# Patient Record
Sex: Female | Born: 1980 | Race: White | Hispanic: Yes | Marital: Married | Smoking: Former smoker
Health system: Southern US, Community
[De-identification: ages and names within clinical notes are randomized; demographics above are authoritative.]

## PROBLEM LIST (undated history)

## (undated) DIAGNOSIS — K219 Gastro-esophageal reflux disease without esophagitis: Secondary | ICD-10-CM

## (undated) HISTORY — PX: ABDOMINAL HYSTERECTOMY: SHX81

## (undated) HISTORY — PX: OTHER SURGICAL HISTORY: SHX169

## (undated) HISTORY — PX: APPENDECTOMY: SHX54

## (undated) HISTORY — PX: TONSILLECTOMY: SUR1361

---

## 2003-01-28 ENCOUNTER — Emergency Department (HOSPITAL_COMMUNITY): Admission: EM | Admit: 2003-01-28 | Discharge: 2003-01-28 | Payer: Self-pay | Admitting: Internal Medicine

## 2004-09-16 ENCOUNTER — Emergency Department (HOSPITAL_COMMUNITY): Admission: EM | Admit: 2004-09-16 | Discharge: 2004-09-17 | Payer: Self-pay | Admitting: Emergency Medicine

## 2004-10-20 ENCOUNTER — Emergency Department (HOSPITAL_COMMUNITY): Admission: EM | Admit: 2004-10-20 | Discharge: 2004-10-20 | Payer: Self-pay | Admitting: Emergency Medicine

## 2005-03-26 ENCOUNTER — Emergency Department (HOSPITAL_COMMUNITY): Admission: EM | Admit: 2005-03-26 | Discharge: 2005-03-27 | Payer: Self-pay | Admitting: Emergency Medicine

## 2006-07-19 ENCOUNTER — Emergency Department (HOSPITAL_COMMUNITY): Admission: EM | Admit: 2006-07-19 | Discharge: 2006-07-19 | Payer: Self-pay | Admitting: Emergency Medicine

## 2006-07-26 ENCOUNTER — Emergency Department (HOSPITAL_COMMUNITY): Admission: EM | Admit: 2006-07-26 | Discharge: 2006-07-27 | Payer: Self-pay | Admitting: Emergency Medicine

## 2006-08-10 ENCOUNTER — Emergency Department (HOSPITAL_COMMUNITY): Admission: EM | Admit: 2006-08-10 | Discharge: 2006-08-11 | Payer: Self-pay | Admitting: Emergency Medicine

## 2006-08-11 ENCOUNTER — Emergency Department (HOSPITAL_COMMUNITY): Admission: EM | Admit: 2006-08-11 | Discharge: 2006-08-11 | Payer: Self-pay | Admitting: Emergency Medicine

## 2007-04-19 ENCOUNTER — Ambulatory Visit: Payer: Self-pay | Admitting: Internal Medicine

## 2007-04-19 ENCOUNTER — Ambulatory Visit: Payer: Self-pay | Admitting: *Deleted

## 2007-04-20 ENCOUNTER — Encounter (INDEPENDENT_AMBULATORY_CARE_PROVIDER_SITE_OTHER): Payer: Self-pay | Admitting: Internal Medicine

## 2007-09-20 ENCOUNTER — Emergency Department (HOSPITAL_COMMUNITY): Admission: EM | Admit: 2007-09-20 | Discharge: 2007-09-21 | Payer: Self-pay | Admitting: Emergency Medicine

## 2007-11-28 ENCOUNTER — Emergency Department (HOSPITAL_COMMUNITY): Admission: EM | Admit: 2007-11-28 | Discharge: 2007-11-29 | Payer: Self-pay | Admitting: Emergency Medicine

## 2007-12-22 ENCOUNTER — Emergency Department (HOSPITAL_COMMUNITY): Admission: EM | Admit: 2007-12-22 | Discharge: 2007-12-22 | Payer: Self-pay | Admitting: Emergency Medicine

## 2008-02-21 ENCOUNTER — Emergency Department (HOSPITAL_COMMUNITY): Admission: EM | Admit: 2008-02-21 | Discharge: 2008-02-22 | Payer: Self-pay | Admitting: Emergency Medicine

## 2008-07-14 ENCOUNTER — Emergency Department (HOSPITAL_COMMUNITY): Admission: EM | Admit: 2008-07-14 | Discharge: 2008-07-14 | Payer: Self-pay | Admitting: Emergency Medicine

## 2008-08-21 ENCOUNTER — Emergency Department (HOSPITAL_COMMUNITY): Admission: EM | Admit: 2008-08-21 | Discharge: 2008-08-21 | Payer: Self-pay | Admitting: Emergency Medicine

## 2009-03-26 ENCOUNTER — Emergency Department (HOSPITAL_COMMUNITY): Admission: EM | Admit: 2009-03-26 | Discharge: 2009-03-26 | Payer: Self-pay | Admitting: Family Medicine

## 2009-05-17 ENCOUNTER — Inpatient Hospital Stay (HOSPITAL_COMMUNITY): Admission: EM | Admit: 2009-05-17 | Discharge: 2009-05-21 | Payer: Self-pay | Admitting: Emergency Medicine

## 2009-05-17 ENCOUNTER — Ambulatory Visit: Payer: Self-pay | Admitting: Internal Medicine

## 2009-05-19 ENCOUNTER — Encounter: Payer: Self-pay | Admitting: Internal Medicine

## 2009-05-20 ENCOUNTER — Encounter (INDEPENDENT_AMBULATORY_CARE_PROVIDER_SITE_OTHER): Payer: Self-pay | Admitting: Internal Medicine

## 2009-05-31 ENCOUNTER — Encounter (INDEPENDENT_AMBULATORY_CARE_PROVIDER_SITE_OTHER): Payer: Self-pay | Admitting: Adult Health

## 2009-05-31 ENCOUNTER — Ambulatory Visit: Payer: Self-pay | Admitting: Family Medicine

## 2009-05-31 LAB — CONVERTED CEMR LAB
ALT: <8 units/L (ref 0–35)
AST: 12 units/L (ref 0–37)
BUN: 11 mg/dL (ref 6–23)
Basophils Relative: 1 % (ref 0–1)
CO2: 25 meq/L (ref 19–32)
Calcium: 9.3 mg/dL (ref 8.4–10.5)
Chloride: 102 meq/L (ref 96–112)
Creatinine, Ser: 1.03 mg/dL (ref 0.40–1.20)
Eosinophils Absolute: 0.1 10*3/uL (ref 0.0–0.7)
Eosinophils Relative: 1 % (ref 0–5)
HCT: 37.1 % (ref 36.0–46.0)
Lymphs Abs: 2.4 10*3/uL (ref 0.7–4.0)
MCHC: 31.8 g/dL (ref 30.0–36.0)
MCV: 84.7 fL (ref 78.0–100.0)
Neutrophils Relative %: 63 % (ref 43–77)
Platelets: 380 10*3/uL (ref 150–400)
Total Bilirubin: 0.3 mg/dL (ref 0.3–1.2)

## 2009-06-01 ENCOUNTER — Encounter (INDEPENDENT_AMBULATORY_CARE_PROVIDER_SITE_OTHER): Payer: Self-pay | Admitting: Adult Health

## 2009-08-18 ENCOUNTER — Emergency Department (HOSPITAL_COMMUNITY): Admission: EM | Admit: 2009-08-18 | Discharge: 2009-08-18 | Payer: Self-pay | Admitting: Emergency Medicine

## 2010-09-07 ENCOUNTER — Emergency Department (HOSPITAL_COMMUNITY)
Admission: EM | Admit: 2010-09-07 | Discharge: 2010-09-07 | Payer: Self-pay | Source: Home / Self Care | Admitting: Emergency Medicine

## 2010-09-08 ENCOUNTER — Emergency Department (HOSPITAL_COMMUNITY)
Admission: EM | Admit: 2010-09-08 | Discharge: 2010-09-08 | Payer: Self-pay | Source: Home / Self Care | Admitting: Emergency Medicine

## 2010-10-11 ENCOUNTER — Encounter (INDEPENDENT_AMBULATORY_CARE_PROVIDER_SITE_OTHER): Payer: Self-pay | Admitting: *Deleted

## 2010-12-04 ENCOUNTER — Emergency Department (HOSPITAL_COMMUNITY)
Admission: EM | Admit: 2010-12-04 | Discharge: 2010-12-04 | Disposition: A | Discharge status: Home / Self Care | Payer: Self-pay | Attending: Emergency Medicine | Admitting: Emergency Medicine

## 2010-12-04 DIAGNOSIS — M545 Low back pain, unspecified: Secondary | ICD-10-CM | POA: Insufficient documentation

## 2010-12-04 DIAGNOSIS — R350 Frequency of micturition: Secondary | ICD-10-CM | POA: Insufficient documentation

## 2010-12-04 DIAGNOSIS — M549 Dorsalgia, unspecified: Secondary | ICD-10-CM | POA: Insufficient documentation

## 2010-12-04 LAB — URINALYSIS, ROUTINE W REFLEX MICROSCOPIC
Nitrite: NEGATIVE
Specific Gravity, Urine: 1.02 (ref 1.005–1.030)
Urobilinogen, UA: 0.2 mg/dL (ref 0.0–1.0)
pH: 5.5 (ref 5.0–8.0)

## 2010-12-04 LAB — POCT I-STAT, CHEM 8
Calcium, Ion: 1.11 mmol/L — ABNORMAL LOW (ref 1.12–1.32)
Creatinine, Ser: 0.9 mg/dL (ref 0.4–1.2)
Glucose, Bld: 95 mg/dL (ref 70–99)
HCT: 41 % (ref 36.0–46.0)
Hemoglobin: 13.9 g/dL (ref 12.0–15.0)
TCO2: 24 mmol/L (ref 0–100)

## 2010-12-04 LAB — CBC
HCT: 41.3 % (ref 36.0–46.0)
MCHC: 32.9 g/dL (ref 30.0–36.0)
Platelets: 228 10*3/uL (ref 150–400)
RDW: 12.8 % (ref 11.5–15.5)
WBC: 6 10*3/uL (ref 4.0–10.5)

## 2010-12-04 LAB — DIFFERENTIAL
Basophils Absolute: 0 10*3/uL (ref 0.0–0.1)
Basophils Relative: 0 % (ref 0–1)
Eosinophils Absolute: 0.1 10*3/uL (ref 0.0–0.7)
Eosinophils Relative: 2 % (ref 0–5)
Monocytes Absolute: 0.5 10*3/uL (ref 0.1–1.0)

## 2010-12-04 LAB — URINE MICROSCOPIC-ADD ON

## 2010-12-04 LAB — WET PREP, GENITAL
Trich, Wet Prep: NONE SEEN
Yeast Wet Prep HPF POC: NONE SEEN

## 2010-12-04 LAB — POCT PREGNANCY, URINE: Preg Test, Ur: NEGATIVE

## 2010-12-05 LAB — GC/CHLAMYDIA PROBE AMP, GENITAL: Chlamydia, DNA Probe: NEGATIVE

## 2010-12-10 LAB — URINALYSIS, ROUTINE W REFLEX MICROSCOPIC
Bilirubin Urine: NEGATIVE
Glucose, UA: NEGATIVE mg/dL
Nitrite: NEGATIVE
Specific Gravity, Urine: 1.024 (ref 1.005–1.030)
pH: 6.5 (ref 5.0–8.0)

## 2010-12-10 LAB — URINE MICROSCOPIC-ADD ON

## 2010-12-10 LAB — WET PREP, GENITAL

## 2010-12-10 LAB — POCT PREGNANCY, URINE: Preg Test, Ur: NEGATIVE

## 2010-12-13 LAB — BASIC METABOLIC PANEL
BUN: 14 mg/dL (ref 6–23)
BUN: 2 mg/dL — ABNORMAL LOW (ref 6–23)
BUN: 4 mg/dL — ABNORMAL LOW (ref 6–23)
CO2: 25 mEq/L (ref 19–32)
CO2: 26 mEq/L (ref 19–32)
CO2: 26 mEq/L (ref 19–32)
Calcium: 7.5 mg/dL — ABNORMAL LOW (ref 8.4–10.5)
Calcium: 7.6 mg/dL — ABNORMAL LOW (ref 8.4–10.5)
Calcium: 7.8 mg/dL — ABNORMAL LOW (ref 8.4–10.5)
Calcium: 7.9 mg/dL — ABNORMAL LOW (ref 8.4–10.5)
Chloride: 105 mEq/L (ref 96–112)
Chloride: 107 mEq/L (ref 96–112)
Chloride: 109 mEq/L (ref 96–112)
Chloride: 110 mEq/L (ref 96–112)
Creatinine, Ser: 0.7 mg/dL (ref 0.4–1.2)
Creatinine, Ser: 0.71 mg/dL (ref 0.4–1.2)
Creatinine, Ser: 1.08 mg/dL (ref 0.4–1.2)
Creatinine, Ser: 1.21 mg/dL — ABNORMAL HIGH (ref 0.4–1.2)
GFR calc Af Amer: >60 mL/min (ref >60)
GFR calc Af Amer: >60 mL/min (ref >60)
GFR calc non Af Amer: 60 mL/min — ABNORMAL LOW (ref >60)
GFR calc non Af Amer: >60 mL/min (ref >60)
GFR calc non Af Amer: >60 mL/min (ref >60)
Glucose, Bld: 103 mg/dL — ABNORMAL HIGH (ref 70–99)
Glucose, Bld: 103 mg/dL — ABNORMAL HIGH (ref 70–99)
Glucose, Bld: 107 mg/dL — ABNORMAL HIGH (ref 70–99)
Glucose, Bld: 123 mg/dL — ABNORMAL HIGH (ref 70–99)
Glucose, Bld: 138 mg/dL — ABNORMAL HIGH (ref 70–99)
Potassium: 2.8 mEq/L — ABNORMAL LOW (ref 3.5–5.1)
Potassium: 3.4 mEq/L — ABNORMAL LOW (ref 3.5–5.1)
Sodium: 136 mEq/L (ref 135–145)
Sodium: 137 mEq/L (ref 135–145)
Sodium: 140 mEq/L (ref 135–145)
Sodium: 142 mEq/L (ref 135–145)

## 2010-12-13 LAB — RETICULOCYTES
RBC.: 4.29 MIL/uL (ref 3.87–5.11)
Retic Count, Absolute: 17.2 10*3/uL — ABNORMAL LOW (ref 19.0–186.0)

## 2010-12-13 LAB — BLOOD GAS, ARTERIAL
Acid-Base Excess: 0.3 mmol/L (ref 0.0–2.0)
Bicarbonate: 23.8 mEq/L (ref 20.0–24.0)
FIO2: 0.21 %
O2 Saturation: 96.4 %
Patient temperature: 98.6
TCO2: 24.9 mmol/L (ref 0–100)
pCO2 arterial: 34.6 mmHg — ABNORMAL LOW (ref 35.0–45.0)
pH, Arterial: 7.451 — ABNORMAL HIGH (ref 7.350–7.400)
pO2, Arterial: 80.2 mmHg (ref 80.0–100.0)

## 2010-12-13 LAB — COMPREHENSIVE METABOLIC PANEL
ALT: <8 U/L (ref 0–35)
Albumin: 2.2 g/dL — ABNORMAL LOW (ref 3.5–5.2)
Alkaline Phosphatase: 55 U/L (ref 39–117)
BUN: 3 mg/dL — ABNORMAL LOW (ref 6–23)
Calcium: 8.5 mg/dL (ref 8.4–10.5)
Glucose, Bld: 89 mg/dL (ref 70–99)
Potassium: 3.3 mEq/L — ABNORMAL LOW (ref 3.5–5.1)
Sodium: 139 mEq/L (ref 135–145)
Total Protein: 6.4 g/dL (ref 6.0–8.3)

## 2010-12-13 LAB — CARDIAC PANEL(CRET KIN+CKTOT+MB+TROPI)
CK, MB: 0.4 ng/mL (ref 0.3–4.0)
CK, MB: 0.4 ng/mL (ref 0.3–4.0)
CK, MB: 0.5 ng/mL (ref 0.3–4.0)
CK, MB: 0.5 ng/mL (ref 0.3–4.0)
Relative Index: INVALID (ref 0.0–2.5)
Total CK: 27 U/L (ref 7–177)
Total CK: 32 U/L (ref 7–177)
Total CK: 33 U/L (ref 7–177)
Total CK: 36 U/L (ref 7–177)
Troponin I: 0.02 ng/mL (ref 0.00–0.06)
Troponin I: 0.03 ng/mL (ref 0.00–0.06)

## 2010-12-13 LAB — APTT: aPTT: 37 seconds (ref 24–37)

## 2010-12-13 LAB — CULTURE, BLOOD (ROUTINE X 2)
Culture: NO GROWTH
Culture: NO GROWTH

## 2010-12-13 LAB — IRON AND TIBC: Iron: <10 ug/dL — ABNORMAL LOW (ref 42–135)

## 2010-12-13 LAB — CBC
HCT: 26.7 % — ABNORMAL LOW (ref 36.0–46.0)
HCT: 33.3 % — ABNORMAL LOW (ref 36.0–46.0)
Hemoglobin: 10.6 g/dL — ABNORMAL LOW (ref 12.0–15.0)
Hemoglobin: 11.4 g/dL — ABNORMAL LOW (ref 12.0–15.0)
Hemoglobin: 9 g/dL — ABNORMAL LOW (ref 12.0–15.0)
MCHC: 33.6 g/dL (ref 30.0–36.0)
MCHC: 33.9 g/dL (ref 30.0–36.0)
MCHC: 34 g/dL (ref 30.0–36.0)
MCHC: 34.3 g/dL (ref 30.0–36.0)
MCV: 82.2 fL (ref 78.0–100.0)
MCV: 82.7 fL (ref 78.0–100.0)
MCV: 83.1 fL (ref 78.0–100.0)
Platelets: 163 10*3/uL (ref 150–400)
Platelets: 216 10*3/uL (ref 150–400)
Platelets: 367 10*3/uL (ref 150–400)
RBC: 3.22 MIL/uL — ABNORMAL LOW (ref 3.87–5.11)
RBC: 4.02 MIL/uL (ref 3.87–5.11)
RDW: 13.1 % (ref 11.5–15.5)
RDW: 13.3 % (ref 11.5–15.5)
RDW: 13.3 % (ref 11.5–15.5)
RDW: 13.4 % (ref 11.5–15.5)
RDW: 13.7 % (ref 11.5–15.5)
WBC: 6.8 10*3/uL (ref 4.0–10.5)
WBC: 6.9 10*3/uL (ref 4.0–10.5)

## 2010-12-13 LAB — ANTIPHOSPHOLIPID SYNDROME EVAL, BLD
Anticardiolipin IgG: <10 GPL U/mL — ABNORMAL LOW (ref <10)
DRVVT: 59.5 secs — ABNORMAL HIGH (ref 34.7–40.5)
Drvvt confirmation: 1.46 Ratio — ABNORMAL HIGH (ref <1.18)
Lupus Anticoagulant: DETECTED — AB
PTTLA 4:1 Mix: 48.3 secs (ref 36.3–48.8)
PTTLA Confirmation: 14.2 secs — ABNORMAL HIGH (ref <8.0)
dRVVT Incubated 1:1 Mix: 42.2 secs (ref 36.1–47.0)

## 2010-12-13 LAB — URINE CULTURE

## 2010-12-13 LAB — DIFFERENTIAL
Basophils Absolute: 0 10*3/uL (ref 0.0–0.1)
Basophils Relative: 0 % (ref 0–1)
Lymphocytes Relative: 12 % (ref 12–46)
Monocytes Absolute: 0.3 10*3/uL (ref 0.1–1.0)
Neutro Abs: 5.3 10*3/uL (ref 1.7–7.7)
Neutrophils Relative %: 83 % — ABNORMAL HIGH (ref 43–77)

## 2010-12-13 LAB — HEPATIC FUNCTION PANEL
Bilirubin, Direct: 0.1 mg/dL (ref 0.0–0.3)
Indirect Bilirubin: 0.1 mg/dL — ABNORMAL LOW (ref 0.3–0.9)
Total Protein: 5.2 g/dL — ABNORMAL LOW (ref 6.0–8.3)

## 2010-12-13 LAB — TSH: TSH: 1.542 u[IU]/mL (ref 0.350–4.500)

## 2010-12-13 LAB — HEMOGLOBIN A1C: Mean Plasma Glucose: 108 mg/dL

## 2010-12-13 LAB — RAPID URINE DRUG SCREEN, HOSP PERFORMED
Amphetamines: NOT DETECTED
Barbiturates: NOT DETECTED
Benzodiazepines: NOT DETECTED
Opiates: NOT DETECTED

## 2010-12-13 LAB — URINALYSIS, ROUTINE W REFLEX MICROSCOPIC
Bilirubin Urine: NEGATIVE
Ketones, ur: NEGATIVE mg/dL
Nitrite: POSITIVE — AB
Urobilinogen, UA: 1 mg/dL (ref 0.0–1.0)

## 2010-12-13 LAB — PROTIME-INR
INR: 1.1 (ref 0.00–1.49)
Prothrombin Time: 13.7 seconds (ref 11.6–15.2)

## 2010-12-13 LAB — D-DIMER, QUANTITATIVE: D-Dimer, Quant: 3.95 ug/mL-FEU — ABNORMAL HIGH (ref 0.00–0.48)

## 2010-12-13 LAB — PROTEIN C, TOTAL: Protein C, Total: 78 % (ref 70–140)

## 2010-12-13 LAB — FERRITIN: Ferritin: 363 ng/mL — ABNORMAL HIGH (ref 10–291)

## 2010-12-13 LAB — PROTEIN S, TOTAL: Protein S Ag, Total: 100 % (ref 70–140)

## 2010-12-13 LAB — ANTITHROMBIN III: AntiThromb III Func: 119 % (ref 76–126)

## 2010-12-13 LAB — URINE MICROSCOPIC-ADD ON

## 2010-12-13 LAB — HOMOCYSTEINE: Homocysteine: 11 umol/L (ref 4.0–15.4)

## 2011-01-07 ENCOUNTER — Inpatient Hospital Stay (HOSPITAL_COMMUNITY)
Admission: EM | Admit: 2011-01-07 | Discharge: 2011-01-08 | DRG: 343 | Disposition: A | Discharge status: Home / Self Care | Payer: Self-pay | Attending: Surgery | Admitting: Surgery

## 2011-01-07 ENCOUNTER — Other Ambulatory Visit: Payer: Self-pay | Admitting: Surgery

## 2011-01-07 ENCOUNTER — Emergency Department (HOSPITAL_COMMUNITY): Payer: Self-pay

## 2011-01-07 DIAGNOSIS — K3580 Unspecified acute appendicitis: Principal | ICD-10-CM | POA: Diagnosis present

## 2011-01-07 DIAGNOSIS — F17200 Nicotine dependence, unspecified, uncomplicated: Secondary | ICD-10-CM | POA: Diagnosis present

## 2011-01-07 LAB — URINALYSIS, ROUTINE W REFLEX MICROSCOPIC
Ketones, ur: NEGATIVE mg/dL
Nitrite: NEGATIVE
Protein, ur: NEGATIVE mg/dL

## 2011-01-07 LAB — WET PREP, GENITAL
Clue Cells Wet Prep HPF POC: NONE SEEN
Trich, Wet Prep: NONE SEEN
Yeast Wet Prep HPF POC: NONE SEEN

## 2011-01-07 LAB — CBC
MCV: 83.6 fL (ref 78.0–100.0)
Platelets: 242 10*3/uL (ref 150–400)
RDW: 12.9 % (ref 11.5–15.5)
WBC: 15.8 10*3/uL — ABNORMAL HIGH (ref 4.0–10.5)

## 2011-01-07 LAB — DIFFERENTIAL
Basophils Absolute: 0 10*3/uL (ref 0.0–0.1)
Basophils Relative: 0 % (ref 0–1)
Eosinophils Absolute: 0.1 10*3/uL (ref 0.0–0.7)
Eosinophils Relative: 0 % (ref 0–5)
Lymphs Abs: 1.3 10*3/uL (ref 0.7–4.0)

## 2011-01-07 LAB — GC/CHLAMYDIA PROBE AMP, GENITAL
Chlamydia, DNA Probe: NEGATIVE
GC Probe Amp, Genital: NEGATIVE

## 2011-01-07 LAB — COMPREHENSIVE METABOLIC PANEL
Albumin: 3.5 g/dL (ref 3.5–5.2)
Alkaline Phosphatase: 71 U/L (ref 39–117)
BUN: 19 mg/dL (ref 6–23)
Potassium: 4 mEq/L (ref 3.5–5.1)
Total Protein: 6.3 g/dL (ref 6.0–8.3)

## 2011-01-07 LAB — POCT PREGNANCY, URINE: Preg Test, Ur: NEGATIVE

## 2011-01-07 MED ORDER — IOHEXOL 300 MG/ML  SOLN
100.0000 mL | Freq: Once | INTRAMUSCULAR | Status: AC | PRN
  Administered 2011-01-07: 100 mL via INTRAVENOUS

## 2011-01-08 NOTE — Op Note (Signed)
Jordan Ibarra, Jordan Ibarra              ACCOUNT NO.:  [id number]  MEDICAL RECORD NO.:  [id number]           PATIENT TYPE:  I  LOCATION:  5122                         FACILITY:  [facility]  PHYSICIAN:  Wilmon Arms. Corliss Skains, M.D. DATE OF BIRTH:  08/11/1981  DATE OF PROCEDURE:  01/07/2011 DATE OF DISCHARGE:                              OPERATIVE REPORT   PREOPERATIVE DIAGNOSIS:  Early acute appendicitis.  POSTOPERATIVE DIAGNOSIS:  Early acute appendicitis.  PROCEDURE:  Laparoscopic appendectomy.  SURGEON:  Wilmon Arms. Pao Haffey, MD  ANESTHESIA:  General.  INDICATIONS:  This is a 30 year old female in good health who presents with several hours of sudden right lower quadrant abdominal pain.  She denies any nausea or vomiting.  She has some localized peritonitis in her right lower quadrant.  CT scan showed some mild periappendiceal stranding, but otherwise normal-appearing appendix.  No free fluid or abscess was noted.  Due to her physical examination, we recommended laparoscopic appendectomy for early acute appendicitis.  OPERATIVE FINDINGS:  In the midportion of the appendix, there is a focal area of inflammation with slight swelling and erythema.  The colon appears normal.  The right ovary has a small cyst.  DESCRIPTION OF PROCEDURE:  The patient was brought to the operating room and placed in the supine position on the operating table.  She voided prior to coming to the operating room.  After an adequate level of general anesthesia was obtained, her abdomen was prepped with ChloraPrep and draped in a sterile fashion.  A time-out was taken to assure proper patient and proper procedure.  We infiltrated the area above her umbilicus with 0.25% Marcaine with epinephrine.  A transverse incision was made.  Dissection was carried down to the fascia.  The fascia was incised vertically.  We entered the peritoneal cavity bluntly.  A stay suture of Vicryl was placed around the fascial opening.  The  Hasson cannula was inserted and secured to stay suture.  Pneumoperitoneum was obtained by insufflating CO2 maintaining maximal pressure of 15 mmHg. The laparoscope was inserted and the patient was positioned in Trendelenburg tilted to the left.  A 5-mm port was placed in the right upper quadrant and another 5-mm port was placed in her left lower quadrant.  We removed the scope through the right upper quadrant port site.  The cecum was mobilized medially and the appendix was identified. When we elevated the appendix, there was some focal area of inflammation in the midportion of the appendix.  There was no purulence or sign of perforation.  There was a little bit of localized erythema on the peritoneum medially adjacent to this area.  We mobilized the mesoappendix and divided it with the harmonic scalpel.  Once we cleared the appendix all the way down its base at the cecum, we divided this with Endo-GIA blue load stapler.  The appendix was placed in an Endocatch sac.  There was a small bleeding vessel which was cauterized with the harmonic scalpel.  The appendix was then removed.  The right lower quadrant was irrigated.  Hemostasis was good.  There was a band of adhesions down to the right fallopian  tube where she had a previous tubal ligation.  We lysed this band of adhesions.  The right ovary had a very small cyst on it.  There was no other inflammation in the right lower quadrant.  We removed the trocars and pneumoperitoneum was released.  The purse-string sutures were used to close umbilical fascia.  4-0 Monocryl was used to close the skin incisions.  Steri-Strips and clean dressings were applied.  The patient was then extubated and brought to the recovery room in stable condition. All sponge, instrument, and needle counts were correct.     Wilmon Arms. Corliss Skains, M.D.     MKT/MEDQ  D:  01/07/2011  T:  01/07/2011  Job:  [id number]  Electronically Signed by Manus Rudd M.D. on  01/08/2011 02:23:11 PM

## 2011-01-08 NOTE — H&P (Signed)
Jordan Ibarra, Jordan Ibarra              ACCOUNT NO.:  [id number]  MEDICAL RECORD NO.:  [id number]           PATIENT TYPE:  E  LOCATION:  [facility]                         FACILITY:  [facility]  PHYSICIAN:  Abigail Miyamoto, M.D. DATE OF BIRTH:  [birth year]  DATE OF ADMISSION:  01/07/2011 DATE OF DISCHARGE:                             HISTORY & PHYSICAL   CHIEF COMPLAINT:  Right lower quadrant abdominal pain.  HISTORY:  This is a 30 year old female presents to the emergency department with sudden onset of sharp right lower quadrant abdominal pain.  She was getting up to put on her pajamas, when she had the sudden onset of pain, and once she put her leg down, it hurt so much and she fell over.  She report that was first felt like gas pain, but became more sharp and as it continued to persist.  She presented to the emergency department.  She denies any nausea or vomiting.  She has had ovarian cyst in the past, but this is not similar to that.  Otherwise, she is without complaints.  PAST MEDICAL HISTORY:  Ovarian cysts and urinary tract infection.  PAST SURGICAL HISTORY:  Tubal ligation and tonsils.  MEDICATIONS:  None.  ALLERGIES:  NO KNOWN DRUG ALLERGIES.  FAMILY HISTORY:  Positive for hypertension and cancer.  SOCIAL HISTORY:  Socially, she does not drink alcohol.  She does smoke. She has used cocaine in the past, last time was 13 months ago.  REVIEW OF SYSTEMS:  GENERAL:  Negative for fever, chills.  PULMONARY: Negative for cough, shortness of breath, or difficulty breathing. CARDIAC:  Negative for chest pain or irregular heartbeat.  ABDOMEN:  As listed as above.  There is no diarrhea.  There is no nausea, vomiting, or hematemesis.  URINARY:  Negative for dysuria or hematuria.  The rest of review of systems including skin, eyes, ears, nose, and throat, musculoskeletal, neurologic, psychiatric, endocrine are normal.  PHYSICAL EXAMINATION:  GENERAL:  This is an obese female, in no  acute distress. VITAL SIGNS:  Temperature 98, pulse 87, respiratory rate 18, blood pressure 120/80, he is saturating 100% on room air. HEENT:  Eyes anicteric.  Pupils reactive bilaterally.  ENT:  External ears and nose normal.  Hearing is normal.  Oropharynx clear. NECK:  Supple.  Trachea is midline.  There is no thyromegaly. LUNGS:  Clear to auscultation bilaterally with normal respiratory effort. CARDIOVASCULAR:  Regular rate and rhythm.  There are no murmurs.  There is no peripheral edema. ABDOMEN:  Soft.  There is tenderness with guarding, which is mild-to- moderate in the right lower quadrant suprapubic area.  The rest of the abdomen is soft and nontender.  There are no hernias.  There is no organomegaly. EXTREMITIES:  Warm and well perfused.  No edema, clubbing and cyanosis. Peripheral pulses are intact in all 4 extremities. MUSCULOSKELETAL:  Shows no normal motor and sensory function in all 4 extremities. NEUROLOGIC:  Shows her to be awake, alert and oriented. PSYCHIATRIC:  Shows judgment and affect are normal.  DATA REVIEWED:  The patient's CBC showed white blood count of 15.8, hemoglobin is 12.2, platelets are 242.  There is a left shift.  CMP shows a creatinine of 0.73.  Liver function tests are normal.  The patient has had a wet prep showing numerous white blood cells.  There are no other abnormalities.  Urinalysis is negative.  Urine pregnancy test is negative.  The patient has a CAT scan of the abdomen and pelvis which shows a morphologically normal-appearing appendix, but mild inflammatory changes in the periappendiceal fat.  IMPRESSION:  This is a patient with abdominal pain of uncertain etiology.  I do suspect this is early acute appendicitis based on her physical examination and the CAT scan.  She also does have an elevated white blood count.  At this point, she is being admitted to the hospital.  IV antibiotics had been given.  I will discuss this with my partner  who is coming on and we will discuss whether to proceed to operating for diagnostic laparoscopy and laparoscopic appendectomy.     Abigail Miyamoto, M.D.     DB/MEDQ  D:  01/07/2011  T:  01/07/2011  Job:  [id number]  Electronically Signed by Abigail Miyamoto M.D. on 01/08/2011 01:56:48 PM

## 2011-01-09 ENCOUNTER — Emergency Department (HOSPITAL_COMMUNITY)
Admission: EM | Admit: 2011-01-09 | Discharge: 2011-01-09 | Disposition: A | Discharge status: Home / Self Care | Payer: Self-pay | Attending: Emergency Medicine | Admitting: Emergency Medicine

## 2011-01-09 DIAGNOSIS — Z09 Encounter for follow-up examination after completed treatment for conditions other than malignant neoplasm: Secondary | ICD-10-CM | POA: Insufficient documentation

## 2011-04-14 ENCOUNTER — Emergency Department (HOSPITAL_COMMUNITY)
Admission: EM | Admit: 2011-04-14 | Discharge: 2011-04-15 | Disposition: A | Discharge status: Home / Self Care | Payer: Self-pay | Attending: Emergency Medicine | Admitting: Emergency Medicine

## 2011-04-14 DIAGNOSIS — R0602 Shortness of breath: Secondary | ICD-10-CM | POA: Insufficient documentation

## 2011-04-14 DIAGNOSIS — M549 Dorsalgia, unspecified: Secondary | ICD-10-CM | POA: Insufficient documentation

## 2011-04-15 ENCOUNTER — Emergency Department (HOSPITAL_COMMUNITY): Payer: Self-pay

## 2011-04-15 LAB — URINALYSIS, ROUTINE W REFLEX MICROSCOPIC
Leukocytes, UA: NEGATIVE
Nitrite: NEGATIVE
Specific Gravity, Urine: 1.037 — ABNORMAL HIGH (ref 1.005–1.030)
Urobilinogen, UA: 0.2 mg/dL (ref 0.0–1.0)

## 2011-04-15 LAB — D-DIMER, QUANTITATIVE: D-Dimer, Quant: 0.3 ug/mL-FEU (ref 0.00–0.48)

## 2011-04-15 LAB — DIFFERENTIAL
Basophils Absolute: 0.1 10*3/uL (ref 0.0–0.1)
Basophils Relative: 1 % (ref 0–1)
Neutro Abs: 5.8 10*3/uL (ref 1.7–7.7)
Neutrophils Relative %: 62 % (ref 43–77)

## 2011-04-15 LAB — BASIC METABOLIC PANEL
CO2: 30 mEq/L (ref 19–32)
Calcium: 9.4 mg/dL (ref 8.4–10.5)
Chloride: 102 mEq/L (ref 96–112)
GFR calc Af Amer: >60 mL/min (ref >60)
Sodium: 138 mEq/L (ref 135–145)

## 2011-04-15 LAB — CBC
Hemoglobin: 13.2 g/dL (ref 12.0–15.0)
RBC: 4.66 MIL/uL (ref 3.87–5.11)

## 2011-05-29 LAB — WET PREP, GENITAL: Yeast Wet Prep HPF POC: NONE SEEN

## 2011-05-29 LAB — URINE CULTURE

## 2011-05-29 LAB — URINALYSIS, ROUTINE W REFLEX MICROSCOPIC
Bilirubin Urine: NEGATIVE
Glucose, UA: NEGATIVE
Ketones, ur: NEGATIVE
Protein, ur: NEGATIVE

## 2011-05-29 LAB — GC/CHLAMYDIA PROBE AMP, GENITAL
Chlamydia, DNA Probe: POSITIVE — AB
GC Probe Amp, Genital: NEGATIVE

## 2011-05-29 LAB — POCT PREGNANCY, URINE: Preg Test, Ur: NEGATIVE

## 2011-05-29 LAB — URINE MICROSCOPIC-ADD ON

## 2011-06-02 LAB — POCT PREGNANCY, URINE
Operator id: 277751
Preg Test, Ur: NEGATIVE

## 2011-06-02 LAB — POCT I-STAT, CHEM 8
BUN: 11
Chloride: 106
Creatinine, Ser: 1.1
Potassium: 3.4 — ABNORMAL LOW
Sodium: 140
TCO2: 26

## 2011-06-05 LAB — CBC
HCT: 35.6 — ABNORMAL LOW
Hemoglobin: 12.7
RBC: 4.25
WBC: 4.7

## 2011-06-05 LAB — SEDIMENTATION RATE: Sed Rate: 25 — ABNORMAL HIGH

## 2011-06-05 LAB — POCT I-STAT, CHEM 8
BUN: 19
Chloride: 104
HCT: 36
Sodium: 139

## 2011-06-05 LAB — DIFFERENTIAL
Lymphocytes Relative: 22
Lymphs Abs: 1
Neutro Abs: 3
Neutrophils Relative %: 64

## 2011-06-10 LAB — POCT I-STAT, CHEM 8
BUN: 11
Creatinine, Ser: 0.9
Hemoglobin: 12.2
Potassium: 4.3
Sodium: 140

## 2011-06-10 LAB — D-DIMER, QUANTITATIVE: D-Dimer, Quant: 0.68 — ABNORMAL HIGH

## 2011-06-13 ENCOUNTER — Emergency Department (HOSPITAL_COMMUNITY)
Admission: EM | Admit: 2011-06-13 | Discharge: 2011-06-14 | Discharge status: Against Medical Advice | Payer: Self-pay | Attending: Emergency Medicine | Admitting: Emergency Medicine

## 2011-06-13 DIAGNOSIS — R109 Unspecified abdominal pain: Secondary | ICD-10-CM | POA: Insufficient documentation

## 2011-06-14 ENCOUNTER — Emergency Department (HOSPITAL_COMMUNITY)
Admission: EM | Admit: 2011-06-14 | Discharge: 2011-06-14 | Discharge status: Against Medical Advice | Payer: Self-pay | Attending: Emergency Medicine | Admitting: Emergency Medicine

## 2011-06-14 ENCOUNTER — Emergency Department (HOSPITAL_COMMUNITY): Payer: Self-pay

## 2011-06-14 DIAGNOSIS — R110 Nausea: Secondary | ICD-10-CM | POA: Insufficient documentation

## 2011-06-14 DIAGNOSIS — R109 Unspecified abdominal pain: Secondary | ICD-10-CM | POA: Insufficient documentation

## 2011-06-14 DIAGNOSIS — N739 Female pelvic inflammatory disease, unspecified: Secondary | ICD-10-CM | POA: Insufficient documentation

## 2011-06-14 LAB — DIFFERENTIAL
Basophils Absolute: 0.1 10*3/uL (ref 0.0–0.1)
Basophils Relative: 1 % (ref 0–1)
Eosinophils Absolute: 0.2 10*3/uL (ref 0.0–0.7)
Eosinophils Relative: 2 % (ref 0–5)
Lymphocytes Relative: 33 % (ref 12–46)
Monocytes Absolute: 0.6 10*3/uL (ref 0.1–1.0)

## 2011-06-14 LAB — URINALYSIS, ROUTINE W REFLEX MICROSCOPIC
Ketones, ur: NEGATIVE mg/dL
Leukocytes, UA: NEGATIVE
Nitrite: NEGATIVE
Specific Gravity, Urine: 1.036 — ABNORMAL HIGH (ref 1.005–1.030)
Urobilinogen, UA: 0.2 mg/dL (ref 0.0–1.0)
pH: 5.5 (ref 5.0–8.0)

## 2011-06-14 LAB — COMPREHENSIVE METABOLIC PANEL
Albumin: 4.1 g/dL (ref 3.5–5.2)
Alkaline Phosphatase: 83 U/L (ref 39–117)
BUN: 21 mg/dL (ref 6–23)
Calcium: 9.5 mg/dL (ref 8.4–10.5)
Creatinine, Ser: 0.73 mg/dL (ref 0.50–1.10)
GFR calc Af Amer: >90 mL/min (ref >90)
Glucose, Bld: 87 mg/dL (ref 70–99)
Total Protein: 7.2 g/dL (ref 6.0–8.3)

## 2011-06-14 LAB — WET PREP, GENITAL
Clue Cells Wet Prep HPF POC: NONE SEEN
Trich, Wet Prep: NONE SEEN
Yeast Wet Prep HPF POC: NONE SEEN

## 2011-06-14 LAB — LIPASE, BLOOD: Lipase: 23 U/L (ref 11–59)

## 2011-06-14 LAB — CBC
HCT: 37.8 % (ref 36.0–46.0)
MCHC: 34.4 g/dL (ref 30.0–36.0)
Platelets: 246 10*3/uL (ref 150–400)
RDW: 13.1 % (ref 11.5–15.5)
WBC: 7.8 10*3/uL (ref 4.0–10.5)

## 2011-06-14 LAB — POCT PREGNANCY, URINE: Preg Test, Ur: NEGATIVE

## 2011-06-16 LAB — GC/CHLAMYDIA PROBE AMP, GENITAL: GC Probe Amp, Genital: NEGATIVE

## 2012-05-19 ENCOUNTER — Emergency Department (HOSPITAL_COMMUNITY): Payer: Self-pay

## 2012-05-19 ENCOUNTER — Encounter (HOSPITAL_COMMUNITY): Payer: Self-pay | Admitting: *Deleted

## 2012-05-19 ENCOUNTER — Emergency Department (HOSPITAL_COMMUNITY)
Admission: EM | Admit: 2012-05-19 | Discharge: 2012-05-19 | Disposition: A | Discharge status: Home / Self Care | Payer: Self-pay | Attending: Emergency Medicine | Admitting: Emergency Medicine

## 2012-05-19 DIAGNOSIS — R42 Dizziness and giddiness: Secondary | ICD-10-CM | POA: Insufficient documentation

## 2012-05-19 DIAGNOSIS — R109 Unspecified abdominal pain: Secondary | ICD-10-CM

## 2012-05-19 DIAGNOSIS — Z9089 Acquired absence of other organs: Secondary | ICD-10-CM | POA: Insufficient documentation

## 2012-05-19 DIAGNOSIS — R1032 Left lower quadrant pain: Secondary | ICD-10-CM | POA: Insufficient documentation

## 2012-05-19 DIAGNOSIS — Z87891 Personal history of nicotine dependence: Secondary | ICD-10-CM | POA: Insufficient documentation

## 2012-05-19 LAB — CBC WITH DIFFERENTIAL/PLATELET
Basophils Absolute: 0 10*3/uL (ref 0.0–0.1)
Eosinophils Absolute: 0.1 10*3/uL (ref 0.0–0.7)
Eosinophils Relative: 1 % (ref 0–5)
Lymphocytes Relative: 30 % (ref 12–46)
Lymphs Abs: 3.1 10*3/uL (ref 0.7–4.0)
MCV: 84.5 fL (ref 78.0–100.0)
Neutrophils Relative %: 62 % (ref 43–77)
Platelets: 269 10*3/uL (ref 150–400)
RBC: 4.76 MIL/uL (ref 3.87–5.11)
RDW: 12.8 % (ref 11.5–15.5)
WBC: 10.4 10*3/uL (ref 4.0–10.5)

## 2012-05-19 LAB — COMPREHENSIVE METABOLIC PANEL
ALT: 8 U/L (ref 0–35)
AST: 14 U/L (ref 0–37)
Alkaline Phosphatase: 74 U/L (ref 39–117)
CO2: 26 mEq/L (ref 19–32)
Calcium: 9.6 mg/dL (ref 8.4–10.5)
Potassium: 4 mEq/L (ref 3.5–5.1)
Sodium: 139 mEq/L (ref 135–145)
Total Protein: 7.4 g/dL (ref 6.0–8.3)

## 2012-05-19 LAB — URINALYSIS, ROUTINE W REFLEX MICROSCOPIC
Glucose, UA: NEGATIVE mg/dL
Hgb urine dipstick: NEGATIVE
Leukocytes, UA: NEGATIVE
pH: 5 (ref 5.0–8.0)

## 2012-05-19 MED ORDER — KETOROLAC TROMETHAMINE 60 MG/2ML IM SOLN
60.0000 mg | Freq: Once | INTRAMUSCULAR | Status: AC
  Administered 2012-05-19: 60 mg via INTRAMUSCULAR
  Filled 2012-05-19: qty 2

## 2012-05-19 NOTE — ED Notes (Signed)
Pt states understanding of discharge instructions 

## 2012-05-19 NOTE — ED Notes (Signed)
PT is here with dizziness that started 1.5 weeks ago and then a couple of days later she was up on a ladder and started having pain in LLQ pain.  PT states everytime she eats she starts having pain.  Then develops nausea.  LMP- in August.  No vaginal bleeding or discharge, or urinary symptoms

## 2012-05-19 NOTE — ED Provider Notes (Signed)
History     CSN: [id number]  Arrival date & time 05/19/12  1709   First MD Initiated Contact with Patient 05/19/12 2131      Chief Complaint  Patient presents with  . Abdominal Pain    LLQ  . Dizziness    (Consider location/radiation/quality/duration/timing/severity/associated sxs/prior treatment) HPI Comments: The patient presents complaining of pain in her left lower quadrant for the past week.  The pain comes and goes.  There is no fever or chills.  She also reports dizziness for the past week as well.  She denies any injury or trauma.  Her LMP was last month and normal.  She denies vaginal discharge or bleeding.    Patient is a 31 y.o. female presenting with abdominal pain. The history is provided by the patient.  Abdominal Pain The primary symptoms of the illness include abdominal pain. Episode onset: one week ago. The onset of the illness was gradual. The problem has been gradually worsening.  The patient states that she believes she is currently not pregnant. The patient has not had a change in bowel habit. Symptoms associated with the illness do not include chills, urgency or frequency.    History reviewed. No pertinent past medical history.  Past Surgical History  Procedure Date  . Appendectomy     No family history on file.  History  Substance Use Topics  . Smoking status: Former Games developer  . Smokeless tobacco: Not on file  . Alcohol Use: No    OB History    Grav Para Term Preterm Abortions TAB SAB Ect Mult Living                  Review of Systems  Constitutional: Negative for chills.  Gastrointestinal: Positive for abdominal pain.  Genitourinary: Negative for urgency and frequency.  All other systems reviewed and are negative.    Allergies  Latex  Home Medications   Current Outpatient Rx  Name Route Sig Dispense Refill  . IBUPROFEN 200 MG PO TABS Oral Take 400 mg by mouth daily as needed. For pain      BP 115/77  Pulse 75  Temp 98.4 F (36.9  C) (Oral)  Resp 18  SpO2 97%  Physical Exam  Nursing note and vitals reviewed. Constitutional: She is oriented to person, place, and time. She appears well-developed and well-nourished. No distress.  HENT:  Head: Normocephalic and atraumatic.  Neck: Normal range of motion. Neck supple.  Cardiovascular: Normal rate and regular rhythm.  Exam reveals no gallop and no friction rub.   No murmur heard. Pulmonary/Chest: Effort normal and breath sounds normal. No respiratory distress. She has no wheezes.  Abdominal: Soft. Bowel sounds are normal. She exhibits no distension. There is tenderness.       There is ttp in the left lower quadrant without rebound or guarding.  The bowel sounds are normoactive.  Musculoskeletal: Normal range of motion.  Neurological: She is alert and oriented to person, place, and time.  Skin: Skin is warm and dry. She is not diaphoretic.    ED Course  Procedures (including critical care time)   Labs Reviewed  PREGNANCY, URINE  URINALYSIS, ROUTINE W REFLEX MICROSCOPIC  CBC WITH DIFFERENTIAL  COMPREHENSIVE METABOLIC PANEL  LIPASE, BLOOD   No results found.   No diagnosis found.    MDM  The patient presents here with dizziness and left sided abdominal pain.  The pain comes and goes and sounds like possible a kidney stone.  A ct was  performed as all other labs, including a ua and pregnancy test were unremarkable.  At this point, I doubt there is any emergent pathology and she appears very stable.  She will be discharged to home, to return prn if her symptoms recur or worsen.        Geoffery Lyons, MD 05/19/12 2330

## 2013-01-19 ENCOUNTER — Emergency Department (HOSPITAL_COMMUNITY): Payer: Self-pay

## 2013-01-19 ENCOUNTER — Encounter (HOSPITAL_COMMUNITY): Payer: Self-pay | Admitting: Emergency Medicine

## 2013-01-19 ENCOUNTER — Emergency Department (HOSPITAL_COMMUNITY)
Admission: EM | Admit: 2013-01-19 | Discharge: 2013-01-19 | Disposition: A | Discharge status: Home / Self Care | Payer: Self-pay | Attending: Emergency Medicine | Admitting: Emergency Medicine

## 2013-01-19 DIAGNOSIS — Z91040 Latex allergy status: Secondary | ICD-10-CM | POA: Insufficient documentation

## 2013-01-19 DIAGNOSIS — R5381 Other malaise: Secondary | ICD-10-CM | POA: Insufficient documentation

## 2013-01-19 DIAGNOSIS — IMO0001 Reserved for inherently not codable concepts without codable children: Secondary | ICD-10-CM | POA: Insufficient documentation

## 2013-01-19 DIAGNOSIS — R202 Paresthesia of skin: Secondary | ICD-10-CM

## 2013-01-19 DIAGNOSIS — R209 Unspecified disturbances of skin sensation: Secondary | ICD-10-CM | POA: Insufficient documentation

## 2013-01-19 DIAGNOSIS — Z8543 Personal history of malignant neoplasm of ovary: Secondary | ICD-10-CM | POA: Insufficient documentation

## 2013-01-19 DIAGNOSIS — M7989 Other specified soft tissue disorders: Secondary | ICD-10-CM | POA: Insufficient documentation

## 2013-01-19 DIAGNOSIS — M79641 Pain in right hand: Secondary | ICD-10-CM

## 2013-01-19 DIAGNOSIS — M79609 Pain in unspecified limb: Secondary | ICD-10-CM | POA: Insufficient documentation

## 2013-01-19 DIAGNOSIS — R509 Fever, unspecified: Secondary | ICD-10-CM | POA: Insufficient documentation

## 2013-01-19 DIAGNOSIS — Z87891 Personal history of nicotine dependence: Secondary | ICD-10-CM | POA: Insufficient documentation

## 2013-01-19 DIAGNOSIS — R5383 Other fatigue: Secondary | ICD-10-CM | POA: Insufficient documentation

## 2013-01-19 MED ORDER — ONDANSETRON HCL 4 MG/2ML IJ SOLN: 4.0000 mg | Freq: Once | INTRAMUSCULAR | Status: DC

## 2013-01-19 MED ORDER — OXYCODONE-ACETAMINOPHEN 5-325 MG PO TABS: 1.0000 | ORAL_TABLET | ORAL | Status: DC | PRN

## 2013-01-19 MED ORDER — ONDANSETRON 4 MG PO TBDP
4.0000 mg | ORAL_TABLET | Freq: Once | ORAL | Status: AC
  Administered 2013-01-19: 4 mg via ORAL
  Filled 2013-01-19: qty 1

## 2013-01-19 MED ORDER — MELOXICAM 15 MG PO TABS: 15.0000 mg | ORAL_TABLET | Freq: Every day | ORAL | Status: DC

## 2013-01-19 MED ORDER — OXYCODONE-ACETAMINOPHEN 5-325 MG PO TABS
2.0000 | ORAL_TABLET | Freq: Once | ORAL | Status: AC
  Administered 2013-01-19: 2 via ORAL
  Filled 2013-01-19: qty 2

## 2013-01-19 NOTE — ED Notes (Signed)
Skin warm dry intact, able to wiggle digits, cap refill less than 2 seconds. Pt states no sensation present in hands or fingers

## 2013-01-19 NOTE — ED Notes (Signed)
NAD noted at time of d/c home 

## 2013-01-19 NOTE — ED Provider Notes (Signed)
History     CSN: [id number]  Arrival date & time 01/19/13  0303   First MD Initiated Contact with Patient 01/19/13 [phone]      Chief Complaint  Patient presents with  . Hand Pain    (Consider location/radiation/quality/duration/timing/severity/associated sxs/prior treatment) HPI  32 year old female presents the emergency department with chief complaint of bilateral hand pain and swelling.  She has a past medical and family history of small cell ovarian cancer.  She has 2 sisters who died of the same.  The patient is status post total hysterectomy performed at [facility] in December of 2013.  Patient states that she has had 3 weeks of worsening tingling, swelling, stiffness and bilateral severe hand pain.  Patient states that her pain is worse in the morning.  Stiffness is unrelieved throughout the day.  She has been taking 600 mg ibuprofen every 6 hours without any relief of her symptoms.  She has also had associated subjective fever, malaise, and myalgias.  Denies pain or stiffness in any of her other joints.  She has an aunt who was recently diagnosed with rheumatoid arthritis.   Denies DOE, SOB, chest tightness or pressure, radiation to left arm, jaw or back, or diaphoresis. Denies dysuria, flank pain, suprapubic pain, frequency, urgency, or hematuria. Denies headaches, light headedness, weakness, visual disturbances. Denies abdominal pain, nausea, vomiting, diarrhea or constipation.   History reviewed. No pertinent past medical history.  Past Surgical History  Procedure Laterality Date  . Appendectomy    . Abdominal hysterectomy    . Tonsillectomy      No family history on file.  History  Substance Use Topics  . Smoking status: Former Games developer  . Smokeless tobacco: Not on file  . Alcohol Use: No    OB History   Grav Para Term Preterm Abortions TAB SAB Ect Mult Living                  Review of Systems Ten systems reviewed and are negative for acute change, except as noted in  the HPI.   Allergies  Latex  Home Medications   Current Outpatient Rx  Name  Route  Sig  Dispense  Refill  . ibuprofen (ADVIL,MOTRIN) 600 MG tablet   Oral   Take 600 mg by mouth every 6 (six) hours as needed for pain.           BP 122/72  Pulse 80  Temp(Src) 98.8 F (37.1 C) (Oral)  Resp 14  SpO2 96%  Physical Exam Physical Exam  Nursing note and vitals reviewed. Constitutional: She is oriented to person, place, and time. She appears well-developed and well-nourished. No distress.  HENT:  Head: Normocephalic and atraumatic.  Eyes: Conjunctivae normal and EOM are normal. Pupils are equal, round, and reactive to light. No scleral icterus.  Neck: Normal range of motion.  Cardiovascular: Normal rate, regular rhythm and normal heart sounds.  Exam reveals no gallop and no friction rub.   No murmur heard. Pulmonary/Chest: Effort normal and breath sounds normal. No respiratory distress.  Abdominal: Soft. Bowel sounds are normal. She exhibits no distension and no mass. There is no tenderness. There is no guarding.  Musculoskeletal: Patient with bilateral swelling and stiffness to both hands.  He and are warm to the touch.  There is note erythema.  Range of motion is severely limited due to stiffness and severe pain.  Sensation and radial pulses intact capillary refill less than 3 seconds.   Neurological: She is alert and oriented  to person, place, and time.  Skin: Skin is warm and dry. She is not diaphoretic.    ED Course  Procedures (including critical care time)  Labs Reviewed - No data to display No results found.   1. Bilateral hand pain   2. Hand swelling   3. Paresthesia       MDM  7:07 AM Filed Vitals:   01/19/13 0310  BP: 122/72  Pulse: 80  Temp: 98.8 F (37.1 C)  Resp: 14   Patient with severe pain.  Offered the patient oral Percocet which she accepts.  I sent the patient for x-rays of her hands.  I suspect inflammatory process such as rheumatoid  arthritis.  Do not feel that lab work is indicated at this time in the emergent setting.      8:33 AM Patient xrays negative for osseous abnormality.  I still suspect inflammatory arthritis process.  We'll discharge the patient with the Mobic and Percocet.  She will follow up with rheumatology.  I've advised the patient to call today to set up her appointment.  Arthor Captain, PA-C 01/19/13 [phone]

## 2013-01-19 NOTE — ED Notes (Signed)
PT. 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 SKIN INTACT / NO SWELLING .

## 2013-01-20 NOTE — ED Provider Notes (Signed)
Medical screening examination/treatment/procedure(s) were performed by non-physician practitioner and as supervising physician I was immediately available for consultation/collaboration.  Maanasa Aderhold M Micco Bourbeau, MD 01/20/13 0655 

## 2013-05-26 ENCOUNTER — Encounter (HOSPITAL_COMMUNITY): Payer: Self-pay | Admitting: *Deleted

## 2013-05-26 ENCOUNTER — Emergency Department (HOSPITAL_COMMUNITY)
Admission: EM | Admit: 2013-05-26 | Discharge: 2013-05-26 | Disposition: A | Discharge status: Home / Self Care | Payer: No Typology Code available for payment source | Attending: Emergency Medicine | Admitting: Emergency Medicine

## 2013-05-26 DIAGNOSIS — R51 Headache: Secondary | ICD-10-CM | POA: Insufficient documentation

## 2013-05-26 DIAGNOSIS — Z79899 Other long term (current) drug therapy: Secondary | ICD-10-CM | POA: Insufficient documentation

## 2013-05-26 DIAGNOSIS — Z87891 Personal history of nicotine dependence: Secondary | ICD-10-CM | POA: Insufficient documentation

## 2013-05-26 DIAGNOSIS — Z792 Long term (current) use of antibiotics: Secondary | ICD-10-CM | POA: Insufficient documentation

## 2013-05-26 DIAGNOSIS — R1110 Vomiting, unspecified: Secondary | ICD-10-CM | POA: Insufficient documentation

## 2013-05-26 DIAGNOSIS — IMO0002 Reserved for concepts with insufficient information to code with codable children: Secondary | ICD-10-CM | POA: Insufficient documentation

## 2013-05-26 DIAGNOSIS — R509 Fever, unspecified: Secondary | ICD-10-CM | POA: Insufficient documentation

## 2013-05-26 MED ORDER — METOCLOPRAMIDE HCL 5 MG/ML IJ SOLN
10.0000 mg | Freq: Once | INTRAMUSCULAR | Status: AC
  Administered 2013-05-26: 10 mg via INTRAVENOUS
  Filled 2013-05-26: qty 2

## 2013-05-26 MED ORDER — PROMETHAZINE HCL 25 MG PO TABS: 25.0000 mg | ORAL_TABLET | Freq: Four times a day (QID) | ORAL | Status: DC | PRN

## 2013-05-26 MED ORDER — SODIUM CHLORIDE 0.9 % IV BOLUS (SEPSIS)
1000.0000 mL | Freq: Once | INTRAVENOUS | Status: AC
  Administered 2013-05-26: 1000 mL via INTRAVENOUS

## 2013-05-26 MED ORDER — KETOROLAC TROMETHAMINE 30 MG/ML IJ SOLN
30.0000 mg | Freq: Once | INTRAMUSCULAR | Status: AC
  Administered 2013-05-26: 30 mg via INTRAVENOUS
  Filled 2013-05-26: qty 1

## 2013-05-26 MED ORDER — BUTALBITAL-ASPIRIN-CAFFEINE 50-325-40 MG PO CAPS: 1.0000 | ORAL_CAPSULE | Freq: Two times a day (BID) | ORAL | Status: DC | PRN

## 2013-05-26 MED ORDER — DIPHENHYDRAMINE HCL 50 MG/ML IJ SOLN
25.0000 mg | Freq: Once | INTRAMUSCULAR | Status: AC
  Administered 2013-05-26: 25 mg via INTRAVENOUS
  Filled 2013-05-26: qty 1

## 2013-05-26 NOTE — ED Notes (Signed)
Reports onset Monday night of vomiting, headaches and fever. No acute distress noted at triage.

## 2013-05-26 NOTE — ED Provider Notes (Signed)
TIME SEEN: 8:15 PM  CHIEF COMPLAINT: Headache, subjective fever, vomiting  HPI: Patient is a 32 year old female who presents the emergency department with gradual onset diffuse throbbing headache that started Tuesday morning, 2 days ago. She states she's had similar headaches in the past but it has been several years. She states she's had subjective fever and vomiting. No numbness, tingling or focal weakness. No history of head injury. No sick contacts. Patient does take estradiol and smoke cigarettes occasionally.  ROS: See HPI Constitutional: no fever  Eyes: no drainage  ENT: no runny nose   Cardiovascular:  no chest pain  Resp: no SOB  GI: no vomiting GU: no dysuria Integumentary: no rash  Allergy: no hives  Musculoskeletal: no leg swelling  Neurological: no slurred speech ROS otherwise negative  PAST MEDICAL HISTORY/PAST SURGICAL HISTORY:  History reviewed. No pertinent past medical history.  MEDICATIONS:  Prior to Admission medications   Medication Sig Start Date End Date Taking? Authorizing Provider  amoxicillin (AMOXIL) 500 MG capsule Take 500 mg by mouth 3 (three) times daily.   Yes Historical Provider, MD  cetirizine (ZYRTEC) 10 MG tablet Take 10 mg by mouth daily.   Yes Historical Provider, MD  estradiol (ESTRACE) 2 MG tablet Take 2 mg by mouth 2 (two) times daily.   Yes Historical Provider, MD  ibuprofen (ADVIL,MOTRIN) 600 MG tablet Take 600 mg by mouth every 6 (six) hours as needed for pain.   Yes Historical Provider, MD    ALLERGIES:  Allergies  Allergen Reactions  . Latex Other (See Comments)    Irritates skin    SOCIAL HISTORY:  History  Substance Use Topics  . Smoking status: Former Games developer  . Smokeless tobacco: Not on file  . Alcohol Use: No    FAMILY HISTORY: History reviewed. No pertinent family history.  EXAM: BP 113/65  Pulse 74  Temp(Src) 98 F (36.7 C) (Oral)  Resp 17  SpO2 97% CONSTITUTIONAL: Alert and oriented and responds appropriately  to questions. Well-appearing; well-nourished nontoxic, smiling and laughing HEAD: Normocephalic, patient is tender to palpation at the base of her occiput bilaterally with tight trapezius muscles EYES: Conjunctivae clear, PERRL ENT: normal nose; no rhinorrhea; moist mucous membranes; pharynx without lesions noted NECK: Supple, no meningismus, no LAD  CARD: RRR; S1 and S2 appreciated; no murmurs, no clicks, no rubs, no gallops RESP: Normal chest excursion without splinting or tachypnea; breath sounds clear and equal bilaterally; no wheezes, no rhonchi, no rales,  ABD/GI: Normal bowel sounds; non-distended; soft, non-tender, no rebound, no guarding BACK:  The back appears normal and is non-tender to palpation, there is no CVA tenderness EXT: Normal ROM in all joints; non-tender to palpation; no edema; normal capillary refill; no cyanosis    SKIN: Normal color for age and race; warm NEURO: Moves all extremities equally, strength 5/5 in all 4 extremities, sensation to light touch intact diffusely, cranial nerves II through XII grossly intact, no dysmetria to finger to nose testing PSYCH: The patient's mood and manner are appropriate. Grooming and personal hygiene are appropriate.  MEDICAL DECISION MAKING: Patient with gradual onset headache that started 2 days ago. She is neurologically intact on exam but hemodynamically stable. No meningismus or fever currently and patient did not take any antipyretics today. No suspicion for meningitis. Patient likely has tension headache given her pain with palpation at the base of her occiput. Discussed with patient at length that she does have risk factors for cavernous sinus venous thrombosis but I think this is  unlikely given her headache is benign and she is neurologically intact, will attempt to give medications for pain control and reassess. I do not feel she needs imaging at this time. Patient agrees with this plan.  ED PROGRESS: Patient reports her headache  is much better. She rates it a 2-3/10. It was a 9/10.  Patient reports she feels her headache is at a manageable level and she is ready for discharge home. Given strict return precautions. Had lengthy discussion with patient and advised her to quit smoking given she is taking estradiol for a total hysterectomy and bilateral salpingo-oophorectomy. Patient verbalizes understanding is comfortable with plan.     Layla Maw Fiorella Hanahan, DO 05/26/13 2230

## 2014-02-11 ENCOUNTER — Encounter (HOSPITAL_COMMUNITY): Payer: Self-pay | Admitting: Emergency Medicine

## 2014-02-11 ENCOUNTER — Emergency Department (HOSPITAL_COMMUNITY)
Admission: EM | Admit: 2014-02-11 | Discharge: 2014-02-12 | Disposition: A | Discharge status: Home / Self Care | Payer: No Typology Code available for payment source | Attending: Emergency Medicine | Admitting: Emergency Medicine

## 2014-02-11 DIAGNOSIS — S20212A Contusion of left front wall of thorax, initial encounter: Secondary | ICD-10-CM

## 2014-02-11 DIAGNOSIS — Z79899 Other long term (current) drug therapy: Secondary | ICD-10-CM | POA: Insufficient documentation

## 2014-02-11 DIAGNOSIS — S335XXA Sprain of ligaments of lumbar spine, initial encounter: Secondary | ICD-10-CM | POA: Insufficient documentation

## 2014-02-11 DIAGNOSIS — Z792 Long term (current) use of antibiotics: Secondary | ICD-10-CM | POA: Insufficient documentation

## 2014-02-11 DIAGNOSIS — Y92410 Unspecified street and highway as the place of occurrence of the external cause: Secondary | ICD-10-CM | POA: Insufficient documentation

## 2014-02-11 DIAGNOSIS — Z91040 Latex allergy status: Secondary | ICD-10-CM | POA: Insufficient documentation

## 2014-02-11 DIAGNOSIS — Z87891 Personal history of nicotine dependence: Secondary | ICD-10-CM | POA: Insufficient documentation

## 2014-02-11 DIAGNOSIS — T24239A Burn of second degree of unspecified lower leg, initial encounter: Secondary | ICD-10-CM | POA: Insufficient documentation

## 2014-02-11 DIAGNOSIS — Y9389 Activity, other specified: Secondary | ICD-10-CM | POA: Insufficient documentation

## 2014-02-11 DIAGNOSIS — S39012A Strain of muscle, fascia and tendon of lower back, initial encounter: Secondary | ICD-10-CM

## 2014-02-11 DIAGNOSIS — X19XXXA Contact with other heat and hot substances, initial encounter: Secondary | ICD-10-CM | POA: Insufficient documentation

## 2014-02-11 DIAGNOSIS — S20219A Contusion of unspecified front wall of thorax, initial encounter: Secondary | ICD-10-CM | POA: Insufficient documentation
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
## 2014-02-12 ENCOUNTER — Emergency Department (HOSPITAL_COMMUNITY): Payer: No Typology Code available for payment source

## 2014-02-12 MED ORDER — IBUPROFEN 800 MG PO TABS: 800.0000 mg | ORAL_TABLET | Freq: Three times a day (TID) | ORAL | Status: DC | PRN

## 2014-02-12 MED ORDER — HYDROCODONE-ACETAMINOPHEN 5-325 MG PO TABS: 1.0000 | ORAL_TABLET | Freq: Four times a day (QID) | ORAL | Status: DC | PRN

## 2014-02-12 MED ORDER — SILVER SULFADIAZINE 1 % EX CREA
TOPICAL_CREAM | Freq: Once | CUTANEOUS | Status: AC
  Administered 2014-02-12: 1 via TOPICAL
  Filled 2014-02-12: qty 50
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
## 2014-02-12 NOTE — Discharge Instructions (Signed)
Return here as needed.  Your x-rays did not show any abnormalities.  Ice and heat to the areas that are sore

## 2014-02-13 NOTE — ED Provider Notes (Signed)
Medical screening examination/treatment/procedure(s) were performed by non-physician practitioner and as supervising physician I was immediately available for consultation/collaboration.   EKG Interpretation None       Olivia Mackie, MD 02/13/14 [phone]

## 2014-02-18 ENCOUNTER — Encounter (HOSPITAL_COMMUNITY): Payer: Self-pay | Admitting: Emergency Medicine

## 2014-02-18 ENCOUNTER — Emergency Department (HOSPITAL_COMMUNITY)
Admission: EM | Admit: 2014-02-18 | Discharge: 2014-02-18 | Disposition: A | Discharge status: Home / Self Care | Payer: No Typology Code available for payment source | Attending: Emergency Medicine | Admitting: Emergency Medicine

## 2014-02-18 DIAGNOSIS — Y939 Activity, unspecified: Secondary | ICD-10-CM | POA: Insufficient documentation

## 2014-02-18 DIAGNOSIS — Z91040 Latex allergy status: Secondary | ICD-10-CM | POA: Insufficient documentation

## 2014-02-18 DIAGNOSIS — T148XXA Other injury of unspecified body region, initial encounter: Secondary | ICD-10-CM

## 2014-02-18 DIAGNOSIS — L089 Local infection of the skin and subcutaneous tissue, unspecified: Secondary | ICD-10-CM | POA: Insufficient documentation

## 2014-02-18 DIAGNOSIS — Z87891 Personal history of nicotine dependence: Secondary | ICD-10-CM | POA: Insufficient documentation

## 2014-02-18 DIAGNOSIS — Y92410 Unspecified street and highway as the place of occurrence of the external cause: Secondary | ICD-10-CM | POA: Insufficient documentation

## 2014-02-18 DIAGNOSIS — T24039A Burn of unspecified degree of unspecified lower leg, initial encounter: Secondary | ICD-10-CM | POA: Insufficient documentation

## 2014-02-18 DIAGNOSIS — Z79899 Other long term (current) drug therapy: Secondary | ICD-10-CM | POA: Insufficient documentation

## 2014-02-18 MED ORDER — CEPHALEXIN 500 MG PO CAPS: 500.0000 mg | ORAL_CAPSULE | Freq: Four times a day (QID) | ORAL | Status: DC

## 2014-02-18 MED ORDER — SULFAMETHOXAZOLE-TRIMETHOPRIM 800-160 MG PO TABS: 1.0000 | ORAL_TABLET | Freq: Two times a day (BID) | ORAL | Status: DC

## 2014-02-18 MED ORDER — TRAMADOL HCL 50 MG PO TABS: 50.0000 mg | ORAL_TABLET | Freq: Four times a day (QID) | ORAL | Status: DC | PRN
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
## 2014-02-18 NOTE — ED Provider Notes (Signed)
Medical screening examination/treatment/procedure(s) were performed by non-physician practitioner and as supervising physician I was immediately available for consultation/collaboration.  Toy BakerAnthony T Bridgitt Raggio, MD 02/18/14 [phone]

## 2014-02-18 NOTE — Discharge Instructions (Signed)
Wound Infection °A wound infection happens when a type of germ (bacteria) grows in a wound. Caring for the infection can help the wound heal. Wound infections need treatment. °HOME CARE  °· Only take medicine as told by your doctor. °· Take your antibiotic medicine as told. Finish it even if you start to feel better. °· Clean the wound with mild soap and water as told. Rinse the soap off. Pat the area dry with a clean towel. Do not rub the wound. °· Change any bandages (dressings) as told by your doctor. °· Put cream and a bandage on the wound as told by your doctor. °· If the bandage sticks, wet it with soapy water to remove the bandage. °· Change the bandage if it gets wet, dirty, or starts to smell. °· Take showers. Do not take baths, swim, or do anything that puts your wound under water. °· Avoid exercise that makes you sweat. °· If your wound itches, use a medicine that helps stop itching. Do not pick or scratch at the wound. °· Keep all doctor visits as told. °GET HELP RIGHT AWAY IF:  °· You have more puffiness (swelling), pain, or redness around the wound. °· You have more yellowish-white fluid (pus) coming from the wound. °· You have a bad smell coming from the wound. °· Your wound breaks open more. °· You have a fever. °MAKE SURE YOU:  °· Understand these instructions. °· Will watch your condition. °· Will get help right away if you are not doing well or get worse. °Document Released: 06/03/2008 Document Revised: 11/17/2011 Document Reviewed: 02/03/2011 °[employer]® Patient Information ©2014 [employer]. ° °
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
## 2015-02-01 ENCOUNTER — Emergency Department (HOSPITAL_COMMUNITY)
Admission: EM | Admit: 2015-02-01 | Discharge: 2015-02-01 | Disposition: A | Discharge status: Home / Self Care | Payer: No Typology Code available for payment source | Attending: Emergency Medicine | Admitting: Emergency Medicine

## 2015-02-01 ENCOUNTER — Encounter (HOSPITAL_COMMUNITY): Payer: Self-pay | Admitting: Emergency Medicine

## 2015-02-01 DIAGNOSIS — R0981 Nasal congestion: Secondary | ICD-10-CM

## 2015-02-01 DIAGNOSIS — Z792 Long term (current) use of antibiotics: Secondary | ICD-10-CM | POA: Insufficient documentation

## 2015-02-01 DIAGNOSIS — H9193 Unspecified hearing loss, bilateral: Secondary | ICD-10-CM | POA: Insufficient documentation

## 2015-02-01 DIAGNOSIS — Z87891 Personal history of nicotine dependence: Secondary | ICD-10-CM | POA: Insufficient documentation

## 2015-02-01 DIAGNOSIS — H9203 Otalgia, bilateral: Secondary | ICD-10-CM | POA: Insufficient documentation

## 2015-02-01 DIAGNOSIS — Z79899 Other long term (current) drug therapy: Secondary | ICD-10-CM | POA: Insufficient documentation

## 2015-02-01 DIAGNOSIS — R0989 Other specified symptoms and signs involving the circulatory and respiratory systems: Secondary | ICD-10-CM | POA: Insufficient documentation

## 2015-02-01 NOTE — ED Notes (Signed)
Pt stable, ambulatory, states understanding of discharge instructions 

## 2015-02-01 NOTE — Discharge Instructions (Signed)
Use Sudafed in conjunction with Tylenol to help with her earache and headache. Follow-up with primary care doctor [facility]. Return to the ER if any worsening of symptoms, severe headache, blurred vision, dizziness, weakness, loss of hearing, high fever greater than 100.5, neck pain.  Otalgia The most common reason for this in children is an infection of the middle ear. Pain from the middle ear is usually caused by a build-up of fluid and pressure behind the eardrum. Pain from an earache can be sharp, dull, or burning. The pain may be temporary or constant. The middle ear is connected to the nasal passages by a short narrow tube called the Eustachian tube. The Eustachian tube allows fluid to drain out of the middle ear, and helps keep the pressure in your ear equalized. CAUSES  A cold or allergy can block the Eustachian tube with inflammation and the build-up of secretions. This is especially likely in small children, because their Eustachian tube is shorter and more horizontal. When the Eustachian tube closes, the normal flow of fluid from the middle ear is stopped. Fluid can accumulate and cause stuffiness, pain, hearing loss, and an ear infection if germs start growing in this area. SYMPTOMS  The symptoms of an ear infection may include fever, ear pain, fussiness, increased crying, and irritability. Many children will have temporary and minor hearing loss during and right after an ear infection. Permanent hearing loss is rare, but the risk increases the more infections a child has. Other causes of ear pain include retained water in the outer ear canal from swimming and bathing. Ear pain in adults is less likely to be from an ear infection. Ear pain may be referred from other locations. Referred pain may be from the joint between your jaw and the skull. It may also come from a tooth problem or problems in the neck. Other causes of ear pain include:  A foreign body in the  ear.  Outer ear infection.  Sinus infections.  Impacted ear wax.  Ear injury.  Arthritis of the jaw or TMJ problems.  Middle ear infection.  Tooth infections.  Sore throat with pain to the ears. DIAGNOSIS  Your caregiver can usually make the diagnosis by examining you. Sometimes other special studies, including x-rays and lab work may be necessary. TREATMENT   If antibiotics were prescribed, use them as directed and finish them even if you or your child's symptoms seem to be improved.  Sometimes PE tubes are needed in children. These are little plastic tubes which are put into the eardrum during a simple surgical procedure. They allow fluid to drain easier and allow the pressure in the middle ear to equalize. This helps relieve the ear pain caused by pressure changes. HOME CARE INSTRUCTIONS   Only take over-the-counter or prescription medicines for pain, discomfort, or fever as directed by your caregiver. DO NOT GIVE CHILDREN ASPIRIN because of the association of Reye's Syndrome in children taking aspirin.  Use a cold pack applied to the outer ear for 15-20 minutes, 03-04 times per day or as needed may reduce pain. Do not apply ice directly to the skin. You may cause frost bite.  Over-the-counter ear drops used as directed may be effective. Your caregiver may sometimes prescribe ear drops.  Resting in an upright position may help reduce pressure in the middle ear and relieve pain.  Ear pain caused by rapidly descending from high altitudes can be relieved by swallowing or chewing gum. Allowing  infants to suck on a bottle during airplane travel can help.  Do not smoke in the house or near children. If you are unable to quit smoking, smoke outside.  Control allergies. SEEK IMMEDIATE MEDICAL CARE IF:   You or your child are becoming sicker.  Pain or fever relief is not obtained with medicine.  You or your child's symptoms (pain, fever, or irritability) do not improve within  24 to 48 hours or as instructed.  Severe pain suddenly stops hurting. This may indicate a ruptured eardrum.  You or your children develop new problems such as severe headaches, stiff neck, difficulty swallowing, or swelling of the face or around the ear. Document Released: 04/11/2004 Document Revised: 11/17/2011 Document Reviewed: 08/16/2008 [facility] Patient Information 2015 [place], [place]. This information is not intended to replace advice given to you by your health care provider. Make sure you discuss any questions you have with your health care provider.  Sinusitis Sinusitis is redness, soreness, and inflammation of the paranasal sinuses. Paranasal sinuses are air pockets within the bones of your face (beneath the eyes, the middle of the forehead, or above the eyes). In healthy paranasal sinuses, mucus is able to drain out, and air is able to circulate through them by way of your nose. However, when your paranasal sinuses are inflamed, mucus and air can become trapped. This can allow bacteria and other germs to grow and cause infection. Sinusitis can develop quickly and last only a short time (acute) or continue over a long period (chronic). Sinusitis that lasts for more than 12 weeks is considered chronic.  CAUSES  Causes of sinusitis include:  Allergies.  Structural abnormalities, such as displacement of the cartilage that separates your nostrils (deviated septum), which can decrease the air flow through your nose and sinuses and affect sinus drainage.  Functional abnormalities, such as when the small hairs (cilia) that line your sinuses and help remove mucus do not work properly or are not present. SIGNS AND SYMPTOMS  Symptoms of acute and chronic sinusitis are the same. The primary symptoms are pain and pressure around the affected sinuses. Other symptoms include:  Upper toothache.  Earache.  Headache.  Bad breath.  Decreased sense of smell and taste.  A cough, which worsens  when you are lying flat.  Fatigue.  Fever.  Thick drainage from your nose, which often is green and may contain pus (purulent).  Swelling and warmth over the affected sinuses. DIAGNOSIS  Your health care provider will perform a physical exam. During the exam, your health care provider may:  Look in your nose for signs of abnormal growths in your nostrils (nasal polyps).  Tap over the affected sinus to check for signs of infection.  View the inside of your sinuses (endoscopy) using an imaging device that has a light attached (endoscope). If your health care provider suspects that you have chronic sinusitis, one or more of the following tests may be recommended:  Allergy tests.  Nasal culture. A sample of mucus is taken from your nose, sent to a lab, and screened for bacteria.  Nasal cytology. A sample of mucus is taken from your nose and examined by your health care provider to determine if your sinusitis is related to an allergy. TREATMENT  Most cases of acute sinusitis are related to a viral infection and will resolve on their own within 10 days. Sometimes medicines are prescribed to help relieve symptoms (pain medicine, decongestants, nasal steroid sprays, or saline sprays).  However, for sinusitis related  to a bacterial infection, your health care provider will prescribe antibiotic medicines. These are medicines that will help kill the bacteria causing the infection.  Rarely, sinusitis is caused by a fungal infection. In theses cases, your health care provider will prescribe antifungal medicine. For some cases of chronic sinusitis, surgery is needed. Generally, these are cases in which sinusitis recurs more than 3 times per year, despite other treatments. HOME CARE INSTRUCTIONS   Drink plenty of water. Water helps thin the mucus so your sinuses can drain more easily.  Use a humidifier.  Inhale steam 3 to 4 times a day (for example, sit in the bathroom with the shower  running).  Apply a warm, moist washcloth to your face 3 to 4 times a day, or as directed by your health care provider.  Use saline nasal sprays to help moisten and clean your sinuses.  Take medicines only as directed by your health care provider.  If you were prescribed either an antibiotic or antifungal medicine, finish it all even if you start to feel better. SEEK IMMEDIATE MEDICAL CARE IF:  You have increasing pain or severe headaches.  You have nausea, vomiting, or drowsiness.  You have swelling around your face.  You have vision problems.  You have a stiff neck.  You have difficulty breathing. MAKE SURE YOU:   Understand these instructions.  Will watch your condition.  Will get help right away if you are not doing well or get worse. Document Released: 08/25/2005 Document Revised: 01/09/2014 Document Reviewed: 09/09/2011 [facility] Patient Information 2015 [place], [place]. This information is not intended to replace advice given to you by your health care provider. Make sure you discuss any questions you have with your health care provider.  Emergency Department Resource Guide 1) Find a Doctor and Pay Out of Pocket Although you won't have to find out who is covered by your insurance plan, it is a good idea to ask around and get recommendations. You will then need to call the office and see if the doctor you have chosen will accept you as a new patient and what types of options they offer for patients who are self-pay. Some doctors offer discounts or will set up payment plans for their patients who do not have insurance, but you will need to ask so you aren't surprised when you get to your appointment.  2) Contact Your Local Health Department Not all health departments have doctors that can see patients for sick visits, but many do, so it is worth a call to see if yours does. If you don't know where your local health department is, you can check in your phone book. The CDC also  has a tool to help you locate your state's health department, and many state websites also have listings of all of their local health departments.  3) Find a Walk-in Clinic If your illness is not likely to be very severe or complicated, you may want to try a walk in clinic. These are popping up all over the country in pharmacies, drugstores, and shopping centers. They're usually staffed by nurse practitioners or physician assistants that have been trained to treat common illnesses and complaints. They're usually fairly quick and inexpensive. However, if you have serious medical issues or chronic medical problems, these are probably not your best option.  No Primary Care Doctor: - Call Health Connect at  [phone] - they can help you locate a primary care doctor that  accepts your insurance, provides certain services, etc. -  Physician Referral Service- [phone]  Chronic Pain Problems: Organization         Address  Phone   Notes  [facility]  [phone] Patients need to be referred by their primary care doctor.   Medication Assistance: Organization         Address  Phone   Notes  [facility] [place] [facility] [address] [place], [place] [address] [phone] --Must be a resident of [facility] -- Must have NO insurance coverage whatsoever (no Medicaid/ Medicare, etc.) -- The pt. MUST have a primary care doctor that directs their care regularly and follows them in the community   [facility]  [phone]   [employer]  [phone]    Agencies that provide inexpensive medical care: Organization         Address  Phone   Notes  [facility]  [phone]   [facility]    [phone]   [facility] [address] [place], [place] [address] [phone]   [facility] [address], [place] [phone]   Planned Parenthood    [phone]    [facility]    [phone]   [facility] and [facility]  [address], [place] Phone:  [phone], Fax:  [phone] Hours of Operation:  9 am - 6 pm, M-F.  Also accepts Medicaid/Medicare and self-pay.  [facility]  [address], [place] Phone: [phone], Fax: [phone]. Hours of Operation:  8:30 am - 5:30 pm, M-F.  Also accepts Medicaid and self-pay.  [facility] [place] [address] [place] Phone: [phone]   [facility] [address] Natasha Bence [place], [place] [phone], Ext. [phone] Mondays & Thursdays: 7-9 AM.  First 15 patients are seen on a first come, first serve basis.    Medicaid-accepting [facility] Providers:  Organization         Address  Phone   Notes  [facility] [address], [place] [phone] Also accepts self-pay patients.  [facility] [address] Laurell Josephs [place], [place]  [phone]   [facility] [address], [place] [phone]   [facility] [address] [place] [phone]   Renaye Rakers [address], [place]   [phone] Only accepts [place] Access [place] patients after they have their name applied to their card.   Self-Pay (no insurance) in [facility]:  Organization         Address  Phone   Notes  Sickle Cell Patients, [facility] [place] [facility] [address] [place], [place] [phone]   [facility] [address] [place], [place] [phone]   [facility] [place]  [address], [place] [phone]   [facility]/Dr. Osei-Bonsu  [address] [place] or [address], [place] [phone] Phone number for both [place] and [place] locations is the same.  Urgent Medical and [facility] [address], [place] [phone]   [facility] [address] [place] or [address] [phone]   [facility] [address] [place], [place] [phone],  phone; [phone], fax Sees patients 1st and 3rd Saturday of every month.  Must not qualify for public or private insurance (i.e. Medicaid, Medicare, [place] Health Choice, Veterans' Benefits)  Household income should be no more than 200% of the poverty level The clinic cannot treat you if you are pregnant or think you are pregnant  Sexually transmitted diseases are not treated at the clinic.    Dental Care: Organization         Address  Phone  Notes  [facility] [address] [place], [place] [phone] Accepts children up to age 4 who are enrolled in [place] or [place] Health Choice; pregnant women with a Medicaid card; and children who have applied for Medicaid or [place] Health Choice, but were declined, whose parents can pay a reduced fee at time of service.  [facility]  [address], [place] [phone] Accepts children up to age 10 who are enrolled in [place] or [place] Health Choice; pregnant women with a Medicaid card; and children who have applied for Medicaid or [place] Health Choice, but were declined, whose parents can pay a reduced fee at time of service.  [facility]  [address] [place], [place] [phone] Patients are seen by appointment only. Walk-ins are not accepted. [facility] will see patients 62 years of age and older. Monday - Tuesday (8am-5pm) Most Wednesdays (8:30-5pm) $30 per visit, cash only  [facility] Adult [facility]  [address], [facility] [phone] Patients are seen by appointment only. Walk-ins are not accepted. [facility] will see patients 20 years of age and older. One Wednesday  Evening (Monthly: Volunteer Based).  $30 per visit, cash only  [employer]  [phone] for adults; Children under age 20, call Graduate Pediatric Dentistry at [phone]. Children aged 1-14, please call [phone] to request a pediatric application.  Dental services are provided in all areas of dental care including fillings, crowns and bridges, complete and partial dentures, implants, gum treatment, root canals, and extractions. Preventive care is also provided. Treatment is provided to both adults and children. Patients are selected via a lottery and there is often a waiting list.   [facility] [address], [place]  [phone] [email or web address]   [facility] [address] [place], [place] [phone], Ext. [phone] Second and Fourth Thursday of each month, opens at 6:30 AM; Clinic ends at 9 AM.  Patients are seen on a first-come first-served basis, and a limited number are seen during each clinic.   [facility]  [address] Ether Griffins [place], [place] [phone]   Eligibility Requirements You must have lived in [place], [place], or [place] counties for at least the last three months.   You cannot be eligible for state or federal sponsored [place], including [employer], [place], or [employer].   You generally cannot be eligible for healthcare insurance through your employer.    How to apply: Eligibility screenings are held every Tuesday and Wednesday afternoon from 1:00 pm until 4:00 pm. You do not need an appointment for the interview!  [facility] [address] [place], [place] [phone]   [facility] Health Department  [phone]   [facility] Health Department  [phone]   [facility] Health Department  [phone]    Behavioral Health Resources in the  Community: Intensive Outpatient Production manager         Address  Phone  Notes  [facility] [address], [place], [place] [phone]   [facility] [address] [place], [place] [phone]   [facility] [address], [place], [place]  [phone]   [facility] [address],  [place], [place] [phone] or [phone]   Substance Abuse Resources Organization         Address  Phone  Notes  [facility]  [phone]   [facility]  [phone]   [facility] [place]  [phone]   Floydene Flock  [phone]   Residential & Outpatient Substance Abuse Program  [phone]   Psychological Services Organization         Address  Phone  Notes  [facility]  [phone]   [facility] Services  [phone]   [facility] Health [address], [place] [phone] or [phone]    Mobile Crisis Teams Organization         Address  Phone  Notes  [facility], Mobile Crisis Care Unit  [phone]   [facility]  [address] [place], [place] [phone]   Doristine Locks [address] [place] [phone]    Self-Help/Support Groups Organization         Address  Phone             Notes  [facility] [place] - variety of support groups  336- [phone] Call for more information  Narcotics Anonymous (NA), [facility] [address], [employer] [place]  2 meetings at this location   Statistician         Address  Phone  Notes  [facility] [address],    [place]  [phone]   [facility]  [address], [place] [address], [place], [place] [phone]   [facility] [address] [employer], [place] [phone] Admissions: 8am-3pm M-F  [facility] [address],    [place], [place] [phone]   [facility] [address] [place],  [place], [place] [phone]   [facility] [address]  [place], [place] [phone]   [facility] - Intensive Outpatient [address] Laurell Josephs [address], [place], [place] [phone]   [facility] ([facility] [address]  [place], [place] [phone] or [phone]   [facility] ([facility]) [address] [place], [place] [phone] Accepts Medicaid  [facility] [place] [address],  [place] [phone] Substance Abuse/Addiction Treatment   [facility] Organization         Address  Phone  Notes  [facility]  [phone]   Angie Fava, PhD [address] Ervin Knack [place], [place]   [phone] or [phone]   [facility]   [address] [place], [place] [phone]   [facility] [address] [place], [place] [phone] Insurance/Medicaid/sponsorship through [employer] and Families [address]                                    [place], [place] [phone] Therapy/tele-psych/case  [facility] [address], [place] [phone]  Dr. Adele Schilder  [phone]   [facility] Dept. 1) [address], [place] 2) [place] 3)  [place] [address], [place] [phone]  [phone]   [place] [phone] or [phone] (After Hours)

## 2015-02-01 NOTE — ED Notes (Signed)
Woke up today with "ears being plugged up--headache started this afternoon-- " pt stated placed sweet oil in ears and made it worse. "feels like head is in a barrel"

## 2015-02-01 NOTE — ED Provider Notes (Signed)
CSN: [id number]     Arrival date & time 02/01/15  1650 History   This chart was scribed for non-physician practitioner, Jinny Sanders, PA-C working with Arby Barrette, MD, by Abel Presto, ED Scribe. This patient was seen in room TR08C/TR08C and the patient's care was started at 7:05 PM.       Chief Complaint  Patient presents with  . Headache  . Cerumen Impaction      The history is provided by the patient. No language interpreter was used.   HPI Comments: Jordan Ibarra is a 34 y.o. female who presents to the Emergency Department complaining of otalgia which began this morning along with associated mild, gradual headache beginning this afternoon. Pt reports bilateral ear congestion with onset this morning and associated hearing loss which she describes as a "fullness". She states she felt as though her ears needed to pop but was unable to create this sensation. 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 Patient states she has had multiple headaches in the past, feels like her typical tension type headaches, however states it is not as severe. She states despite her headache not being severe it just feels irritable. Pt denies h/o seasonal allergies. Pt denies ear pain, nasal congestion, cough, sore throat, fever, blurred vision, dizziness, weakness, neck pain, chest pain, shortness of breath, abdominal pain, nausea, vomiting.  History reviewed. No pertinent past medical history. Past Surgical History  Procedure Laterality Date  . Appendectomy    . Abdominal hysterectomy    . Tonsillectomy     No family history on file. History  Substance Use Topics  . Smoking status: Former Games developer  . Smokeless tobacco: Not on file  . Alcohol Use: No   OB History    No data available     Review of Systems  HENT: Positive for congestion (ear "pressure") and hearing loss.  Negative for ear pain and sore throat.   Respiratory: Negative for cough.   Neurological: Positive for headaches.      Allergies  Latex  Home Medications   Prior to Admission medications   Medication Sig Start Date End Date Taking? Authorizing Provider  cephALEXin (KEFLEX) 500 MG capsule Take 1 capsule (500 mg total) by mouth 4 (four) times daily. 02/18/14   Teressa Lower, NP  estradiol (ESTRACE) 2 MG tablet Take 4 mg by mouth daily.    Historical Provider, MD  HYDROcodone-acetaminophen (NORCO/VICODIN) 5-325 MG per tablet Take 1 tablet by mouth every 6 (six) hours as needed for moderate pain. 02/12/14   Charlestine Night, PA-C  ibuprofen (ADVIL,MOTRIN) 800 MG tablet Take 1 tablet (800 mg total) by mouth every 8 (eight) hours as needed. 02/12/14   Charlestine Night, PA-C  silver sulfADIAZINE (SILVADENE) 1 % cream Apply 1 application topically daily.    Historical Provider, MD  sulfamethoxazole-trimethoprim (SEPTRA DS) 800-160 MG per tablet Take 1 tablet by mouth 2 (two) times daily. 02/18/14   Teressa Lower, NP  traMADol (ULTRAM) 50 MG tablet Take 1 tablet (50 mg total) by mouth every 6 (six) hours as needed. 02/18/14   Teressa Lower, NP   BP 103/66 mmHg  Pulse 70  Temp(Src) 97.6 F (36.4 C) (Oral)  Resp 16  SpO2 99% Physical Exam  Constitutional: She is oriented to person, place, and time. She appears well-developed and well-nourished.  HENT:  Head: Normocephalic and atraumatic.  Right Ear: Tympanic membrane and ear canal normal. No mastoid tenderness. Tympanic membrane is not injected. No hemotympanum.  Left Ear: Tympanic membrane and ear canal normal. No mastoid tenderness. Tympanic membrane is not injected. No hemotympanum.  Nose: Nose normal.  Mouth/Throat: Uvula is midline and oropharynx is clear and moist. No trismus in the jaw. No uvula swelling. No oropharyngeal exudate, posterior oropharyngeal edema, posterior oropharyngeal erythema or tonsillar abscesses.  Eyes:  Conjunctivae and EOM are normal. Pupils are equal, round, and reactive to light.  Neck: Normal range of motion and full passive range of motion without pain. Neck supple. No spinous process tenderness and no muscular tenderness present. No rigidity. No edema, no erythema and normal range of motion present. No Brudzinski's sign and no Kernig's sign noted.  Pulmonary/Chest: Effort normal.  Musculoskeletal: Normal range of motion.  Neurological: She is alert and oriented to person, place, and time. She has normal strength. No cranial nerve deficit or sensory deficit. She displays a negative Romberg sign. Coordination and gait normal. GCS eye subscore is 4. GCS verbal subscore is 5. GCS motor subscore is 6.  Patient fully alert, answering questions appropriately in full, clear sentences. Cranial nerves II through XII grossly intact. Motor strength 5 out of 5 in all major muscle groups of upper and lower extremities. Distal sensation intact.   Skin: Skin is warm and dry.  Psychiatric: She has a normal mood and affect. Her behavior is normal.  Nursing note and vitals reviewed.   ED Course  Procedures (including critical care time) DIAGNOSTIC STUDIES: Oxygen Saturation is 96% on room air, normal by my interpretation.    COORDINATION OF CARE: 8:19 PM Discussed treatment plan with patient at beside, the patient agrees with the plan and has no further questions at this time.   Labs Review Labs Reviewed - No data to display  Imaging Review No results found.   EKG Interpretation None      MDM   Final diagnoses:  Sinus congestion  Otalgia, bilateral   Patient with signs and symptoms consistent with bilateral otalgia and mild headache. Patient reported having headaches in the past, does not appear to be in any distress from this one. Patient signed symptoms seem to be consistent with a possible sinusitis contributing to bilateral otalgia. There is no concern for otitis media or externa. No  concern for mastoiditis. Or malignant otitis. Patient afebrile, hemodynamically stable and in no acute distress. No concern for meningitis. Neuro exam benign. No concern for CVA or cavernous sinus thrombosis based on exam. Patient stable for discharge, will encouraged patient to use decongestions over-the-counter, and follow-up with PCP. Return precautions discussed with patient, she verbalizes understanding and agreement of this plan.  I personally performed the services described in this documentation, which was scribed in my presence. The recorded information has been reviewed and is accurate.  BP 103/66 mmHg  Pulse 70  Temp(Src) 97.6 F (36.4 C) (Oral)  Resp 16  SpO2 99%  Signed,  Ladona MowJoe Derreon Consalvo, PA-C 8:19 PM  Patient discussed with Dr. Arby BarretteMarcy Pfeiffer, MD  Ladona MowJoe Meridith Romick, PA-C 02/01/15 2019  Arby BarretteMarcy Pfeiffer, MD 02/02/15 0130

## 2015-04-01 ENCOUNTER — Emergency Department (HOSPITAL_COMMUNITY): Payer: No Typology Code available for payment source

## 2015-04-01 ENCOUNTER — Encounter (HOSPITAL_COMMUNITY): Payer: Self-pay | Admitting: *Deleted

## 2015-04-01 ENCOUNTER — Emergency Department (HOSPITAL_COMMUNITY)
Admission: EM | Admit: 2015-04-01 | Discharge: 2015-04-02 | Disposition: A | Discharge status: Home / Self Care | Payer: No Typology Code available for payment source | Attending: Emergency Medicine | Admitting: Emergency Medicine

## 2015-04-01 DIAGNOSIS — Z79899 Other long term (current) drug therapy: Secondary | ICD-10-CM | POA: Insufficient documentation

## 2015-04-01 DIAGNOSIS — Z87891 Personal history of nicotine dependence: Secondary | ICD-10-CM | POA: Insufficient documentation

## 2015-04-01 DIAGNOSIS — M940 Chondrocostal junction syndrome [Tietze]: Secondary | ICD-10-CM

## 2015-04-01 DIAGNOSIS — R0602 Shortness of breath: Secondary | ICD-10-CM | POA: Insufficient documentation

## 2015-04-01 DIAGNOSIS — Z91040 Latex allergy status: Secondary | ICD-10-CM | POA: Insufficient documentation

## 2015-04-01 LAB — CBC
HCT: 43.6 % (ref 36.0–46.0)
HEMOGLOBIN: 14.7 g/dL (ref 12.0–15.0)
MCH: 28.9 pg (ref 26.0–34.0)
MCHC: 33.7 g/dL (ref 30.0–36.0)
MCV: 85.7 fL (ref 78.0–100.0)
Platelets: 280 10*3/uL (ref 150–400)
RBC: 5.09 MIL/uL (ref 3.87–5.11)
RDW: 12.7 % (ref 11.5–15.5)
WBC: 10 10*3/uL (ref 4.0–10.5)

## 2015-04-01 LAB — BASIC METABOLIC PANEL
Anion gap: 10 (ref 5–15)
BUN: 16 mg/dL (ref 6–20)
CALCIUM: 9.1 mg/dL (ref 8.9–10.3)
CO2: 26 mmol/L (ref 22–32)
Chloride: 99 mmol/L — ABNORMAL LOW (ref 101–111)
Creatinine, Ser: 1.14 mg/dL — ABNORMAL HIGH (ref 0.44–1.00)
GLUCOSE: 103 mg/dL — AB (ref 65–99)
Potassium: 3.9 mmol/L (ref 3.5–5.1)
SODIUM: 135 mmol/L (ref 135–145)

## 2015-04-01 LAB — D-DIMER, QUANTITATIVE (NOT AT ARMC): D DIMER QUANT: 0.38 ug{FEU}/mL (ref 0.00–0.48)

## 2015-04-01 LAB — TROPONIN I

## 2015-04-01 NOTE — ED Notes (Signed)
The pt is c/o pain in her lt upper chest since this am with some sob  lmp none.  No previous history

## 2015-04-02 MED ORDER — NAPROXEN 500 MG PO TABS: 500.0000 mg | ORAL_TABLET | Freq: Two times a day (BID) | ORAL | Status: DC

## 2015-04-02 NOTE — ED Provider Notes (Signed)
CSN: [id number]     Arrival date & time 04/01/15  2043 History   First MD Initiated Contact with Patient 04/01/15 2203     Chief Complaint  Patient presents with  . Chest Pain     (Consider location/radiation/quality/duration/timing/severity/associated sxs/prior Treatment) HPI Sudden onset of chest pain left upper chest. Onset at rest. Patient felt some shortness of breath in association. No associated syncope. No recent cough or fever. No leg swelling or pain. History reviewed. No pertinent past medical history. Past Surgical History  Procedure Laterality Date  . Appendectomy    . Abdominal hysterectomy    . Tonsillectomy     No family history on file. History  Substance Use Topics  . Smoking status: Former Games developer  . Smokeless tobacco: Not on file  . Alcohol Use: No   OB History    No data available     Review of Systems  10 Systems reviewed and are negative for acute change except as noted in the HPI.   Allergies  Latex  Home Medications   Prior to Admission medications   Medication Sig Start Date End Date Taking? Authorizing Provider  estradiol (ESTRACE) 2 MG tablet Take 2 mg by mouth 2 (two) times daily.    Yes Historical Provider, MD  ibuprofen (ADVIL,MOTRIN) 200 MG tablet Take 400 mg by mouth daily as needed (pain).   Yes Historical Provider, MD  OVER THE COUNTER MEDICATION Take 1 tablet by mouth daily with lunch. Fat fighter tablet by [employer]   Yes Ecologist, MD  OVER THE COUNTER MEDICATION Take 1 tablet by mouth daily. [employer]   Yes Ecologist, MD  HYDROcodone-acetaminophen (NORCO/VICODIN) 5-325 MG per tablet Take 1 tablet by mouth every 6 (six) hours as needed for moderate pain. Patient not taking: Reported on 04/01/2015 02/12/14   Charlestine Night, PA-C  ibuprofen (ADVIL,MOTRIN) 800 MG tablet Take 1 tablet (800 mg total) by mouth every 8 (eight) hours as needed. Patient not taking: Reported on 04/01/2015 02/12/14   Charlestine Night, PA-C  naproxen (NAPROSYN) 500 MG tablet Take 1 tablet (500 mg total) by mouth 2 (two) times daily. 04/02/15   Arby Barrette, MD  traMADol (ULTRAM) 50 MG tablet Take 1 tablet (50 mg total) by mouth every 6 (six) hours as needed. Patient not taking: Reported on 04/01/2015 02/18/14   Teressa Lower, NP   BP 104/64 mmHg  Pulse 66  Temp(Src) 97.7 F (36.5 C)  Resp 17  Ht  (1.651 m)  Wt 181 lb (82.101 kg)  BMI 30.12 kg/m2  SpO2 96% Physical Exam  Constitutional: She is oriented to person, place, and time. She appears well-developed and well-nourished.  HENT:  Head: Normocephalic and atraumatic.  Eyes: EOM are normal. Pupils are equal, round, and reactive to light.  Neck: Neck supple.  Cardiovascular: Normal rate, regular rhythm, normal heart sounds and intact distal pulses.   Pulmonary/Chest: Effort normal and breath sounds normal. She exhibits tenderness.  Reproducible chest wall pain at the left costosternal margin. Patient reports this reproduces the pain she was expressing.  Abdominal: Soft. Bowel sounds are normal. She exhibits no distension. There is no tenderness.  Musculoskeletal: Normal range of motion. She exhibits no edema or tenderness.  Neurological: She is alert and oriented to person, place, and time. She has normal strength. Coordination normal. GCS eye subscore is [id number] subscore is 5. GCS motor subscore is 6.  Skin: Skin is warm, dry and intact.  Psychiatric:  She has a normal mood and affect.    ED Course  Procedures (including critical care time) Labs Review Labs Reviewed  BASIC METABOLIC PANEL - Abnormal; Notable for the following:    Chloride 99 (*)    Glucose, Bld 103 (*)    Creatinine, Ser 1.14 (*)    All other components within normal limits  CBC  TROPONIN I  D-DIMER, QUANTITATIVE (NOT AT [facility])    Imaging Review No results found.   EKG Interpretation   Date/Time:  Sunday April 01 2015 20:48:25 EDT Ventricular Rate:  87 PR  Interval:  170 QRS Duration: 100 QT Interval:  390 QTC Calculation: 469 R Axis:   102 Text Interpretation:  Normal sinus rhythm Incomplete right bundle branch  block Possible Right ventricular hypertrophy Nonspecific T wave  abnormality Prolonged QT Abnormal ECG agree. no change from prior  Confirmed by TEST, Record ([id number]) on 04/02/2015 6:55:38 AM      MDM   Final diagnoses:  Costochondritis, acute   Acute onset of chest pain. No lower extremity pain or swelling no current PE risk factors. D-dimer not elevated. EKG unchanged from prior, history of present illness not suggestive of ischemic type disease. Patient has reproducible chest wall pain. At this time findings most consistent with costochondritis. Return precautions and instructions are given with follow-up plan.    Arby Barrette, MD 04/05/15 [phone]

## 2015-04-02 NOTE — Discharge Instructions (Signed)
Costochondritis °Costochondritis, sometimes called Tietze syndrome, is a swelling and irritation (inflammation) of the tissue (cartilage) that connects your ribs with your breastbone (sternum). It causes pain in the chest and rib area. Costochondritis usually goes away on its own over time. It can take up to 6 weeks or longer to get better, especially if you are unable to limit your activities. °CAUSES  °Some cases of costochondritis have no known cause. Possible causes include: °· Injury (trauma). °· Exercise or activity such as lifting. °· Severe coughing. °SIGNS AND SYMPTOMS °· Pain and tenderness in the chest and rib area. °· Pain that gets worse when coughing or taking deep breaths. °· Pain that gets worse with specific movements. °DIAGNOSIS  °Your health care provider will do a physical exam and ask about your symptoms. Chest X-rays or other tests may be done to rule out other problems. °TREATMENT  °Costochondritis usually goes away on its own over time. Your health care provider may prescribe medicine to help relieve pain. °HOME CARE INSTRUCTIONS  °· Avoid exhausting physical activity. Try not to strain your ribs during normal activity. This would include any activities using chest, abdominal, and side muscles, especially if heavy weights are used. °· Apply ice to the affected area for the first 2 days after the pain begins. °¨ Put ice in a plastic bag. °¨ Place a towel between your skin and the bag. °¨ Leave the ice on for 20 minutes, 2-3 times a day. °· Only take over-the-counter or prescription medicines as directed by your health care provider. °SEEK MEDICAL CARE IF: °· You have redness or swelling at the rib joints. These are signs of infection. °· Your pain does not go away despite rest or medicine. °SEEK IMMEDIATE MEDICAL CARE IF:  °· Your pain increases or you are very uncomfortable. °· You have shortness of breath or difficulty breathing. °· You cough up blood. °· You have worse chest pains,  sweating, or vomiting. °· You have a fever or persistent symptoms for more than 2-3 days. °· You have a fever and your symptoms suddenly get worse. °MAKE SURE YOU:  °· Understand these instructions. °· Will watch your condition. °· Will get help right away if you are not doing well or get worse. °Document Released: 06/04/2005 Document Revised: 06/15/2013 Document Reviewed: 03/29/2013 °ExitCare® Patient Information ©2015 [employer]. This information is not intended to replace advice given to you by your health care provider. Make sure you discuss any questions you have with your health care provider. ° ° °Emergency Department Resource Guide °1) Find a Doctor and Pay Out of Pocket °Although you won't have to find out who is covered by your insurance plan, it is a good idea to ask around and get recommendations. You will then need to call the office and see if the doctor you have chosen will accept you as a new patient and what types of options they offer for patients who are self-pay. Some doctors offer discounts or will set up payment plans for their patients who do not have insurance, but you will need to ask so you aren't surprised when you get to your appointment. ° °2) Contact Your Local Health Department °Not all health departments have doctors that can see patients for sick visits, but many do, so it is worth a call to see if yours does. If you don't know where your local health department is, you can check in your phone book. The CDC also has a tool to help you   locate your state's health department, and many state websites also have listings of all of their local health departments. ° °3) Find a Walk-in Clinic °If your illness is not likely to be very severe or complicated, you may want to try a walk in clinic. These are popping up all over the country in pharmacies, drugstores, and shopping centers. They're usually staffed by nurse practitioners or physician assistants that have been trained to treat  common illnesses and complaints. They're usually fairly quick and inexpensive. However, if you have serious medical issues or chronic medical problems, these are probably not your best option. ° °No Primary Care Doctor: °- Call [facility] at  832-8000 - they can help you locate a primary care doctor that  accepts your insurance, provides certain services, etc. °- Physician Referral Service- 1-800-533-3463 ° °Chronic Pain Problems: °Organization         Address  Phone   Notes  °[facility]  (336) 297-2271 Patients need to be referred by their primary care doctor.  ° °Medication Assistance: °Organization         Address  Phone   Notes  °[place] [facility] [address] °[place], [place] [address] (336) 641-8030 --Must be a resident of [place] °-- Must have NO insurance coverage whatsoever (no Medicaid/ Medicare, etc.) °-- The pt. MUST have a primary care doctor that directs their care regularly and follows them in the community °  °[facility]  (866) 331-1348   °[employer]  (888) 892-1162   ° °Agencies that provide inexpensive medical care: °Organization         Address  Phone   Notes  °[facility]  (336) 832-8035   ° Internal Medicine    (336) 832-7272   °[facility] [address] °[place], [place] [address] (336) 832-4777   °[facility] [address], °[place] (336) 271-4999   °[facility]    (336) 373-0678   °[facility]    (336) 272-1050   °[facility] ° [address], [place] Phone:  (336) 832-4444, Fax:  (336) 832-4440 Hours of Operation:  9 am - 6 pm, M-F.  Also accepts Medicaid/Medicare and self-pay.  °[facility] ° [address], [place] Phone: (336) 832-3150, Fax: (336) 832-3151. Hours of Operation:  8:30 am - 5:30 pm, M-F.  Also accepts Medicaid and self-pay.  °[facility] [place] [address], [place] Phone: (336) 878-6027   °[facility] [address], [place], [place] (336)723-1848, Ext. 123 Mondays & Thursdays: 7-9 AM.  First 15 patients are seen on a first come, first serve basis. °  ° °Medicaid-accepting [place] Providers: ° °Organization         Address  Phone   Notes  °[facility] [address], [place] (336) 641-2100 Also accepts self-pay patients.  °[facility] [address], [place] ° (336) 856-9996   °[facility] [address], [place] (336) 288-8857   °[facility] [address] [place] [address], [place] (336) 299-7000   °Veita Bland [address], [place]  ° (336) 373-1557 Only accepts [place] Access Medicaid patients after they have their name applied to their card.  ° °Self-Pay (no insurance) in [place]: ° °Organization           Address  Phone   Notes  °Sickle Cell Patients, [facility] [address], [place] (336) 832-1970   °[facility] [address], [place] (336) 832-4400   °[facility] [place] ° [address], [place] (336) 992-4800   °[facility]/Dr. Osei-Bonsu ° [address] [place] [address], [place] or [address], [place] (336) 841-8500 Phone number for both [place] and [place] locations is the same.  °[facility] [address], [place] (336) 299-0000   °[facility] [address] [place] [address], [place] or [address] (336) 852-7530 °(336) 878-2260   °[facility] [address], [place] (336) 350-1642, phone; (336) 294-5005, fax Sees patients 1st and 3rd Saturday of every month.  Must not qualify for public or private insurance (i.e. Medicaid, Medicare, [place] Health Choice, Veterans' Benefits) • Household income should be no more than 200% of the  poverty level •The clinic cannot treat you if you are pregnant or think you are pregnant • Sexually transmitted diseases are not treated at the clinic.  ° ° °Dental Care: °Organization         Address  Phone  Notes  °[facility] [address], [place] (336) 641-6152 Accepts children up to age 21 who are enrolled in Medicaid or [place] Health Choice; pregnant women with a Medicaid card; and children who have applied for Medicaid or [place] Health Choice, but were declined, whose parents can pay a reduced fee at time of service.  °[facility] [place]  [address], [place] (336) 641-7733 Accepts children up to age 21 who are enrolled in Medicaid or [place] Health Choice; pregnant women with a Medicaid card; and children who have applied for Medicaid or [place] Health Choice, but were declined, whose parents can pay a reduced fee at time of service.  °[facility] ° [address], [place] (336) 641-4533 Patients are seen by appointment only. Walk-ins are not accepted. [facility] will see patients 18 years of age and older. °Monday - Tuesday (8am-5pm) °Most Wednesdays (8:30-5pm) °$30 per visit, cash only  °[facility] ° [address], [place] (336) 641-4533 Patients are seen by appointment only. Walk-ins are not accepted. [facility] will see patients 18 years of age and older. °One Wednesday Evening (Monthly: Volunteer Based).  $30 per visit, cash only  °[facility]  (919) 537-3737 for adults; Children under age 4, call Graduate Pediatric Dentistry at (919) [phone]. Children aged 4-14, please call (919) [phone] to request a pediatric application. ° Dental services are provided in all areas of dental care including fillings, crowns and bridges, complete and partial dentures, implants, gum treatment, root canals, and extractions.  Preventive care is also provided. Treatment is provided to both adults and children. °Patients are selected via a lottery and there is often a waiting list. °  °[facility] [address], °[place] ° (336) 763-8833 [email or web address] °  °[facility] [address], [place],  (336)723-1848, Ext. 123 Second and Fourth Thursday of each month, opens at 6:30 AM; Clinic ends at 9 AM.  Patients are seen on a first-come first-served basis, and a limited number are seen during each clinic.  ° °[facility] ° [address], [place], [place] (336) 723-7904   Eligibility Requirements °You must have lived in [place], [place], or [place] counties for at least the last three months. °  You cannot be eligible for state or federal sponsored healthcare insurance, including Veterans Administration, Medicaid, or Medicare. °  You generally cannot be eligible for healthcare insurance through your employer.  °  How to apply: °Eligibility screenings are held every Tuesday and Wednesday afternoon from 1:00 pm until 4:00 pm. You do not need an appointment for the interview!  °[facility] [address], [place], [place] 336-631-2330   °[facility]  336-342-8273   °[facility]  336-703-3100   °[facility]  336-570-6415   ° °Behavioral Health Resources in the Community: °Intensive Outpatient Programs °Organization         Address  Phone  Notes  °[facility] [address], [place], [place] 336-878-6098   °[facility] [address], [place], [place] 336-832-9800   °[facility] [address], [place], [place] ° 336-882-2125   °[facility] [address],  °[place], [place] 1-800-853-5163 or 336-641-4981   °Substance Abuse Resources °Organization         Address  Phone  Notes  °[facility]  336-882-2125   °[facility]  336-784-9470   °[facility]  336-285-9073   °[facility]  336-845-3988   °Residential & Outpatient Substance Abuse Program  1-800-659-3381   °Psychological Services °Organization         Address  Phone  Notes  °[facility]  336- 832-9600   °[facility]  336- 378-7881   °[facility] [address], [place] 1-800-853-5163 or 336-641-4981   ° °Mobile Crisis Teams °Organization         Address  Phone  Notes  °[facility], Mobile Crisis Care Unit  1-877-626-1772   °Assertive °[facility] ° [address] [place], [place] 336-834-9664   °Sharon DeEsch [address] °[place] 336-554-5454   ° °Self-Help/Support Groups °Organization         Address  Phone             Notes  °[facility] [place] - variety of support groups  336- 373-1402 Call for more information  °Narcotics Anonymous (NA), [facility] [address], °[place]  2 meetings at this location  ° °Residential Treatment Programs °Organization         Address  Phone  Notes  °[facility] [address],    °[place]  1-866-801-8205   °[facility] ° [address], [place], [place] 704-293-8524   °[facility] [address], [place] 336-845-3988 Admissions: 8am-3pm M-F  °[facility] [address]    °[place], [place] 336-841-1104   °[facility] [address], [place], [place] 336-379-7146   °[facility] [address]  °[place], [place] 336-285-9073   °[facility] Intensive Outpatient [address], [place], [place] 336-852-3033   °[facility] [address]  °[place], [place] 1-877-615-2722 or 336-784-9470   °[facility] [address] [place], [place] 336-227-7417 Accepts Medicaid  °[facility] [address]  ° [place]  1-800-659-3381 Substance Abuse/Addiction Treatment  ° °[place] Behavioral Health Resources °Organization           Address  Phone  Notes  °[facility]  (888) 581-9988   °Julie Brannon, PhD [address] [place], [place]   (336) 349-5553 or (336) 951-0000   °[facility]   [address] °[place], [place] (336) 349-4454   °[facility] [address], [place], [place] (336) 342-8316 Insurance/Medicaid/sponsorship through [facility]  °[facility] [address]                                    [place], [place] (336) 342-8316 Therapy/tele-psych/case  °[facility] [address]  ° [place], [place] (336) 349-2233    °Dr. Arfeen  (336) 349-4544   °[facility] [place]  [employer] [place] [facility] 1) [address], [place] °2) [address], [place] °3)  [address], [place] (336) 349-3220 °(336) 342-7768 ° °(336) 342-8140   °[place] [facility] (336) 342-1394 or (336) 342-3537 (After Hours)    ° ° ° °

## 2015-04-12 ENCOUNTER — Emergency Department (HOSPITAL_COMMUNITY): Payer: No Typology Code available for payment source

## 2015-04-12 ENCOUNTER — Emergency Department (HOSPITAL_COMMUNITY)
Admission: EM | Admit: 2015-04-12 | Discharge: 2015-04-12 | Disposition: A | Discharge status: Home / Self Care | Payer: No Typology Code available for payment source | Attending: Emergency Medicine | Admitting: Emergency Medicine

## 2015-04-12 ENCOUNTER — Encounter (HOSPITAL_COMMUNITY): Payer: Self-pay | Admitting: *Deleted

## 2015-04-12 DIAGNOSIS — J069 Acute upper respiratory infection, unspecified: Secondary | ICD-10-CM | POA: Insufficient documentation

## 2015-04-12 DIAGNOSIS — Z91040 Latex allergy status: Secondary | ICD-10-CM | POA: Insufficient documentation

## 2015-04-12 DIAGNOSIS — Z87891 Personal history of nicotine dependence: Secondary | ICD-10-CM | POA: Insufficient documentation

## 2015-04-12 DIAGNOSIS — Z79899 Other long term (current) drug therapy: Secondary | ICD-10-CM | POA: Insufficient documentation

## 2015-04-12 DIAGNOSIS — R05 Cough: Secondary | ICD-10-CM | POA: Insufficient documentation

## 2015-04-12 DIAGNOSIS — R0789 Other chest pain: Secondary | ICD-10-CM | POA: Insufficient documentation

## 2015-04-12 DIAGNOSIS — R0602 Shortness of breath: Secondary | ICD-10-CM | POA: Insufficient documentation

## 2015-04-12 DIAGNOSIS — Z791 Long term (current) use of non-steroidal anti-inflammatories (NSAID): Secondary | ICD-10-CM | POA: Insufficient documentation

## 2015-04-12 LAB — CBC
HCT: 39.6 % (ref 36.0–46.0)
HEMOGLOBIN: 13.3 g/dL (ref 12.0–15.0)
MCH: 29 pg (ref 26.0–34.0)
MCHC: 33.6 g/dL (ref 30.0–36.0)
MCV: 86.3 fL (ref 78.0–100.0)
PLATELETS: 217 10*3/uL (ref 150–400)
RBC: 4.59 MIL/uL (ref 3.87–5.11)
RDW: 13 % (ref 11.5–15.5)
WBC: 6.7 10*3/uL (ref 4.0–10.5)

## 2015-04-12 LAB — BASIC METABOLIC PANEL
Anion gap: 10 (ref 5–15)
BUN: 21 mg/dL — AB (ref 6–20)
CHLORIDE: 111 mmol/L (ref 101–111)
CO2: 23 mmol/L (ref 22–32)
CREATININE: 0.87 mg/dL (ref 0.44–1.00)
Calcium: 9.2 mg/dL (ref 8.9–10.3)
GFR calc Af Amer: >60 mL/min (ref >60)
GFR calc non Af Amer: >60 mL/min (ref >60)
GLUCOSE: 134 mg/dL — AB (ref 65–99)
Potassium: 3.5 mmol/L (ref 3.5–5.1)
SODIUM: 144 mmol/L (ref 135–145)

## 2015-04-12 LAB — I-STAT TROPONIN, ED: Troponin i, poc: 0 ng/mL (ref 0.00–0.08)

## 2015-04-12 LAB — D-DIMER, QUANTITATIVE: D-Dimer, Quant: 0.41 ug/mL-FEU (ref 0.00–0.48)

## 2015-04-12 MED ORDER — HYDROCODONE-HOMATROPINE 5-1.5 MG/5ML PO SYRP: 5.0000 mL | ORAL_SOLUTION | Freq: Four times a day (QID) | ORAL | Status: DC | PRN

## 2015-04-12 MED ORDER — KETOROLAC TROMETHAMINE 30 MG/ML IJ SOLN
30.0000 mg | Freq: Once | INTRAMUSCULAR | Status: AC
  Administered 2015-04-12: 30 mg via INTRAVENOUS
  Filled 2015-04-12: qty 1

## 2015-04-12 NOTE — ED Notes (Signed)
Pt states she has had left chest pain for 1 week. Pt states that she was given naproxen for pain when she was seen but states that pain has not improved. Pt states that pain will at times radiate to shoulders.

## 2015-04-12 NOTE — ED Provider Notes (Signed)
CSN: [id number]     Arrival date & time 04/12/15  1356 History   First MD Initiated Contact with Patient 04/12/15 1605     Chief Complaint  Patient presents with  . Chest Pain     (Consider location/radiation/quality/duration/timing/severity/associated sxs/prior Treatment) HPI Comments: Patient presents today with a chief complaint of chest pain.  Pain located left side of her chest and has been present since 04/01/15.  Pain does not radiate.  She states that the pain has been constant, but at times gets worse.  No association with exertion.  She states that the pain does get worse with deep breaths.  She was seen in the ED for similar pain on 04/01/15 and had a negative work up at that time.  She was diagnosed with Costochondritis and was given Rx for Naproxen.   She has been taking Naproxen for the pain without relief.  Pain associated with a dry cough and mild SOB.  She denies nausea, vomiting, LE pain/swelling, hemoptysis, dizziness, syncope, numbness, or tingling.  She denies history of PE or DVT.  No prolonged travel or surgeries in the past 4 weeks.  She is currently on Estradiol.    Patient is a 34 y.o. female presenting with chest pain. The history is provided by the patient.  Chest Pain   History reviewed. No pertinent past medical history. Past Surgical History  Procedure Laterality Date  . Appendectomy    . Abdominal hysterectomy    . Tonsillectomy     No family history on file. History  Substance Use Topics  . Smoking status: Former Games developer  . Smokeless tobacco: Not on file  . Alcohol Use: No   OB History    No data available     Review of Systems  Cardiovascular: Positive for chest pain.  All other systems reviewed and are negative.     Allergies  Latex  Home Medications   Prior to Admission medications   Medication Sig Start Date End Date Taking? Authorizing Provider  estradiol (ESTRACE) 2 MG tablet Take 2 mg by mouth 2 (two) times daily.     Historical  Provider, MD  HYDROcodone-acetaminophen (NORCO/VICODIN) 5-325 MG per tablet Take 1 tablet by mouth every 6 (six) hours as needed for moderate pain. Patient not taking: Reported on 04/01/2015 02/12/14   Charlestine Night, PA-C  ibuprofen (ADVIL,MOTRIN) 200 MG tablet Take 400 mg by mouth daily as needed (pain).    Historical Provider, MD  ibuprofen (ADVIL,MOTRIN) 800 MG tablet Take 1 tablet (800 mg total) by mouth every 8 (eight) hours as needed. Patient not taking: Reported on 04/01/2015 02/12/14   Charlestine Night, PA-C  naproxen (NAPROSYN) 500 MG tablet Take 1 tablet (500 mg total) by mouth 2 (two) times daily. 04/02/15   Arby Barrette, MD  OVER THE COUNTER MEDICATION Take 1 tablet by mouth daily with lunch. Fat fighter tablet by [employer]    Ecologist, MD  OVER THE COUNTER MEDICATION Take 1 tablet by mouth daily. [place] by [employer]    Ecologist, MD  traMADol (ULTRAM) 50 MG tablet Take 1 tablet (50 mg total) by mouth every 6 (six) hours as needed. Patient not taking: Reported on 04/01/2015 02/18/14   Teressa Lower, NP   BP 123/73 mmHg  Pulse 77  Temp(Src) 97.5 F (36.4 C) (Oral)  Resp 16  Ht  (1.676 m)  Wt 181 lb (82.101 kg)  BMI 29.23 kg/m2  SpO2 97% Physical Exam  Constitutional: She appears well-developed  and well-nourished.  HENT:  Head: Normocephalic and atraumatic.  Mouth/Throat: Oropharynx is clear and moist.  Neck: Normal range of motion. Neck supple.  Cardiovascular: Normal rate, regular rhythm and normal heart sounds.   Pulmonary/Chest: Effort normal and breath sounds normal. No respiratory distress. She has no wheezes. She has no rales. She exhibits tenderness.  Abdominal: Soft. There is no tenderness.  Musculoskeletal: Normal range of motion.  No LE erythema or edema  Neurological: She is alert.  Skin: Skin is warm and dry. She is not diaphoretic.  Psychiatric: She has a normal mood and affect.  Nursing note and vitals reviewed.   ED Course   Procedures (including critical care time) Labs Review Labs Reviewed  BASIC METABOLIC PANEL - Abnormal; Notable for the following:    Glucose, Bld 134 (*)    BUN 21 (*)    All other components within normal limits  CBC  D-DIMER, QUANTITATIVE (NOT AT [facility])  Rosezena Sensor, ED    Imaging Review Dg Chest 2 View  04/12/2015   CLINICAL DATA:  Chest pain, shortness of Breath  EXAM: CHEST  2 VIEW  COMPARISON:  04/01/2015  FINDINGS: Cardiomediastinal silhouette is stable. Mild elevation of the right hemidiaphragm again noted. No acute infiltrate or pleural effusion. No pulmonary edema. Bony thorax is unremarkable.  IMPRESSION: No active disease. Elevation of the right hemidiaphragm again noted.   Electronically Signed   By: Natasha Mead M.D.   On: 04/12/2015 14:50     EKG Interpretation   Date/Time:  Thursday April 12 2015 14:02:18 EDT Ventricular Rate:  79 PR Interval:  176 QRS Duration: 108 QT Interval:  380 QTC Calculation: 435 R Axis:   104 Text Interpretation:  Normal sinus rhythm Incomplete right bundle branch  block Possible Right ventricular hypertrophy Nonspecific T wave  abnormality Abnormal ECG No significant change was found Confirmed by  Manus Gunning  MD, STEPHEN [phone]) on 04/12/2015 5:51:27 PM      MDM   Final diagnoses:  Chest wall pain  URI (upper respiratory infection)   Patient presents today with a chief complaint of atypical chest pain that has been present since 04/01/15.  Chest wall tender to palpation.  Pulse ox 97 on RA.  CXR is negative.  D-dimer is negative.  No ischemic changes on EKG.  Troponin negative.  Patient is stable for discharge.  Return precautions given.   Santiago Glad, PA-C 04/14/15 1617  Pricilla Loveless, MD 04/15/15 [id number]

## 2015-04-16 ENCOUNTER — Ambulatory Visit (INDEPENDENT_AMBULATORY_CARE_PROVIDER_SITE_OTHER): Payer: No Typology Code available for payment source | Admitting: Family Medicine

## 2015-04-16 VITALS — BP 120/74 | HR 67 | Temp 98.3°F | Resp 16 | Ht 65.0 in | Wt 184.0 lb

## 2015-04-16 DIAGNOSIS — R0789 Other chest pain: Secondary | ICD-10-CM

## 2015-04-16 DIAGNOSIS — Z7189 Other specified counseling: Secondary | ICD-10-CM

## 2015-04-16 DIAGNOSIS — Z7689 Persons encountering health services in other specified circumstances: Secondary | ICD-10-CM

## 2015-04-16 MED ORDER — TRAMADOL HCL 50 MG PO TABS: ORAL_TABLET | ORAL | Status: DC

## 2015-04-16 NOTE — Patient Instructions (Signed)
Continue naproxen Take Tramadol at bedtime Hot compresses to chest wall No heavy pushing, pulling, lifting with arms.

## 2015-04-16 NOTE — Progress Notes (Signed)
Patient ID: Jordan Ibarra, female   DOB: 09/06/1981, 34 y.o.   MRN: [id number]   Jordan Ibarra, is a 34 y.o. female  [id number]  [id number]  DOB - 21-Mar-1981  CC:  Chief Complaint  Patient presents with  . Establish Care    mid chest pain        HPI: Jordan Ibarra is a 34 y.o. female here to establish care and c/o today of chest pain. She has been seen in ED 2 times recently for same and has had cardiac cause ruled out and has a diagnosis of chest wall pain/costocondritis. She has been treate through the ED with opoids.  On her last visit she was also diagnosed with upper respiratory infection but she was unaware of that and denies any upper respiratory symptoms. The pain she is experiencing it keeping her from sleeping well and causing her to be fatigued during the day.  She is taking naproxen which denies is helping.  She has a history of crack cocaine abuse and well as injectable cocaine use, none in last 6 years. She has a history of hysterectomy for cancer prevention due to a strong family history.  She is follow-ed from repeat PAP yearly at [facility]. She is on Estrace due to the hysterectomy Allergies  Allergen Reactions  . Latex Rash   No past medical history on file. Current Outpatient Prescriptions on File Prior to Visit  Medication Sig Dispense Refill  . estradiol (ESTRACE) 2 MG tablet Take 2 mg by mouth 2 (two) times daily.     . naproxen (NAPROSYN) 500 MG tablet Take 1 tablet (500 mg total) by mouth 2 (two) times daily. 30 tablet 0  . HYDROcodone-acetaminophen (NORCO/VICODIN) 5-325 MG per tablet Take 1 tablet by mouth every 6 (six) hours as needed for moderate pain. (Patient not taking: Reported on 04/01/2015) 15 tablet 0  . HYDROcodone-homatropine (HYCODAN) 5-1.5 MG/5ML syrup Take 5 mLs by mouth every 6 (six) hours as needed for cough. (Patient not taking: Reported on 04/16/2015) 120 mL 0  . ibuprofen (ADVIL,MOTRIN) 200 MG tablet Take 400 mg by mouth  daily as needed (pain).    Marland Kitchen ibuprofen (ADVIL,MOTRIN) 800 MG tablet Take 1 tablet (800 mg total) by mouth every 8 (eight) hours as needed. (Patient not taking: Reported on 04/01/2015) 21 tablet 0  . OVER THE COUNTER MEDICATION Take 1 tablet by mouth daily with lunch. Fat fighter tablet by [employer]    . OVER THE COUNTER MEDICATION Take 1 tablet by mouth daily. The Greens by [employer]    . traMADol (ULTRAM) 50 MG tablet Take 1 tablet (50 mg total) by mouth every 6 (six) hours as needed. (Patient not taking: Reported on 04/01/2015) 15 tablet 0   No current facility-administered medications on file prior to visit.   No family history on file. History   Social History  . Marital Status: Legally Separated    Spouse Name: N/A  . Number of Children: N/A  . Years of Education: N/A   Occupational History  . Not on file.   Social History Main Topics  . Smoking status: Former Games developer  . Smokeless tobacco: Not on file  . Alcohol Use: No  . Drug Use: No  . Sexual Activity: Not on file   Other Topics Concern  . Not on file   Social History Narrative    Review of Systems: Constitutional: Negative for fever, chills, appetite change, weight loss. Positive for fatigue due to lack of sleep HENT: Negative  for ear pain, ear discharge.nose bleeds Eyes: Negative for pain, discharge, redness, itching and visual disturbance. Neck: Negative for pain, stiffness Respiratory: Negative for cough, shortness of breath,   Cardiovascular: Negative for cardiac chest pain, palpitations and leg swelling. Gastrointestinal: Negative for abdominal distention, abdominal pain, nausea, vomiting, diarrhea, constipations Genitourinary: Negative for dysuria, urgency, frequency, hematuria, flank pain,  Musculoskeletal: Negative for back pain, joint pain, joint  swelling, arthralgia and gait problem.Negative for weakness. Positive for chest wall pain Neurological: Negative for dizziness, tremors, seizures, syncope,    light-headedness, numbness and headaches.  Hematological: Negative for easy bleeding but positive for easy bruising. Psychiatric/Behavioral: Negative for depression, anxiety, decreased concentration, confusion    Objective:   Filed Vitals:   04/16/15 1447  BP: 120/74  Pulse: 67  Temp: 98.3 F (36.8 C)  Resp: 16    Physical Exam: Constitutional: Patient appears well-developed and well-nourished. No distress. HENT: Normocephalic, atraumatic, External right and left ear normal. Oropharynx is clear and moist.  Eyes: Conjunctivae and EOM are normal. PERRLA, no scleral icterus. Neck: Normal ROM. Neck supple. No lymphadenopathy, No thyromegaly. CVS: RRR, S1/S2 +, no murmurs, no gallops, no rubs Pulmonary: Effort and breath sounds normal, no stridor, rhonchi, wheezes, rales.  Abdominal: Soft. Normoactive BS,, no distension, tenderness, rebound or guarding.  Musculoskeletal: Normal range of motion. No edema and no tenderness. There is tenderness over the left and mid upper chest. Neuro: Alert.Normal muscle tone coordination. Non-focal Skin: Skin is warm and dry. No rash noted. Not diaphoretic. No erythema. No pallor. Psychiatric: Normal mood and affect. Behavior, judgment, thought content normal.  Lab Results  Component Value Date   WBC 6.7 04/12/2015   HGB 13.3 04/12/2015   HCT 39.6 04/12/2015   MCV 86.3 04/12/2015   PLT 217 04/12/2015   Lab Results  Component Value Date   CREATININE 0.87 04/12/2015   BUN 21* 04/12/2015   NA 144 04/12/2015   K 3.5 04/12/2015   CL 111 04/12/2015   CO2 23 04/12/2015    Lab Results  Component Value Date   HGBA1C  05/19/2009    5.4 (NOTE) The ADA recommends the following therapeutic goal for glycemic control related to Hgb A1c measurement: Goal of therapy: <6.5 Hgb A1c  Reference: American Diabetes Association: Clinical Practice Recommendations 2010, Diabetes Care, 2010, 33: (Suppl  1).   Lipid Panel     Component Value Date/Time   CHOL   05/20/2009 0400    86        ATP III CLASSIFICATION:  <200     mg/dL   Desirable  960-454  mg/dL   Borderline High  >=098    mg/dL   High              Assessment and plan:   Establishment of care -I have reviewed available histories provided by the patient as well as pertinent record from chart. -She has had recent bloodwork in ED and there is nothing that needs repeating at this time - There are other health maintenance issues that need to be addressed. -I have asked her to return in one month to address these heath maintenance issues   Chest Wall pain -Continue naproxen 500 bid -I am adding Tramadol 50 mg, #30, to be taken at bed time.  Follow-up in one month to re-evaluate Chest wall pain and review health maintenance.   The patient was given clear instructions to go to ER or return to medical center if symptoms don't improve, worsen or new problems  develop. The patient verbalized understanding. The patient was told to call to get lab results if they haven't heard anything in the next week.       Henrietta Hoover, MSN, FNP-BC   04/16/2015, 3:04 PM

## 2015-04-17 ENCOUNTER — Encounter: Payer: Self-pay | Admitting: Family Medicine

## 2015-04-17 DIAGNOSIS — R0789 Other chest pain: Secondary | ICD-10-CM | POA: Insufficient documentation

## 2015-04-29 ENCOUNTER — Encounter (HOSPITAL_COMMUNITY): Payer: Self-pay | Admitting: *Deleted

## 2015-04-29 ENCOUNTER — Emergency Department (HOSPITAL_COMMUNITY)
Admission: EM | Admit: 2015-04-29 | Discharge: 2015-04-29 | Disposition: A | Discharge status: Home / Self Care | Payer: No Typology Code available for payment source | Attending: Emergency Medicine | Admitting: Emergency Medicine

## 2015-04-29 DIAGNOSIS — Z91040 Latex allergy status: Secondary | ICD-10-CM | POA: Insufficient documentation

## 2015-04-29 DIAGNOSIS — Z79899 Other long term (current) drug therapy: Secondary | ICD-10-CM | POA: Insufficient documentation

## 2015-04-29 DIAGNOSIS — Z90710 Acquired absence of both cervix and uterus: Secondary | ICD-10-CM | POA: Insufficient documentation

## 2015-04-29 DIAGNOSIS — R109 Unspecified abdominal pain: Secondary | ICD-10-CM | POA: Insufficient documentation

## 2015-04-29 DIAGNOSIS — Z9089 Acquired absence of other organs: Secondary | ICD-10-CM | POA: Insufficient documentation

## 2015-04-29 DIAGNOSIS — Z720 Tobacco use: Secondary | ICD-10-CM | POA: Insufficient documentation

## 2015-04-29 LAB — URINALYSIS, ROUTINE W REFLEX MICROSCOPIC
BILIRUBIN URINE: NEGATIVE
Glucose, UA: NEGATIVE mg/dL
Hgb urine dipstick: NEGATIVE
Ketones, ur: NEGATIVE mg/dL
Leukocytes, UA: NEGATIVE
NITRITE: NEGATIVE
PROTEIN: NEGATIVE mg/dL
Specific Gravity, Urine: 1.017 (ref 1.005–1.030)
UROBILINOGEN UA: 0.2 mg/dL (ref 0.0–1.0)
pH: 7.5 (ref 5.0–8.0)

## 2015-04-29 LAB — BASIC METABOLIC PANEL
Anion gap: 10 (ref 5–15)
BUN: 18 mg/dL (ref 6–20)
CO2: 27 mmol/L (ref 22–32)
Calcium: 9.4 mg/dL (ref 8.9–10.3)
Chloride: 106 mmol/L (ref 101–111)
Creatinine, Ser: 1.05 mg/dL — ABNORMAL HIGH (ref 0.44–1.00)
GFR calc Af Amer: >60 mL/min (ref >60)
Glucose, Bld: 90 mg/dL (ref 65–99)
Potassium: 4.1 mmol/L (ref 3.5–5.1)
Sodium: 143 mmol/L (ref 135–145)

## 2015-04-29 LAB — CBC
HCT: 40.3 % (ref 36.0–46.0)
Hemoglobin: 13.4 g/dL (ref 12.0–15.0)
MCH: 28.6 pg (ref 26.0–34.0)
MCHC: 33.3 g/dL (ref 30.0–36.0)
MCV: 86.1 fL (ref 78.0–100.0)
Platelets: 280 10*3/uL (ref 150–400)
RBC: 4.68 MIL/uL (ref 3.87–5.11)
RDW: 12.8 % (ref 11.5–15.5)
WBC: 7.7 10*3/uL (ref 4.0–10.5)

## 2015-04-29 NOTE — Discharge Instructions (Signed)
Flank Pain °Flank pain refers to pain that is located on the side of the body between the upper abdomen and the back. The pain may occur over a short period of time (acute) or may be long-term or reoccurring (chronic). It may be mild or severe. Flank pain can be caused by many things. °CAUSES  °Some of the more common causes of flank pain include: °· Muscle strains.   °· Muscle spasms.   °· A disease of your spine (vertebral disk disease).   °· A lung infection (pneumonia).   °· Fluid around your lungs (pulmonary edema).   °· A kidney infection.   °· Kidney stones.   °· A very painful skin rash caused by the chickenpox virus (shingles).   °· Gallbladder disease.   °HOME CARE INSTRUCTIONS  °Home care will depend on the cause of your pain. In general, °· Rest as directed by your caregiver. °· Drink enough fluids to keep your urine clear or pale yellow. °· Only take over-the-counter or prescription medicines as directed by your caregiver. Some medicines may help relieve the pain. °· Tell your caregiver about any changes in your pain. °· Follow up with your caregiver as directed. °SEEK IMMEDIATE MEDICAL CARE IF:  °· Your pain is not controlled with medicine.   °· You have new or worsening symptoms. °· Your pain increases.   °· You have abdominal pain.   °· You have shortness of breath.   °· You have persistent nausea or vomiting.   °· You have swelling in your abdomen.   °· You feel faint or pass out.   °· You have blood in your urine. °· You have a fever or persistent symptoms for more than 2-3 days. °· You have a fever and your symptoms suddenly get worse. °MAKE SURE YOU:  °· Understand these instructions. °· Will watch your condition. °· Will get help right away if you are not doing well or get worse. °Document Released: 10/16/2005 Document Revised: 05/19/2012 Document Reviewed: 04/08/2012 °ExitCare® Patient Information ©2015 [employer]. This information is not intended to replace advice given to you by your  health care provider. Make sure you discuss any questions you have with your health care provider. ° °

## 2015-04-29 NOTE — ED Notes (Signed)
Pt reports L flank pain since yesterday.  Pt denies any n/v or any urinary sxs at this time.

## 2015-04-29 NOTE — ED Provider Notes (Signed)
CSN: [id number]     Arrival date & time 04/29/15  1943 History   First MD Initiated Contact with Patient 04/29/15 2043     Chief Complaint  Patient presents with  . Flank Pain     (Consider location/radiation/quality/duration/timing/severity/associated sxs/prior Treatment) HPI Comments: Pt comes in with c/o left flank pain since yesterday. No fever, abdominal pain, vomiting or diarrhea. Nothing makes the pain better or worse. She tried ultram without relief. No vaginal discharge. She states that she has a history of kidney infection and if seems similar. Denies history of kidney stone. She states that she tried cranberry juice without relief  The history is provided by the patient. No language interpreter was used.    History reviewed. No pertinent past medical history. Past Surgical History  Procedure Laterality Date  . Appendectomy    . Abdominal hysterectomy    . Tonsillectomy     No family history on file. Social History  Substance Use Topics  . Smoking status: Current Every Day Smoker    Types: Cigarettes  . Smokeless tobacco: None  . Alcohol Use: Yes     Comment: occ   OB History    No data available     Review of Systems  All other systems reviewed and are negative.     Allergies  Latex  Home Medications   Prior to Admission medications   Medication Sig Start Date End Date Taking? Authorizing Provider  estradiol (ESTRACE) 2 MG tablet Take 2 mg by mouth 2 (two) times daily.    Yes Historical Provider, MD  ibuprofen (ADVIL,MOTRIN) 200 MG tablet Take 400 mg by mouth daily as needed (pain).   Yes Historical Provider, MD  OVER THE COUNTER MEDICATION Take 1 tablet by mouth daily with lunch. Fat fighter tablet by [employer]   Yes Ecologist, MD  OVER THE COUNTER MEDICATION Take 1 each by mouth daily. [employer] Greens by [employer]-- mix with juice   Yes Historical Provider, MD  traMADol (ULTRAM) 50 MG tablet Once a day at bedtime. Patient taking differently: Take  50 mg by mouth daily as needed for moderate pain.  04/16/15  Yes Henrietta Hoover, NP  HYDROcodone-homatropine [facility]) 5-1.5 MG/5ML syrup Take 5 mLs by mouth every 6 (six) hours as needed for cough. Patient not taking: Reported on 04/16/2015 04/12/15   Santiago Glad, PA-C  naproxen (NAPROSYN) 500 MG tablet Take 1 tablet (500 mg total) by mouth 2 (two) times daily. Patient not taking: Reported on 04/29/2015 04/02/15   Arby Barrette, MD   BP 112/52 mmHg  Pulse 66  Temp(Src) 98.1 F (36.7 C)  Resp 18  Ht 5\' 6"  (1.676 m)  Wt 179 lb (81.194 kg)  BMI 28.91 kg/m2  SpO2 96% Physical Exam  Constitutional: She is oriented to person, place, and time. She appears well-developed and well-nourished.  HENT:  Head: Normocephalic and atraumatic.  Cardiovascular: Normal rate and regular rhythm.   Pulmonary/Chest: Effort normal and breath sounds normal.  Abdominal: Soft. Bowel sounds are normal. There is no tenderness. There is no CVA tenderness.  Musculoskeletal: Normal range of motion.  Neurological: She is alert and oriented to person, place, and time.  Skin: Skin is warm and dry.  Psychiatric: She has a normal mood and affect.  Nursing note and vitals reviewed.   ED Course  Procedures (including critical care time) Labs Review Labs Reviewed  URINALYSIS, ROUTINE W REFLEX MICROSCOPIC (NOT AT [facility]) - Abnormal; Notable for the following:  APPearance CLOUDY (*)    All other components within normal limits  BASIC METABOLIC PANEL - Abnormal; Notable for the following:    Creatinine, Ser 1.05 (*)    All other components within normal limits  CBC    Imaging Review No results found. I have personally reviewed and evaluated these images and lab results as part of my medical decision-making.   EKG Interpretation None      MDM   Final diagnoses:  Flank pain    Pt is comfortable in appearance. Urine is negative for infection. Doubt kidney stone.discussed follow up with urology as needed.  Pt given return precautions    Teressa Lower, NP 04/29/15 1610  Benjiman Core, MD 04/30/15 [phone]

## 2015-05-17 ENCOUNTER — Ambulatory Visit (INDEPENDENT_AMBULATORY_CARE_PROVIDER_SITE_OTHER): Payer: No Typology Code available for payment source | Admitting: Family Medicine

## 2015-05-17 VITALS — BP 117/62 | HR 64 | Temp 97.6°F | Resp 16 | Ht 65.0 in | Wt 185.0 lb

## 2015-05-17 DIAGNOSIS — R300 Dysuria: Secondary | ICD-10-CM

## 2015-05-17 DIAGNOSIS — Z0000 Encounter for general adult medical examination without abnormal findings: Secondary | ICD-10-CM

## 2015-05-17 DIAGNOSIS — R3915 Urgency of urination: Secondary | ICD-10-CM

## 2015-05-17 DIAGNOSIS — Z23 Encounter for immunization: Secondary | ICD-10-CM

## 2015-05-17 LAB — POCT URINALYSIS DIP (DEVICE)
BILIRUBIN URINE: NEGATIVE
GLUCOSE, UA: NEGATIVE mg/dL
Hgb urine dipstick: NEGATIVE
Ketones, ur: NEGATIVE mg/dL
Leukocytes, UA: NEGATIVE
NITRITE: NEGATIVE
Protein, ur: NEGATIVE mg/dL
Specific Gravity, Urine: >=1.03 (ref 1.005–1.030)
Urobilinogen, UA: 0.2 mg/dL (ref 0.0–1.0)
pH: 6 (ref 5.0–8.0)

## 2015-05-17 LAB — LIPID PANEL
Cholesterol: 197 mg/dL (ref 125–200)
HDL: 38 mg/dL — ABNORMAL LOW (ref >=46)
LDL CALC: 115 mg/dL (ref <130)
Total CHOL/HDL Ratio: 5.2 Ratio — ABNORMAL HIGH (ref <=5.0)
Triglycerides: 219 mg/dL — ABNORMAL HIGH (ref <150)
VLDL: 44 mg/dL — AB (ref <30)

## 2015-05-17 LAB — TSH: TSH: 1.825 u[IU]/mL (ref 0.350–4.500)

## 2015-05-17 NOTE — Patient Instructions (Addendum)

## 2015-05-17 NOTE — Progress Notes (Signed)
Patient ID: Jordan Ibarra, female   DOB: [birth year], 34 y.o.   MRN: [id number]   Jordan Ibarra, is a 34 y.o. female  [id number]:[id number]  [id number]  DOB - [birth year]  CC:  Chief Complaint  Patient presents with  . Follow-up    chest wall pain/ back pain when drinking water  . Urinary Incontinence         HPI: Jordan Ibarra is a 34 y.o. female here for  follow-up of chest wall pain and to address health maintenance needs. She reports her chest wall pain is much better. She does report some flank pain and urinary urgency and wonders if she has an UTI. She has a history of a hysterectomy due to a strong family history of cervical cancer and is followed at [facility], where she has an appointment in November. She is in need of a Tdap and flu vaccine today. She does  Smoke cigarettes but is working on cutting down gradually to quit. She has recently purchased a treadmill and is exercising regularly and tries to eat a healthy diet. She has a history of drug abuse but has been clean for 6 years.   Allergies  Allergen Reactions  . Latex Rash   No past medical history on file. Current Outpatient Prescriptions on File Prior to Visit  Medication Sig Dispense Refill  . estradiol (ESTRACE) 2 MG tablet Take 2 mg by mouth 2 (two) times daily.     Marland Kitchen ibuprofen (ADVIL,MOTRIN) 200 MG tablet Take 400 mg by mouth daily as needed (pain).    Marland Kitchen HYDROcodone-homatropine (HYCODAN) 5-1.5 MG/5ML syrup Take 5 mLs by mouth every 6 (six) hours as needed for cough. (Patient not taking: Reported on 04/16/2015) 120 mL 0  . naproxen (NAPROSYN) 500 MG tablet Take 1 tablet (500 mg total) by mouth 2 (two) times daily. (Patient not taking: Reported on 04/29/2015) 30 tablet 0  . OVER THE COUNTER MEDICATION Take 1 tablet by mouth daily with lunch. Fat fighter tablet by [employer]    . OVER THE COUNTER MEDICATION Take 1 each by mouth daily. [employer] Greens by [employer]-- mix with juice    . traMADol (ULTRAM) 50 MG tablet  Once a day at bedtime. (Patient not taking: Reported on 05/17/2015) 15 tablet 0   No current facility-administered medications on file prior to visit.   No family history on file. Social History   Social History  . Marital Status: Legally Separated    Spouse Name: N/A  . Number of Children: N/A  . Years of Education: N/A   Occupational History  . Not on file.   Social History Main Topics  . Smoking status: Current Every Day Smoker    Types: Cigarettes  . Smokeless tobacco: Not on file  . Alcohol Use: Yes     Comment: occ  . Drug Use: No  . Sexual Activity: Not on file   Other Topics Concern  . Not on file   Social History Narrative    Review of Systems: Constitutional: Negative for weight change, appetite change. Positive for fatigue. Negative for chills, fever Skin: Negative for ulcerations. Ears: Negative for pain, discharge or decreased hearing. Eyes: Negative pain, visual disturbance Respiratory: Negative for cough, shortness of breath, wheezing. Cardiovascular: Negative for chest pain, palpitations, pedal edema  Abdominal:Negative for nausea, vomiting, diarhea,constipation, abdominal pain Genitourinary: Negative for frequency, polyuria. Positive for urgency, some burning and flank pain. Neurological: Negative for numbness, tingling, dizziness, fainting, seizures or headaches. Psychiatric/Behavioral: Negative for depression, anxiety, confusion  Hematologic: negative for easy bleeding, positive for perceived easy bruising.    Objective:   Filed Vitals:   05/17/15 0842  BP: 117/62  Pulse: 64  Temp: 97.6 F (36.4 C)  Resp: 16    Physical Exam: Constitutional: Patient appears well-developed and well-nourished. No distress. HENT: Normocephalic, atraumatic. Oropharynx is clear and moist.  Eyes: Conjunctivae and EOM are normal. PERRLA, no scleral icterus. Neck: Normal ROM. Neck supple. No lymphadenopathy, No thyromegaly. CVS: RRR, S1/S2 +, no murmurs, no  gallops, no rubs Pulmonary: Effort and breath sounds normal, no stridor, rhonchi, wheezes, rales.  Abdominal: Soft. Normoactive BS,, no distension, tenderness, rebound or guarding.  Musculoskeletal: Normal range of motion. No edema and no tenderness.  Neuro: Alert.Normal muscle tone coordination. Non-focal Skin: Skin is warm and dry. No rash noted. Not diaphoretic. No erythema. No pallor. Psychiatric: Normal mood and affect. Behavior, judgment, thought content normal.  Lab Results  Component Value Date   WBC 7.7 04/29/2015   HGB 13.4 04/29/2015   HCT 40.3 04/29/2015   MCV 86.1 04/29/2015   PLT 280 04/29/2015   Lab Results  Component Value Date   CREATININE 1.05* 04/29/2015   BUN 18 04/29/2015   NA 143 04/29/2015   K 4.1 04/29/2015   CL 106 04/29/2015   CO2 27 04/29/2015    Lab Results  Component Value Date   HGBA1C  05/19/2009    5.4 (NOTE) The ADA recommends the following therapeutic goal for glycemic control related to Hgb A1c measurement: Goal of therapy: <6.5 Hgb A1c  Reference: American Diabetes Association: Clinical Practice Recommendations 2010, Diabetes Care, 2010, 33: (Suppl  1).   Lipid Panel     Component Value Date/Time   CHOL  05/20/2009 0400    86        ATP III CLASSIFICATION:  <200     mg/dL   Desirable  161-096  mg/dL   Borderline High  >=045    mg/dL   High              Assessment and plan:      1. Health maintenance examination  - TSH - Lipid panel - Vitamin D, 25-hydroxy -Tdap -influenza vaccine.   2. Urinary urgency  - Urinalysis Dipstick - Urine culture  3. Dysuria - urinalysis -urine culture.   Return in about 6 months (around 11/14/2015).  The patient was given clear instructions to go to ER or return to medical center if symptoms don't improve, worsen or new problems develop. The patient verbalized understanding       05/17/2015, 9:24 AM

## 2015-05-18 ENCOUNTER — Encounter: Payer: Self-pay | Admitting: Family Medicine

## 2015-05-18 LAB — VITAMIN D 25 HYDROXY (VIT D DEFICIENCY, FRACTURES): Vit D, 25-Hydroxy: 33 ng/mL (ref 30–100)

## 2015-05-21 ENCOUNTER — Other Ambulatory Visit: Payer: Self-pay | Admitting: Family Medicine

## 2015-05-21 ENCOUNTER — Telehealth: Payer: Self-pay

## 2015-05-21 LAB — URINE CULTURE: Colony Count: >=100000

## 2015-05-21 MED ORDER — SULFAMETHOXAZOLE-TRIMETHOPRIM 800-160 MG PO TABS: 1.0000 | ORAL_TABLET | Freq: Two times a day (BID) | ORAL | Status: DC

## 2015-05-21 NOTE — Telephone Encounter (Signed)
-----   Message from Henrietta Hoover, NP sent at 05/21/2015  8:26 AM EDT ----- Urine culture does show an infection. Will send in antibiotic. What drug store?

## 2015-05-21 NOTE — Telephone Encounter (Signed)
-----   Message from Henrietta Hoover, NP sent at 05/18/2015  8:01 AM EDT ----- Vitamin D and TSH are normal. Cholesterol and Triglycerides slightly elevated. Would attempt to manage this with a low fat, low cholesterol diet and regular exercise. Recheck in 6 months.

## 2015-05-21 NOTE — Telephone Encounter (Signed)
Spoke with Patient advised of normal vitamin D and TSH. Advised patient of elevated cholesterol and triglycerides, to start low fat/cholesterol diet and get exercise regularly. Patient verbalized understanding. Thanks !

## 2015-05-21 NOTE — Telephone Encounter (Signed)
Called and advised patient of infection and she request antibiotic be sent into [facility] on [address]. Thanks!

## 2015-05-24 ENCOUNTER — Other Ambulatory Visit: Payer: Self-pay | Admitting: Family Medicine

## 2015-08-25 ENCOUNTER — Encounter (HOSPITAL_COMMUNITY): Payer: Self-pay | Admitting: Emergency Medicine

## 2015-08-25 ENCOUNTER — Emergency Department (HOSPITAL_COMMUNITY)
Admission: EM | Admit: 2015-08-25 | Discharge: 2015-08-25 | Disposition: A | Discharge status: Home / Self Care | Payer: No Typology Code available for payment source | Attending: Emergency Medicine | Admitting: Emergency Medicine

## 2015-08-25 DIAGNOSIS — R2233 Localized swelling, mass and lump, upper limb, bilateral: Secondary | ICD-10-CM | POA: Insufficient documentation

## 2015-08-25 DIAGNOSIS — M7989 Other specified soft tissue disorders: Secondary | ICD-10-CM

## 2015-08-25 DIAGNOSIS — Z79899 Other long term (current) drug therapy: Secondary | ICD-10-CM | POA: Insufficient documentation

## 2015-08-25 DIAGNOSIS — F17210 Nicotine dependence, cigarettes, uncomplicated: Secondary | ICD-10-CM | POA: Insufficient documentation

## 2015-08-25 DIAGNOSIS — Z91040 Latex allergy status: Secondary | ICD-10-CM | POA: Insufficient documentation

## 2015-08-25 LAB — URINALYSIS, ROUTINE W REFLEX MICROSCOPIC
BILIRUBIN URINE: NEGATIVE
GLUCOSE, UA: NEGATIVE mg/dL
HGB URINE DIPSTICK: NEGATIVE
KETONES UR: NEGATIVE mg/dL
LEUKOCYTES UA: NEGATIVE
Nitrite: NEGATIVE
PH: 5.5 (ref 5.0–8.0)
PROTEIN: NEGATIVE mg/dL
Specific Gravity, Urine: 1.028 (ref 1.005–1.030)

## 2015-08-25 LAB — I-STAT CHEM 8, ED
BUN: 16 mg/dL (ref 6–20)
CALCIUM ION: 1.17 mmol/L (ref 1.12–1.23)
CHLORIDE: 108 mmol/L (ref 101–111)
CREATININE: 0.7 mg/dL (ref 0.44–1.00)
Glucose, Bld: 100 mg/dL — ABNORMAL HIGH (ref 65–99)
HCT: 41 % (ref 36.0–46.0)
Hemoglobin: 13.9 g/dL (ref 12.0–15.0)
POTASSIUM: 4.2 mmol/L (ref 3.5–5.1)
Sodium: 144 mmol/L (ref 135–145)
TCO2: 25 mmol/L (ref 0–100)

## 2015-08-25 LAB — RAPID URINE DRUG SCREEN, HOSP PERFORMED
Amphetamines: NOT DETECTED
BARBITURATES: NOT DETECTED
BENZODIAZEPINES: NOT DETECTED
Cocaine: NOT DETECTED
Opiates: NOT DETECTED
Tetrahydrocannabinol: NOT DETECTED

## 2015-08-25 MED ORDER — HYDROCODONE-ACETAMINOPHEN 5-325 MG PO TABS
2.0000 | ORAL_TABLET | Freq: Once | ORAL | Status: AC
  Administered 2015-08-25: 2 via ORAL
  Filled 2015-08-25: qty 2

## 2015-08-25 MED ORDER — NAPROXEN 500 MG PO TABS: 500.0000 mg | ORAL_TABLET | Freq: Two times a day (BID) | ORAL | Status: DC

## 2015-08-25 NOTE — Discharge Instructions (Signed)
Continue taking Naproxen as prescribed for pain. Follow up with your primary care doctor for further evaluation of your symptoms. Return to the ED as needed if symptoms worsen.  Peripheral Edema You have swelling in your legs (peripheral edema). This swelling is due to excess accumulation of salt and water in your body. Edema may be a sign of heart, kidney or liver disease, or a side effect of a medication. It may also be due to problems in the leg veins. Elevating your legs and using special support stockings may be very helpful, if the cause of the swelling is due to poor venous circulation. Avoid long periods of standing, whatever the cause. Treatment of edema depends on identifying the cause. Chips, pretzels, pickles and other salty foods should be avoided. Restricting salt in your diet is almost always needed. Water pills (diuretics) are often used to remove the excess salt and water from your body via urine. These medicines prevent the kidney from reabsorbing sodium. This increases urine flow. Diuretic treatment may also result in lowering of potassium levels in your body. Potassium supplements may be needed if you have to use diuretics daily. Daily weights can help you keep track of your progress in clearing your edema. You should call your caregiver for follow up care as recommended. SEEK IMMEDIATE MEDICAL CARE IF:   You have increased swelling, pain, redness, or heat in your legs.  You develop shortness of breath, especially when lying down.  You develop chest or abdominal pain, weakness, or fainting.  You have a fever.   This information is not intended to replace advice given to you by your health care provider. Make sure you discuss any questions you have with your health care provider.   Document Released: 10/02/2004 Document Revised: 11/17/2011 Document Reviewed: 03/07/2015 Elsevier Interactive Patient Education [employer]2016 [employer]

## 2015-08-25 NOTE — ED Provider Notes (Signed)
CSN: [id number]     Arrival date & time 08/25/15  0440 History   First MD Initiated Contact with Patient 08/25/15 0450     Chief Complaint  Patient presents with  . Hand Pain     (Consider location/radiation/quality/duration/timing/severity/associated sxs/prior Treatment) HPI Comments: Patient is a 34 y/o female with hx of TLH/BSO in 2013 secondary to strong family history of hypercalcemic small cell ovarian cancer. She presents to the emergency department for evaluation of bilateral hand swelling. She states that symptoms have been intermittent over the past week. Swelling has worsened more so over the past 48 hours. Patient reports burning paresthesias in her bilateral hands associated with her symptoms. She has been taking NSAIDs for pain and burning without relief. She feels as though her symptoms have improved slightly over the past few hours. She denies any lip or tongue swelling or fever. No trauma or injury to her hands. She denies any neck pain or extremity weakness. She has had no complete numbness in her upper extremities. No associated leg swelling. She denies frequent ingestion of salty/processed foods.  Patient is a 34 y.o. female presenting with hand pain. The history is provided by the patient. No language interpreter was used.  Hand Pain Associated symptoms include myalgias. Pertinent negatives include no fever, neck pain or weakness.    History reviewed. No pertinent past medical history. Past Surgical History  Procedure Laterality Date  . Appendectomy    . Abdominal hysterectomy    . Tonsillectomy     No family history on file. Social History  Substance Use Topics  . Smoking status: Current Every Day Smoker    Types: Cigarettes  . Smokeless tobacco: None  . Alcohol Use: Yes     Comment: occ   OB History    No data available      Review of Systems  Constitutional: Negative for fever.  Musculoskeletal: Positive for myalgias. Negative for back pain and neck  pain.       +hand swelling  Neurological: Negative for weakness.       +paresthesias "burning" b/l hands  All other systems reviewed and are negative.   Allergies  Latex  Home Medications   Prior to Admission medications   Medication Sig Start Date End Date Taking? Authorizing Provider  estradiol (ESTRACE) 2 MG tablet Take 2 mg by mouth 2 (two) times daily.     Historical Provider, MD  HYDROcodone-homatropine (HYCODAN) 5-1.5 MG/5ML syrup Take 5 mLs by mouth every 6 (six) hours as needed for cough. Patient not taking: Reported on 04/16/2015 04/12/15   Santiago GladHeather Laisure, PA-C  ibuprofen (ADVIL,MOTRIN) 200 MG tablet Take 400 mg by mouth daily as needed (pain).    Historical Provider, MD  naproxen (NAPROSYN) 500 MG tablet Take 1 tablet (500 mg total) by mouth 2 (two) times daily. 08/25/15   Antony MaduraKelly Myers [place], PA-C  OVER THE COUNTER MEDICATION Take 1 tablet by mouth daily with lunch. Fat fighter tablet by [employer]    EcologistHistorical Provider, MD  OVER THE COUNTER MEDICATION Take 1 each by mouth daily. [employer]-- mix with juice    Historical Provider, MD  sulfamethoxazole-trimethoprim (BACTRIM DS,SEPTRA DS) 800-160 MG per tablet Take 1 tablet by mouth 2 (two) times daily. 05/21/15   Henrietta HooverLinda C Bernhardt, NP  traMADol (ULTRAM) 50 MG tablet Once a day at bedtime. Patient not taking: Reported on 05/17/2015 04/16/15   Henrietta HooverLinda C Bernhardt, NP   BP 119/95 mmHg  Pulse 69  Temp(Src) 98.5  F (36.9 C) (Oral)  Resp 16  SpO2 100%   Physical Exam  Constitutional: She is oriented to person, place, and time. She appears well-developed and well-nourished. No distress.  Patient appears uncomfortable.  HENT:  Head: Normocephalic and atraumatic.  Eyes: Conjunctivae and EOM are normal. No scleral icterus.  Neck: Normal range of motion.  Cardiovascular: Normal rate, regular rhythm and intact distal pulses.   Distal radial pulse 2+ b/l  Pulmonary/Chest: Effort normal. No respiratory distress.  Respirations even and  unlabored  Musculoskeletal:  Mild soft tissue swelling to b/l hands without pitting edema. Decreased ROM with grips/when making fist secondary to swelling and "burning" pain. No erythema, heat to touch, or red linear streaking. No induration. No bony TTP or crepitus.  Neurological: She is alert and oriented to person, place, and time. She exhibits normal muscle tone. Coordination normal.  Sensation to light touch intact. Patient moving all extremities.  Skin: Skin is warm and dry. No rash noted. She is not diaphoretic. No erythema. No pallor.  Psychiatric: She has a normal mood and affect. Her behavior is normal.  Nursing note and vitals reviewed.   ED Course  Procedures (including critical care time) Labs Review Labs Reviewed  I-STAT CHEM 8, ED - Abnormal; Notable for the following:    Glucose, Bld 100 (*)    All other components within normal limits  URINALYSIS, ROUTINE W REFLEX MICROSCOPIC (NOT AT [facility])  URINE RAPID DRUG SCREEN, HOSP PERFORMED    Imaging Review No results found.   I have personally reviewed and evaluated these images and lab results as part of my medical decision-making.   EKG Interpretation None      MDM   Final diagnoses:  Bilateral hand swelling    Patient presenting for b/l hand swelling x 1 week. She is neurovascularly intact. Swelling is non-pitting and present distal to wrists only. No BLE edema. No respiratory symptoms. No concern for infection or cellulitis. Swelling is atraumatic in onset. Patient with UA pending at shift change to evaluate for proteinuria. If UA noncontributory, will refer to PCP for f/u. Patient signed out to E. Westfall, PA-C at shift change who will f/u on UA and disposition appropriately.   Filed Vitals:   08/25/15 0449  BP: 119/95  Pulse: 69  Temp: 98.5 F (36.9 C)  TempSrc: Oral  Resp: 16  SpO2: 100%       Antony Madura, PA-C 08/25/15 1610  Derwood Kaplan, MD 08/25/15 2211

## 2015-08-25 NOTE — ED Provider Notes (Signed)
Patient signed out to me at shift change by Antony MaduraKelly Humes, PA-C.  34 year old female presents with bilateral hand swelling x 1 week.  Awaiting UA to evaluate for proteinuria. Plan if noncontributory, patient to follow-up with PCP.  UA negative for proteinuria. Patient to follow-up with PCP for further evaluation and management of symptoms.  Patient requesting something for pain on discharge, advised to alternate taking tylenol and ibuprofen.  BP 110/80 mmHg  Pulse 74  Temp(Src) 98.5 F (36.9 C) (Oral)  Resp 18  SpO2 98%   Jordan Ibarra, [place] JerseyPA-C 08/25/15 [phone]

## 2015-08-25 NOTE — ED Notes (Signed)
Patient here with complaints of bilateral hand swelling for 1 week. Denies injury.

## 2015-09-06 ENCOUNTER — Emergency Department (HOSPITAL_COMMUNITY): Payer: Self-pay

## 2015-09-06 ENCOUNTER — Encounter (HOSPITAL_COMMUNITY): Payer: Self-pay | Admitting: Emergency Medicine

## 2015-09-06 ENCOUNTER — Emergency Department (HOSPITAL_COMMUNITY)
Admission: EM | Admit: 2015-09-06 | Discharge: 2015-09-07 | Disposition: A | Discharge status: Home / Self Care | Payer: Self-pay | Attending: Emergency Medicine | Admitting: Emergency Medicine

## 2015-09-06 DIAGNOSIS — Z91040 Latex allergy status: Secondary | ICD-10-CM | POA: Insufficient documentation

## 2015-09-06 DIAGNOSIS — R0789 Other chest pain: Secondary | ICD-10-CM | POA: Insufficient documentation

## 2015-09-06 DIAGNOSIS — J209 Acute bronchitis, unspecified: Secondary | ICD-10-CM | POA: Insufficient documentation

## 2015-09-06 DIAGNOSIS — Z79899 Other long term (current) drug therapy: Secondary | ICD-10-CM | POA: Insufficient documentation

## 2015-09-06 DIAGNOSIS — J40 Bronchitis, not specified as acute or chronic: Secondary | ICD-10-CM

## 2015-09-06 DIAGNOSIS — F17210 Nicotine dependence, cigarettes, uncomplicated: Secondary | ICD-10-CM | POA: Insufficient documentation

## 2015-09-06 LAB — I-STAT TROPONIN, ED: Troponin i, poc: 0 ng/mL (ref 0.00–0.08)

## 2015-09-06 LAB — BASIC METABOLIC PANEL
Anion gap: 11 (ref 5–15)
BUN: 20 mg/dL (ref 6–20)
CHLORIDE: 103 mmol/L (ref 101–111)
CO2: 29 mmol/L (ref 22–32)
Calcium: 9.6 mg/dL (ref 8.9–10.3)
Creatinine, Ser: 0.89 mg/dL (ref 0.44–1.00)
Glucose, Bld: 107 mg/dL — ABNORMAL HIGH (ref 65–99)
POTASSIUM: 4.5 mmol/L (ref 3.5–5.1)
SODIUM: 143 mmol/L (ref 135–145)

## 2015-09-06 LAB — CBC
HEMATOCRIT: 41.4 % (ref 36.0–46.0)
Hemoglobin: 14 g/dL (ref 12.0–15.0)
MCH: 29.6 pg (ref 26.0–34.0)
MCHC: 33.8 g/dL (ref 30.0–36.0)
MCV: 87.5 fL (ref 78.0–100.0)
PLATELETS: 275 10*3/uL (ref 150–400)
RBC: 4.73 MIL/uL (ref 3.87–5.11)
RDW: 13.1 % (ref 11.5–15.5)
WBC: 8 10*3/uL (ref 4.0–10.5)

## 2015-09-06 NOTE — ED Notes (Signed)
Pt. reports right chest pain radiating to right shoulder onset 4 days ago , mild SOB with occasional dry cough , pain increases with deep inspiration , denies nausea or diaphoresis .

## 2015-09-06 NOTE — ED Provider Notes (Signed)
CSN: [id number]     Arrival date & time 09/06/15  2138 History  By signing my name below, I, Budd Palmer, attest that this documentation has been prepared under the direction and in the presence of Celester Lech, MD. Electronically Signed: Budd Palmer, ED Scribe. 09/07/2015. 3:04 AM.    Chief Complaint  Patient presents with  . Chest Pain   Patient is a 34 y.o. female presenting with chest pain. The history is provided by the patient. No language interpreter was used.  Chest Pain Pain location:  R chest Pain quality: aching   Pain radiates to:  Does not radiate Pain radiates to the back: no   Pain severity:  Moderate Onset quality:  Gradual Duration:  4 days Timing:  Constant Progression:  Worsening Context: not lifting   Relieved by:  Nothing Worsened by:  Certain positions Ineffective treatments:  None tried Associated symptoms: cough and shortness of breath   Associated symptoms: no fever, no nausea and not vomiting   Risk factors: smoking    HPI Comments: Jordan Ibarra is a 34 y.o. female smoker at 4 cigarettes per day who presents to the Emergency Department complaining of constant, aching, worsening chest pain onset 4 days ago. Pt states the pain began below her right breast and has since spread upward across the entire right chest. She reports associated SOB, mild cough (onset this morning), and hand swelling. She notes exacerbation of the pain with movement and raising her arm, and alleviation of the pain with sitting still and lying supine. She notes no effects on the pain with eating. She states she has been taking Advil without relief. She reports her last BM was yesterday. She denies a PMHx of asthma and GERD. She also denies recent travel as well as any heavy lifting. Pt denies wheezing, leg swelling, n/v/d, and fever.    History reviewed. No pertinent past medical history. Past Surgical History  Procedure Laterality Date  . Appendectomy    . Abdominal  hysterectomy    . Tonsillectomy     History reviewed. No pertinent family history. Social History  Substance Use Topics  . Smoking status: Current Every Day Smoker    Types: Cigarettes  . Smokeless tobacco: None  . Alcohol Use: Yes     Comment: occ   OB History    No data available     Review of Systems  Constitutional: Negative for fever.  Respiratory: Positive for cough and shortness of breath. Negative for wheezing.   Cardiovascular: Positive for chest pain. Negative for leg swelling.  Gastrointestinal: Negative for nausea, vomiting and diarrhea.  All other systems reviewed and are negative.   Allergies  Latex  Home Medications   Prior to Admission medications   Medication Sig Start Date End Date Taking? Authorizing Provider  estradiol (ESTRACE) 2 MG tablet Take 2 mg by mouth 2 (two) times daily.    Yes Historical Provider, MD  ibuprofen (ADVIL,MOTRIN) 200 MG tablet Take 400 mg by mouth daily as needed (pain).   Yes Historical Provider, MD  naproxen (NAPROSYN) 500 MG tablet Take 1 tablet (500 mg total) by mouth 2 (two) times daily. 08/25/15  Yes Antony Madura, PA-C  HYDROcodone-homatropine [facility]) 5-1.5 MG/5ML syrup Take 5 mLs by mouth every 6 (six) hours as needed for cough. Patient not taking: Reported on 04/16/2015 04/12/15   Santiago Glad, PA-C  sulfamethoxazole-trimethoprim (BACTRIM DS,SEPTRA DS) 800-160 MG per tablet Take 1 tablet by mouth 2 (two) times daily. Patient not taking: Reported on  09/06/2015 05/21/15   Henrietta Hoover, NP  traMADol (ULTRAM) 50 MG tablet Once a day at bedtime. Patient not taking: Reported on 05/17/2015 04/16/15   Henrietta Hoover, NP   BP 117/77 mmHg  Pulse 74  Temp(Src) 98.7 F (37.1 C) (Oral)  Resp 18  SpO2 96% Physical Exam  Constitutional: She is oriented to person, place, and time. She appears well-developed and well-nourished.  HENT:  Head: Normocephalic and atraumatic.  Mouth/Throat: Oropharynx is clear and moist.  Eyes:  Conjunctivae and EOM are normal. Pupils are equal, round, and reactive to light. Right eye exhibits no discharge. Left eye exhibits no discharge.  Neck: Normal range of motion. Neck supple. No JVD present.  Cardiovascular: Normal rate, regular rhythm, normal heart sounds and intact distal pulses.  Exam reveals no gallop and no friction rub.   No murmur heard. 2+ DP  Pulmonary/Chest: Effort normal and breath sounds normal. No stridor. No respiratory distress. She has no wheezes. She has no rales. She exhibits no tenderness.  Abdominal: Soft. She exhibits no distension. There is no tenderness. There is no rebound and no guarding.  Hyperactive bowel sounds  Musculoskeletal: Normal range of motion. She exhibits no edema.  Soft compartments throughout  Lymphadenopathy:    She has no cervical adenopathy.  Neurological: She is alert and oriented to person, place, and time. Coordination normal.  Skin: Skin is warm and dry. No rash noted. She is not diaphoretic. No erythema.  Psychiatric: She has a normal mood and affect.  Nursing note and vitals reviewed.   ED Course  Procedures  DIAGNOSTIC STUDIES: Oxygen Saturation is 97% on RA, adequate by my interpretation.    COORDINATION OF CARE: 12:24 AM - Discussed normal lab and imaging results. Will order something to make pt more comfortable. Pt advised of plan for treatment and pt agrees.  3:03 AM- Discussed imaging results. Will prescribe a Z-pack. Pt advised of plan for treatment and pt agrees.   Labs Review Labs Reviewed  BASIC METABOLIC PANEL - Abnormal; Notable for the following:    Glucose, Bld 107 (*)    All other components within normal limits  CBC  I-STAT TROPOININ, ED    Imaging Review Dg Chest 2 View  09/06/2015  CLINICAL DATA:  34 year old female with chest pain EXAM: CHEST  2 VIEW COMPARISON:  Chest radiograph dated 09/06/2015 FINDINGS: The heart size and mediastinal contours are within normal limits. Both lungs are clear.  The visualized skeletal structures are unremarkable. IMPRESSION: No active cardiopulmonary disease. Electronically Signed   By: Elgie Collard M.D.   On: 09/06/2015 23:17   Ct Angio Chest Pe W/cm &/or Wo Cm  09/07/2015  CLINICAL DATA:  Acute onset of sharp right-sided chest pain and shortness of breath. Initial encounter. EXAM: CT ANGIOGRAPHY CHEST WITH CONTRAST TECHNIQUE: Multidetector CT imaging of the chest was performed using the standard protocol during bolus administration of intravenous contrast. Multiplanar CT image reconstructions and MIPs were obtained to evaluate the vascular anatomy. CONTRAST:  OMNIPAQUE IOHEXOL 350 MG/ML SOLN COMPARISON:  Chest radiograph performed 09/06/2015, and CTA of the chest performed 05/20/2009 FINDINGS: There is no evidence of pulmonary embolus. Mild right basilar opacity may reflect atelectasis or possibly mild pneumonia. There is elevation of the right hemidiaphragm. There is no evidence of pleural effusion or pneumothorax. No masses are identified; no abnormal focal contrast enhancement is seen. The mediastinum is grossly unremarkable in appearance. No mediastinal lymphadenopathy is seen. The great vessels are grossly unremarkable in appearance.  No pericardial effusion is identified. No axillary lymphadenopathy is seen. The thyroid gland is unremarkable in appearance. The visualized portions of the liver and spleen are unremarkable. The visualized portions of the pancreas, gallbladder, stomach, adrenal glands and right kidney are within normal limits. No acute osseous abnormalities are seen. Review of the MIP images confirms the above findings. IMPRESSION: 1. No evidence of pulmonary embolus. 2. Mild right basilar airspace opacity may reflect atelectasis or possibly mild pneumonia. Elevation of the right hemidiaphragm. Electronically Signed   By: Roanna RaiderJeffery  Chang M.D.   On: 09/07/2015 02:26   I have personally reviewed and evaluated these images and lab results as  part of my medical decision-making.   EKG Interpretation   Date/Time:  Thursday September 06 2015 21:47:47 EST Ventricular Rate:  74 PR Interval:  160 QRS Duration: 96 QT Interval:  400 QTC Calculation: 444 R Axis:   101 Text Interpretation:  Normal sinus rhythm Incomplete right bundle branch  block Possible Right ventricular hypertrophy Nonspecific T wave  abnormality Confirmed by [facility]  MD, Bailey Faiella ([id number]) on 09/06/2015  11:58:53 PM      MDM   Final diagnoses:  None    MSK pain based on the the positional in nature.  Ruled out for MI with negative EKG and troponin with > 8 hours of continuous pain,  No PE.  Will treat for pain.  Follow up with your PMD.  Strict return precautions given   I personally performed the services described in this documentation, which was scribed in my presence. The recorded information has been reviewed and is accurate.  '  Abed Schar, MD 09/07/15 [id number]

## 2015-09-07 ENCOUNTER — Emergency Department (HOSPITAL_COMMUNITY): Payer: No Typology Code available for payment source

## 2015-09-07 ENCOUNTER — Encounter (HOSPITAL_COMMUNITY): Payer: Self-pay | Admitting: Emergency Medicine

## 2015-09-07 MED ORDER — AZITHROMYCIN 250 MG PO TABS: 250.0000 mg | ORAL_TABLET | Freq: Every day | ORAL | Status: DC

## 2015-09-07 MED ORDER — MELOXICAM 7.5 MG PO TABS: 7.5000 mg | ORAL_TABLET | Freq: Every day | ORAL | Status: DC

## 2015-09-07 MED ORDER — GI COCKTAIL ~~LOC~~
30.0000 mL | Freq: Once | ORAL | Status: AC
  Administered 2015-09-07: 30 mL via ORAL
  Filled 2015-09-07: qty 30

## 2015-09-07 MED ORDER — IOHEXOL 350 MG/ML SOLN
100.0000 mL | Freq: Once | INTRAVENOUS | Status: AC | PRN
  Administered 2015-09-07: 100 mL via INTRAVENOUS

## 2015-09-07 MED ORDER — KETOROLAC TROMETHAMINE 30 MG/ML IJ SOLN
30.0000 mg | Freq: Once | INTRAMUSCULAR | Status: AC
  Administered 2015-09-07: 30 mg via INTRAVENOUS
  Filled 2015-09-07: qty 1

## 2015-09-07 NOTE — ED Notes (Signed)
Taken to CT at this time. 

## 2015-09-07 NOTE — ED Notes (Signed)
Back from CT

## 2015-09-17 ENCOUNTER — Emergency Department (HOSPITAL_COMMUNITY): Payer: No Typology Code available for payment source

## 2015-09-17 ENCOUNTER — Encounter (HOSPITAL_COMMUNITY): Payer: Self-pay

## 2015-09-17 ENCOUNTER — Emergency Department (HOSPITAL_COMMUNITY)
Admission: EM | Admit: 2015-09-17 | Discharge: 2015-09-17 | Disposition: A | Discharge status: Home / Self Care | Payer: No Typology Code available for payment source | Attending: Emergency Medicine | Admitting: Emergency Medicine

## 2015-09-17 ENCOUNTER — Ambulatory Visit: Payer: No Typology Code available for payment source | Admitting: Family Medicine

## 2015-09-17 DIAGNOSIS — J019 Acute sinusitis, unspecified: Secondary | ICD-10-CM | POA: Insufficient documentation

## 2015-09-17 DIAGNOSIS — Z791 Long term (current) use of non-steroidal anti-inflammatories (NSAID): Secondary | ICD-10-CM | POA: Insufficient documentation

## 2015-09-17 DIAGNOSIS — J209 Acute bronchitis, unspecified: Secondary | ICD-10-CM | POA: Insufficient documentation

## 2015-09-17 DIAGNOSIS — Z793 Long term (current) use of hormonal contraceptives: Secondary | ICD-10-CM | POA: Insufficient documentation

## 2015-09-17 DIAGNOSIS — Z91040 Latex allergy status: Secondary | ICD-10-CM | POA: Insufficient documentation

## 2015-09-17 DIAGNOSIS — F17210 Nicotine dependence, cigarettes, uncomplicated: Secondary | ICD-10-CM | POA: Insufficient documentation

## 2015-09-17 DIAGNOSIS — J40 Bronchitis, not specified as acute or chronic: Secondary | ICD-10-CM

## 2015-09-17 MED ORDER — ALBUTEROL SULFATE HFA 108 (90 BASE) MCG/ACT IN AERS
2.0000 | INHALATION_SPRAY | Freq: Once | RESPIRATORY_TRACT | Status: AC
  Administered 2015-09-17: 2 via RESPIRATORY_TRACT
  Filled 2015-09-17: qty 6.7

## 2015-09-17 MED ORDER — PREDNISONE 20 MG PO TABS: 40.0000 mg | ORAL_TABLET | Freq: Every day | ORAL | Status: DC

## 2015-09-17 MED ORDER — PREDNISONE 20 MG PO TABS
60.0000 mg | ORAL_TABLET | Freq: Once | ORAL | Status: AC
  Administered 2015-09-17: 60 mg via ORAL
  Filled 2015-09-17: qty 3

## 2015-09-17 MED ORDER — AMOXICILLIN-POT CLAVULANATE 875-125 MG PO TABS: 1.0000 | ORAL_TABLET | Freq: Two times a day (BID) | ORAL | Status: DC

## 2015-09-17 NOTE — ED Provider Notes (Signed)
CSN: [id number]     Arrival date & time 09/17/15  0836 History   First MD Initiated Contact with Patient 09/17/15 [phone]     Chief Complaint  Patient presents with  . Shortness of Breath  . Cough     (Consider location/radiation/quality/duration/timing/severity/associated sxs/prior Treatment) HPI Jordan Ibarra is a 35 y.o. female with no major medical problems, presents to ED with complaint of cough, shortness of breath. Pt states her symptoms started 10 days ago. She states it started with right sided chest pain, cough. Was evaluated in ED at that time, had negative trop, CBC, chem 8, Had a negative for PE CT angio which showed possible right lung atelectasis vs early pneumonia, was prescribed meloxicam and zpack. Pt states she finished all medications, however her symptoms are worsening. She reports nasal congestion, cough with productive green sputum. She reports SOB worse on exertion and when laying down flat. She reports chills at home, does not have thermometer so did not take temp. She is not taking any medications for her symptoms at this time. She is a smoker. Denies extremity pain or swelling. Denies n/v/d. No other complaints.   History reviewed. No pertinent past medical history. Past Surgical History  Procedure Laterality Date  . Appendectomy    . Abdominal hysterectomy    . Tonsillectomy     History reviewed. No pertinent family history. Social History  Substance Use Topics  . Smoking status: Current Every Day Smoker    Types: Cigarettes  . Smokeless tobacco: None  . Alcohol Use: Yes     Comment: occ   OB History    No data available     Review of Systems  Constitutional: Positive for chills.  HENT: Positive for congestion, postnasal drip and rhinorrhea. Negative for ear pain and sore throat.   Respiratory: Positive for cough, chest tightness and shortness of breath.   Cardiovascular: Positive for chest pain. Negative for palpitations and leg swelling.   Gastrointestinal: Negative for nausea, vomiting, abdominal pain and diarrhea.  Musculoskeletal: Positive for myalgias. Negative for arthralgias, neck pain and neck stiffness.  Skin: Negative for rash.  Neurological: Negative for dizziness, weakness and headaches.  All other systems reviewed and are negative.     Allergies  Latex  Home Medications   Prior to Admission medications   Medication Sig Start Date End Date Taking? Authorizing Provider  azithromycin (ZITHROMAX) 250 MG tablet Take 1 tablet (250 mg total) by mouth daily. Take first 2 tablets together, then 1 every day until finished. 09/07/15   April Palumbo, MD  estradiol (ESTRACE) 2 MG tablet Take 2 mg by mouth 2 (two) times daily.     Historical Provider, MD  HYDROcodone-homatropine (HYCODAN) 5-1.5 MG/5ML syrup Take 5 mLs by mouth every 6 (six) hours as needed for cough. Patient not taking: Reported on 04/16/2015 04/12/15   Santiago Glad, PA-C  ibuprofen (ADVIL,MOTRIN) 200 MG tablet Take 400 mg by mouth daily as needed (pain).    Historical Provider, MD  meloxicam (MOBIC) 7.5 MG tablet Take 1 tablet (7.5 mg total) by mouth daily. 09/07/15   April Palumbo, MD  naproxen (NAPROSYN) 500 MG tablet Take 1 tablet (500 mg total) by mouth 2 (two) times daily. 08/25/15   Antony Madura, PA-C  sulfamethoxazole-trimethoprim (BACTRIM DS,SEPTRA DS) 800-160 MG per tablet Take 1 tablet by mouth 2 (two) times daily. Patient not taking: Reported on 09/06/2015 05/21/15   Henrietta Hoover, NP  traMADol (ULTRAM) 50 MG tablet Once a day at bedtime. Patient  not taking: Reported on 05/17/2015 04/16/15   Henrietta HooverLinda C Bernhardt, NP   BP 117/88 mmHg  Pulse 90  Temp(Src) 97.6 F (36.4 C) (Oral)  Resp 24  SpO2 99% Physical Exam  Constitutional: She is oriented to person, place, and time. She appears well-developed and well-nourished. No distress.  HENT:  Head: Normocephalic.  Nose: Mucosal edema and rhinorrhea present. Right sinus exhibits maxillary sinus  tenderness. Left sinus exhibits maxillary sinus tenderness.  Mouth/Throat: Uvula is midline, oropharynx is clear and moist and mucous membranes are normal.  Eyes: Conjunctivae are normal.  Neck: Neck supple.  Cardiovascular: Normal rate, regular rhythm and normal heart sounds.   Pulmonary/Chest: Effort normal and breath sounds normal. No respiratory distress. She has no wheezes. She has no rales.  Abdominal: Soft. Bowel sounds are normal. She exhibits no distension. There is no tenderness. There is no rebound.  Musculoskeletal: She exhibits no edema.  Neurological: She is alert and oriented to person, place, and time.  Skin: Skin is warm and dry.  Psychiatric: She has a normal mood and affect. Her behavior is normal.  Nursing note and vitals reviewed.   ED Course  Procedures (including critical care time) Labs Review Labs Reviewed - No data to display  Imaging Review Dg Chest 2 View  09/17/2015  CLINICAL DATA:  Cough, congestion, and shortness of breath for 3 weeks, diagnosed with bronchitis on 09/06/2015, no improvement following Z-Pak, smoker EXAM: CHEST  2 VIEW COMPARISON:  09/06/2015 FINDINGS: Upper normal heart size. Mediastinal contours and pulmonary vascularity normal. Lungs clear. No pleural effusion or pneumothorax. Bones unremarkable. IMPRESSION: No acute abnormalities. Electronically Signed   By: Ulyses SouthwardMark  Boles M.D.   On: 09/17/2015 09:07   I have personally reviewed and evaluated these images and lab results as part of my medical decision-making.   EKG Interpretation None      MDM   Final diagnoses:  Acute sinusitis, recurrence not specified, unspecified location  Bronchitis    patient with worsening cough and shortness of breath, states coughing up green sputum, as well as having sinus pain and green sinus drainage. She is afebrile, normal vital signs here. Patient was seen for the same symptoms when they started, just 10 days ago, had negative CT angiogram except for  possible early pneumonia. Will do a chest x-ray. Patient is in no distress.  10:11 AM Chest x-ray is negative. Discussed smoking cessation. Will start on albuterol inhaler, prednisone for 5 days, will switch to Augmentin for sinus infection and bronchitis. Follow-up with primary care doctor. VS normal.   Filed Vitals:   09/17/15 0845  BP: 117/88  Pulse: 90  Temp: 97.6 F (36.4 C)  TempSrc: Oral  Resp: 24  SpO2: 99%     Jaynie Crumbleatyana Salimatou Simone, PA-C 09/17/15 1012  Cathren LaineKevin Steinl, MD 09/18/15 [phone]

## 2015-09-17 NOTE — ED Notes (Signed)
Patient transported to X-ray 

## 2015-09-17 NOTE — ED Notes (Signed)
Pt c/o increasing SOB, chest tightness w/ lying down, and cough x over 3 weeks.  Pain score 9/10.  Pt reports dark green mucous.  [facility] and [facility] w/ [facility].  Pt reports that she completed prescribed z-pack and has not taken anything OTC.

## 2015-09-17 NOTE — ED Notes (Signed)
Provider at bedside

## 2015-09-17 NOTE — Discharge Instructions (Signed)
Take augmentin as prescribed until all gone for sinus infection and bronchitis. Take inhaler 2 puffs every 4 hrs. Take prednisone as prescribed until all gone, next dose tomorrow. Follow up with your doctor for recheck in 3 days.   Acute Bronchitis Bronchitis is inflammation of the airways that extend from the windpipe into the lungs (bronchi). The inflammation often causes mucus to develop. This leads to a cough, which is the most common symptom of bronchitis.  In acute bronchitis, the condition usually develops suddenly and goes away over time, usually in a couple weeks. Smoking, allergies, and asthma can make bronchitis worse. Repeated episodes of bronchitis may cause further lung problems.  CAUSES Acute bronchitis is most often caused by the same virus that causes a cold. The virus can spread from person to person (contagious) through coughing, sneezing, and touching contaminated objects. SIGNS AND SYMPTOMS   Cough.   Fever.   Coughing up mucus.   Body aches.   Chest congestion.   Chills.   Shortness of breath.   Sore throat.  DIAGNOSIS  Acute bronchitis is usually diagnosed through a physical exam. Your health care provider will also ask you questions about your medical history. Tests, such as chest X-rays, are sometimes done to rule out other conditions.  TREATMENT  Acute bronchitis usually goes away in a couple weeks. Oftentimes, no medical treatment is necessary. Medicines are sometimes given for relief of fever or cough. Antibiotic medicines are usually not needed but may be prescribed in certain situations. In some cases, an inhaler may be recommended to help reduce shortness of breath and control the cough. A cool mist vaporizer may also be used to help thin bronchial secretions and make it easier to clear the chest.  HOME CARE INSTRUCTIONS  Get plenty of rest.   Drink enough fluids to keep your urine clear or pale yellow (unless you have a medical condition that  requires fluid restriction). Increasing fluids may help thin your respiratory secretions (sputum) and reduce chest congestion, and it will prevent dehydration.   Take medicines only as directed by your health care provider.  If you were prescribed an antibiotic medicine, finish it all even if you start to feel better.  Avoid smoking and secondhand smoke. Exposure to cigarette smoke or irritating chemicals will make bronchitis worse. If you are a smoker, consider using nicotine gum or skin patches to help control withdrawal symptoms. Quitting smoking will help your lungs heal faster.   Reduce the chances of another bout of acute bronchitis by washing your hands frequently, avoiding people with cold symptoms, and trying not to touch your hands to your mouth, nose, or eyes.   Keep all follow-up visits as directed by your health care provider.  SEEK MEDICAL CARE IF: Your symptoms do not improve after 1 week of treatment.  SEEK IMMEDIATE MEDICAL CARE IF:  You develop an increased fever or chills.   You have chest pain.   You have severe shortness of breath.  You have bloody sputum.   You develop dehydration.  You faint or repeatedly feel like you are going to pass out.  You develop repeated vomiting.  You develop a severe headache. MAKE SURE YOU:   Understand these instructions.  Will watch your condition.  Will get help right away if you are not doing well or get worse.   This information is not intended to replace advice given to you by your health care provider. Make sure you discuss any questions you have with  your health care provider.   Document Released: 10/02/2004 Document Revised: 09/15/2014 Document Reviewed: 02/15/2013 Elsevier Interactive Patient Education [employer].

## 2015-10-09 ENCOUNTER — Encounter (HOSPITAL_COMMUNITY): Payer: Self-pay | Admitting: Family Medicine

## 2015-10-09 ENCOUNTER — Emergency Department (HOSPITAL_COMMUNITY)
Admission: EM | Admit: 2015-10-09 | Discharge: 2015-10-09 | Disposition: A | Discharge status: Home / Self Care | Payer: No Typology Code available for payment source | Attending: Emergency Medicine | Admitting: Emergency Medicine

## 2015-10-09 DIAGNOSIS — X58XXXA Exposure to other specified factors, initial encounter: Secondary | ICD-10-CM | POA: Insufficient documentation

## 2015-10-09 DIAGNOSIS — Y998 Other external cause status: Secondary | ICD-10-CM | POA: Insufficient documentation

## 2015-10-09 DIAGNOSIS — M62838 Other muscle spasm: Secondary | ICD-10-CM

## 2015-10-09 DIAGNOSIS — F17210 Nicotine dependence, cigarettes, uncomplicated: Secondary | ICD-10-CM | POA: Insufficient documentation

## 2015-10-09 DIAGNOSIS — Y9289 Other specified places as the place of occurrence of the external cause: Secondary | ICD-10-CM | POA: Insufficient documentation

## 2015-10-09 DIAGNOSIS — Y9389 Activity, other specified: Secondary | ICD-10-CM | POA: Insufficient documentation

## 2015-10-09 DIAGNOSIS — M62830 Muscle spasm of back: Secondary | ICD-10-CM | POA: Insufficient documentation

## 2015-10-09 DIAGNOSIS — S161XXA Strain of muscle, fascia and tendon at neck level, initial encounter: Secondary | ICD-10-CM | POA: Insufficient documentation

## 2015-10-09 DIAGNOSIS — Z91040 Latex allergy status: Secondary | ICD-10-CM | POA: Insufficient documentation

## 2015-10-09 MED ORDER — METHOCARBAMOL 500 MG PO TABS
750.0000 mg | ORAL_TABLET | Freq: Once | ORAL | Status: AC
  Administered 2015-10-09: 750 mg via ORAL
  Filled 2015-10-09: qty 2

## 2015-10-09 MED ORDER — KETOROLAC TROMETHAMINE 60 MG/2ML IM SOLN
60.0000 mg | Freq: Once | INTRAMUSCULAR | Status: AC
  Administered 2015-10-09: 60 mg via INTRAMUSCULAR
  Filled 2015-10-09: qty 2

## 2015-10-09 MED ORDER — IBUPROFEN 600 MG PO TABS: 600.0000 mg | ORAL_TABLET | Freq: Three times a day (TID) | ORAL | Status: DC | PRN

## 2015-10-09 MED ORDER — METHOCARBAMOL 500 MG PO TABS: 500.0000 mg | ORAL_TABLET | Freq: Three times a day (TID) | ORAL | Status: DC | PRN

## 2015-10-09 NOTE — ED Provider Notes (Signed)
CSN: [id number]     Arrival date & time 10/09/15  0506 History   First MD Initiated Contact with Patient 10/09/15 0543     Chief Complaint  Patient presents with  . Back Pain  . Arm Pain      HPI Patient presents to the emergency department complaining of ongoing right-sided neck pain and right upper back pain over the past 7-10 days.  She's tried Flexeril and naproxen without improvement in her symptoms.  She does not have a primary care physician.  She denies injury or trauma.  She denies chest pain shortness of breath.  She denies abdominal pain.  No weakness of her arms or legs.  Pain is moderate in severity and worse with palpation and movement of her right lateral neck and right trapezius region.   History reviewed. No pertinent past medical history. Past Surgical History  Procedure Laterality Date  . Appendectomy    . Abdominal hysterectomy    . Tonsillectomy     History reviewed. No pertinent family history. Social History  Substance Use Topics  . Smoking status: Current Every Day Smoker    Types: Cigarettes  . Smokeless tobacco: None  . Alcohol Use: Yes     Comment: "Not that often"   OB History    No data available     Review of Systems  All other systems reviewed and are negative.     Allergies  Latex  Home Medications   Prior to Admission medications   Medication Sig Start Date End Date Taking? Authorizing Provider  ibuprofen (ADVIL,MOTRIN) 600 MG tablet Take 1 tablet (600 mg total) by mouth every 8 (eight) hours as needed. 10/09/15   Azalia Bilis, MD  methocarbamol (ROBAXIN) 500 MG tablet Take 1 tablet (500 mg total) by mouth every 8 (eight) hours as needed for muscle spasms. 10/09/15   Azalia Bilis, MD   BP 114/82 mmHg  Pulse 83  Temp(Src) 97.5 F (36.4 C) (Oral)  Resp 18  Ht  (1.651 m)  Wt 180 lb (81.647 kg)  BMI 29.95 kg/m2  SpO2 99% Physical Exam  Constitutional: She is oriented to person, place, and time. She appears well-developed and  well-nourished. No distress.  HENT:  Head: Normocephalic and atraumatic.  Eyes: EOM are normal.  Neck: Neck supple.  C-spine nontender.  Right lateral paracervical tenderness with mild spasm  Cardiovascular: Normal rate, regular rhythm and normal heart sounds.   Pulmonary/Chest: Effort normal and breath sounds normal.  Abdominal: Soft. She exhibits no distension. There is no tenderness.  Musculoskeletal: Normal range of motion.  No thoracic vertebral tenderness.  Parathoracic and right trapezius tenderness with right trapezius spasm.  Normal strength in arms.  Normal right radial pulse  Neurological: She is alert and oriented to person, place, and time.  Skin: Skin is warm and dry.  Psychiatric: She has a normal mood and affect. Judgment normal.  Nursing note and vitals reviewed.   ED Course  Procedures (including critical care time) Labs Review Labs Reviewed - No data to display  Imaging Review No results found. I have personally reviewed and evaluated these images and lab results as part of my medical decision-making.   EKG Interpretation None      MDM   Final diagnoses:  Muscle spasm  Cervical strain, acute, initial encounter    Overall well-appearing.  Normal strength in arms and legs.  No indication for advanced imaging.  Discharge home in good condition.    Azalia Bilis, MD 10/09/15  0601 

## 2015-10-09 NOTE — Discharge Instructions (Signed)
Heat Therapy °Heat therapy can help ease sore, stiff, injured, and tight muscles and joints. Heat relaxes your muscles, which may help ease your pain.  °RISKS AND COMPLICATIONS °If you have any of the following conditions, do not use heat therapy unless your health care provider has approved: °· Poor circulation. °· Healing wounds or scarred skin in the area being treated. °· Diabetes, heart disease, or high blood pressure. °· Not being able to feel (numbness) the area being treated. °· Unusual swelling of the area being treated. °· Active infections. °· Blood clots. °· Cancer. °· Inability to communicate pain. This may include young children and people who have problems with their brain function (dementia). °· Pregnancy. °Heat therapy should only be used on old, pre-existing, or long-lasting (chronic) injuries. Do not use heat therapy on new injuries unless directed by your health care provider. °HOW TO USE HEAT THERAPY °There are several different kinds of heat therapy, including: °· Moist heat pack. °· Warm water bath. °· Hot water bottle. °· Electric heating pad. °· Heated gel pack. °· Heated wrap. °· Electric heating pad. °Use the heat therapy method suggested by your health care provider. Follow your health care provider's instructions on when and how to use heat therapy. °GENERAL HEAT THERAPY RECOMMENDATIONS °· Do not sleep while using heat therapy. Only use heat therapy while you are awake. °· Your skin may turn pink while using heat therapy. Do not use heat therapy if your skin turns red. °· Do not use heat therapy if you have new pain. °· High heat or long exposure to heat can cause burns. Be careful when using heat therapy to avoid burning your skin. °· Do not use heat therapy on areas of your skin that are already irritated, such as with a rash or sunburn. °SEEK MEDICAL CARE IF: °· You have blisters, redness, swelling, or numbness. °· You have new pain. °· Your pain is worse. °MAKE SURE  YOU: °· Understand these instructions. °· Will watch your condition. °· Will get help right away if you are not doing well or get worse. °  °This information is not intended to replace advice given to you by your health care provider. Make sure you discuss any questions you have with your health care provider. °  °Document Released: 11/17/2011 Document Revised: 09/15/2014 Document Reviewed: 10/18/2013 °Elsevier Interactive Patient Education ©2016 Elsevier Inc. ° °Muscle Cramps and Spasms °Muscle cramps and spasms occur when a muscle or muscles tighten and you have no control over this tightening (involuntary muscle contraction). They are a common problem and can develop in any muscle. The most common place is in the calf muscles of the leg. Both muscle cramps and muscle spasms are involuntary muscle contractions, but they also have differences:  °· Muscle cramps are sporadic and painful. They may last a few seconds to a quarter of an hour. Muscle cramps are often more forceful and last longer than muscle spasms. °· Muscle spasms may or may not be painful. They may also last just a few seconds or much longer. °CAUSES  °It is uncommon for cramps or spasms to be due to a serious underlying problem. In many cases, the cause of cramps or spasms is unknown. Some common causes are:  °· Overexertion.   °· Overuse from repetitive motions (doing the same thing over and over).   °· Remaining in a certain position for a long period of time.   °· Improper preparation, form, or technique while performing a sport or activity.   °·   Dehydration.   °· Injury.   °· Side effects of some medicines.   °· Abnormally low levels of the salts and ions in your blood (electrolytes), especially potassium and calcium. This could happen if you are taking water pills (diuretics) or you are pregnant.   °Some underlying medical problems can make it more likely to develop cramps or spasms. These include, but are not limited to:  °· Diabetes.    °· Parkinson disease.   °· Hormone disorders, such as thyroid problems.   °· Alcohol abuse.   °· Diseases specific to muscles, joints, and bones.   °· Blood vessel disease where not enough blood is getting to the muscles.   °HOME CARE INSTRUCTIONS  °· Stay well hydrated. Drink enough water and fluids to keep your urine clear or pale yellow. °· It may be helpful to massage, stretch, and relax the affected muscle. °· For tight or tense muscles, use a warm towel, heating pad, or hot shower water directed to the affected area. °· If you are sore or have pain after a cramp or spasm, applying ice to the affected area may relieve discomfort. °¨ Put ice in a plastic bag. °¨ Place a towel between your skin and the bag. °¨ Leave the ice on for 15-20 minutes, 03-04 times a day. °· Medicines used to treat a known cause of cramps or spasms may help reduce their frequency or severity. Only take over-the-counter or prescription medicines as directed by your caregiver. °SEEK MEDICAL CARE IF:  °Your cramps or spasms get more severe, more frequent, or do not improve over time.  °MAKE SURE YOU:  °· Understand these instructions. °· Will watch your condition. °· Will get help right away if you are not doing well or get worse. °  °This information is not intended to replace advice given to you by your health care provider. Make sure you discuss any questions you have with your health care provider. °  °Document Released: 02/14/2002 Document Revised: 12/20/2012 Document Reviewed: 08/11/2012 °Elsevier Interactive Patient Education ©2016 Elsevier Inc. ° °

## 2015-10-09 NOTE — ED Notes (Signed)
Pt reports she is experiencing neck pain to her mid back and right arm pain. Pt states she dx with bronchitis at [facility], dx with PNA at [facility], and dx with muscle spasms by [facility]. All dx since mid December when symptoms started. Took FLEXERIL  around midnight.

## 2015-10-09 NOTE — ED Notes (Signed)
Patient is alert and oriented x3.  She was given DC instructions and follow up visit instructions.  Patient gave verbal understanding. She was DC ambulatory under her own power to home.  V/S stable.  He was not showing any signs of distress on DC 

## 2015-11-15 ENCOUNTER — Other Ambulatory Visit: Payer: Self-pay | Admitting: Family Medicine

## 2015-11-15 ENCOUNTER — Other Ambulatory Visit: Payer: Self-pay

## 2015-11-15 ENCOUNTER — Ambulatory Visit (INDEPENDENT_AMBULATORY_CARE_PROVIDER_SITE_OTHER): Payer: Self-pay | Admitting: Family Medicine

## 2015-11-15 ENCOUNTER — Encounter: Payer: Self-pay | Admitting: Family Medicine

## 2015-11-15 VITALS — BP 123/69 | HR 82 | Temp 97.7°F | Ht 65.0 in | Wt 195.0 lb

## 2015-11-15 DIAGNOSIS — Z1231 Encounter for screening mammogram for malignant neoplasm of breast: Secondary | ICD-10-CM

## 2015-11-15 DIAGNOSIS — J189 Pneumonia, unspecified organism: Secondary | ICD-10-CM

## 2015-11-15 DIAGNOSIS — M79642 Pain in left hand: Secondary | ICD-10-CM

## 2015-11-15 DIAGNOSIS — Z1239 Encounter for other screening for malignant neoplasm of breast: Secondary | ICD-10-CM

## 2015-11-15 DIAGNOSIS — M79641 Pain in right hand: Secondary | ICD-10-CM

## 2015-11-15 NOTE — Patient Instructions (Signed)
[facility] in about 5 weeks to get a repeat chest x-ray to make sure pneumonia has cleared. Follow-up in ED or Urgent Care if respiratory status worsens. We will let you know about your arthritis panel results. Follow-up in 6 months and as needed.

## 2015-11-15 NOTE — Progress Notes (Signed)
Patient ID: Jordan Ibarra, female   DOB: December 22, 1980, 35 y.o.   MRN: [id number]   Jordan Ibarra, is a 35 y.o. female  [id number]  [id number]  DOB - December 22, 1980  CC:  Chief Complaint  Patient presents with  . Weight Check  . Nicotine Dependence       HPI: Jordan Ibarra is a 35 y.o. female here for routine follow-up and to follow-up on CAP. [facility] and treated with Zithromax. She reports much improvement but still experiencing some shorthness of breath with exertion. Her other complaint is of wrist and hand pain and swelling. This has been bothering her since at least December. She has a family history of RA in two aunts. She reports never being screened for same. Otherwise she reports being well. She had decreased her cigarette use to 1 a day prior to the pneumonia and has had no cigarettes in about a week. She reports rare alcohol use and no illicit drug use. She reports being told in 2009 that she had a heart murmur.  Health Maintenance: She is up to date on Tdap and influenza. She is in need of a mammogram but has [facility].   Allergies  Allergen Reactions  . Latex Rash   History reviewed. No pertinent past medical history. Current Outpatient Prescriptions on File Prior to Visit  Medication Sig Dispense Refill  . ibuprofen (ADVIL,MOTRIN) 600 MG tablet Take 1 tablet (600 mg total) by mouth every 8 (eight) hours as needed. 15 tablet 0  . methocarbamol (ROBAXIN) 500 MG tablet Take 1 tablet (500 mg total) by mouth every 8 (eight) hours as needed for muscle spasms. 20 tablet 0   No current facility-administered medications on file prior to visit.   History reviewed. No pertinent family history. Social History   Social History  . Marital Status: Married    Spouse Name: N/A  . Number of Children: N/A  . Years of Education: N/A   Occupational History  . Not on file.   Social History Main Topics  . Smoking status:  Current Every Day Smoker    Types: Cigarettes  . Smokeless tobacco: Not on file  . Alcohol Use: Yes     Comment: "Not that often"  . Drug Use: No  . Sexual Activity: Not on file   Other Topics Concern  . Not on file   Social History Narrative    Review of Systems: Constitutional: Negative for fever, chills, weight loss. Has been fatigued and experienced some decrease in appetite related to CAP. Skin: Negative for rashes or lesions of concern. HENT: Negative for ear pain, ear discharge.nose bleeds Eyes: Negative for pain, discharge, redness, itching and visual disturbance. Neck: Negative for pain, stiffness Respiratory: Positive for cough, shortness of breath recently due to CAP  Cardiovascular: Negative for chest pain, palpitations and leg swelling. Gastrointestinal: Negative for abdominal pain, nausea, vomiting, diarrhea, constipations Genitourinary: Negative for dysuria, urgency, frequency, hematuria,  Musculoskeletal: Positive for pain and swelling in wrist and hands bilaterally. Denies other joint pain or swelling Neurological: Negative for dizziness, tremors, seizures, syncope,   light-headedness, numbness and headaches.  Hematological: Negative for easy bruising or bleeding Psychiatric/Behavioral: Negative for depression, anxiety, decreased concentration, confusion   Objective:   Filed Vitals:   11/15/15 0839  BP: 123/69  Pulse: 82  Temp: 97.7 F (36.5 C)    Physical Exam: Constitutional: Patient appears well-developed and well-nourished. No distress. HENT: Normocephalic, atraumatic,  External right and left ear normal. Oropharynx is clear and moist.  Eyes: Conjunctivae and EOM are normal. PERRLA, no scleral icterus. Neck: Normal ROM. Neck supple. No lymphadenopathy, No thyromegaly. CVS: RRR, S1/S2, no gallops, no rubs. Systolic murmur heard Pulmonary: Effort and breath sounds normal, no stridor, rhonchi, wheezes, rales.  Abdominal: Soft. Normoactive BS,, no  distension, tenderness, rebound or guarding.  Musculoskeletal: Hands and fingers are swollen and grip is weaker than expected. Neuro: Alert.Normal muscle tone coordination. Non-focal Skin: Skin is warm and dry. No rash noted. Not diaphoretic. No erythema. No pallor. Psychiatric: Normal mood and affect. Behavior, judgment, thought content normal.  Lab Results  Component Value Date   WBC 8.0 09/06/2015   HGB 14.0 09/06/2015   HCT 41.4 09/06/2015   MCV 87.5 09/06/2015   PLT 275 09/06/2015   Lab Results  Component Value Date   CREATININE 0.89 09/06/2015   BUN 20 09/06/2015   NA 143 09/06/2015   K 4.5 09/06/2015   CL 103 09/06/2015   CO2 29 09/06/2015    Lab Results  Component Value Date   HGBA1C  05/19/2009    5.4 (NOTE) The ADA recommends the following therapeutic goal for glycemic control related to Hgb A1c measurement: Goal of therapy: <6.5 Hgb A1c  Reference: American Diabetes Association: Clinical Practice Recommendations 2010, Diabetes Care, 2010, 33: (Suppl  1).   Lipid Panel     Component Value Date/Time   CHOL 197 05/17/2015 0931   TRIG 219* 05/17/2015 0931   HDL 38* 05/17/2015 0931   CHOLHDL 5.2* 05/17/2015 0931   VLDL 44* 05/17/2015 0931   LDLCALC 115 05/17/2015 0931       Assessment and plan:   1. Bilateral hand pain  - Arthritis Panel  2. CAP, treated and improving -Follow-up Chest x-ray in 5 weeks.    Return in about 6 months (around 05/17/2016). and as needed.  The patient was given clear instructions to go to ER or return to medical center if symptoms don't improve, worsen or new problems develop. The patient verbalized understanding.    Henrietta Hoover FNP  11/15/2015, 9:20 AM

## 2015-11-17 LAB — URIC ACID: Uric Acid, Serum: 4.6 mg/dL (ref 2.4–7.0)

## 2015-11-17 LAB — RHEUMATOID FACTOR

## 2015-11-19 LAB — ANA: Anti Nuclear Antibody(ANA): NEGATIVE

## 2015-11-29 ENCOUNTER — Ambulatory Visit: Payer: Self-pay

## 2015-12-09 ENCOUNTER — Encounter (HOSPITAL_COMMUNITY): Payer: Self-pay

## 2015-12-09 ENCOUNTER — Emergency Department (HOSPITAL_COMMUNITY)
Admission: EM | Admit: 2015-12-09 | Discharge: 2015-12-09 | Disposition: A | Discharge status: Home / Self Care | Payer: Medicaid Other | Attending: Emergency Medicine | Admitting: Emergency Medicine

## 2015-12-09 DIAGNOSIS — M25511 Pain in right shoulder: Secondary | ICD-10-CM | POA: Diagnosis present

## 2015-12-09 DIAGNOSIS — Z91040 Latex allergy status: Secondary | ICD-10-CM | POA: Diagnosis not present

## 2015-12-09 DIAGNOSIS — M7989 Other specified soft tissue disorders: Secondary | ICD-10-CM | POA: Diagnosis not present

## 2015-12-09 DIAGNOSIS — F17210 Nicotine dependence, cigarettes, uncomplicated: Secondary | ICD-10-CM | POA: Insufficient documentation

## 2015-12-09 DIAGNOSIS — M5410 Radiculopathy, site unspecified: Secondary | ICD-10-CM | POA: Insufficient documentation

## 2015-12-09 DIAGNOSIS — Z793 Long term (current) use of hormonal contraceptives: Secondary | ICD-10-CM | POA: Diagnosis not present

## 2015-12-09 MED ORDER — CYCLOBENZAPRINE HCL 10 MG PO TABS: 10.0000 mg | ORAL_TABLET | Freq: Two times a day (BID) | ORAL | Status: DC | PRN

## 2015-12-09 NOTE — Discharge Instructions (Signed)
Please contact neurosurgeon and schedule follow-up evaluation for further management. If any new or worsening signs or symptoms present please return to the emergency room.

## 2015-12-09 NOTE — ED Notes (Signed)
Pt reports right shoulder pain radiating to neck, across upper back and down her left arm and reports fingers to both hands have been going numb; this has been going on for about four months. She has [facility] and was told it was a pulled muscle. Pain worse with movement.

## 2015-12-09 NOTE — ED Provider Notes (Signed)
CSN: [id number]     Arrival date & time 12/09/15  1845 History  By signing my name below, I, Evon Slackerrance Branch, attest that this documentation has been prepared under the direction and in the presence of Newell RubbermaidJeffrey Laci Frenkel, PA-C. Electronically Signed: Evon Slackerrance Branch, ED Scribe. 12/09/2015. 9:13 PM.    Chief Complaint  Patient presents with  . Shoulder Pain   The history is provided by the patient. No language interpreter was used.   HPI Comments: Jordan Ibarra is a 35 y.o. female who presents to the Emergency Department complaining of right shoulder pain radiating down her right arm onset 4 months prior. Pt also reports bilateral hand swelling and paresthesias. She states that the pain initially began in her neck 4 months prior. Pt states that she has been evaluated for the the same pain previously and was diagnosed with a torn muscle. Pt states she has tried ibuprofen with no relief. Pt states that she has tried massage therapy and warm compress with no relief.    History reviewed. No pertinent past medical history. Past Surgical History  Procedure Laterality Date  . Appendectomy    . Abdominal hysterectomy    . Tonsillectomy     No family history on file. Social History  Substance Use Topics  . Smoking status: Current Every Day Smoker    Types: Cigarettes  . Smokeless tobacco: None  . Alcohol Use: Yes     Comment: "Not that often"   OB History    No data available      Review of Systems  All other systems reviewed and are negative.    Allergies  Latex  Home Medications   Prior to Admission medications   Medication Sig Start Date End Date Taking? Authorizing Provider  azithromycin (ZITHROMAX) 250 MG tablet  11/10/15   Historical Provider, MD  cyclobenzaprine (FLEXERIL) 10 MG tablet Take 1 tablet (10 mg total) by mouth 2 (two) times daily as needed for muscle spasms. 12/09/15   Eyvonne MechanicJeffrey Dymphna Wadley, PA-C  estradiol (ESTRING) 2 MG vaginal ring Place 2 mg vaginally.    Historical  Provider, MD  HYDROcodone-acetaminophen (NORCO/VICODIN) 5-325 MG tablet TK 1 T PO Q 8 H PRN P 11/10/15   Historical Provider, MD  ibuprofen (ADVIL,MOTRIN) 600 MG tablet Take 1 tablet (600 mg total) by mouth every 8 (eight) hours as needed. 10/09/15   Azalia BilisKevin Campos, MD  methocarbamol (ROBAXIN) 500 MG tablet Take 1 tablet (500 mg total) by mouth every 8 (eight) hours as needed for muscle spasms. 10/09/15   Azalia BilisKevin Campos, MD   BP 120/75 mmHg  Pulse 85  Temp(Src) 98.4 F (36.9 C) (Oral)  Resp 16  SpO2 96%   Physical Exam  Constitutional: She is oriented to person, place, and time. She appears well-developed and well-nourished. No distress.  HENT:  Head: Normocephalic and atraumatic.  Eyes: Conjunctivae and EOM are normal.  Neck: Neck supple. No tracheal deviation present.  Cardiovascular: Normal rate.   Pulmonary/Chest: Effort normal. No respiratory distress.  Musculoskeletal: Normal range of motion.  External inspection of the neck and shoulder show no signs of asymmetry or deformity. She has no signs of infection, mass, significant tenderness to palpation of the neck. Patient has right trapezius tenderness to palpation, and tense trapezius muscle. She has full active range of motion of the lower extremity, 5 out of 5 strength, radial pulses are 2+, Refill less than 3 seconds. No pain with range of motion of the neck or back.  Neurological: She is alert  and oriented to person, place, and time.  Skin: Skin is warm and dry.  Psychiatric: She has a normal mood and affect. Her behavior is normal.  Nursing note and vitals reviewed.   ED Course  Procedures (including critical care time) DIAGNOSTIC STUDIES: Oxygen Saturation is 96% on RA, normal by my interpretation.    COORDINATION OF CARE: 9:12 PM-Discussed treatment plan with pt at bedside and pt agreed to plan.     Labs Review Labs Reviewed - No data to display  Imaging Review No results found.    EKG Interpretation None       MDM   Final diagnoses:  Radicular pain  Labs:  Imaging:  Consults:  Therapeutics:  Discharge Meds: Flexeril   Assessment/Plan:Patient presents with likely radicular pain. Patient has significant tenderness to the palpation of the trapezius, this is likely impingement due to a muscular source, although I cannot rule out cervical abnormality causing this. I informed patient that she would need to follow-up with neurosurgery for further evaluation of this ongoing complaint. Patient reports she seen orthopedic specialist who reports this is muscular in nature. I see no signs or symptoms on patient's exam today that would necessitate immediate MRI evaluation of this patient, she is safe for discharge and follow-up. She is given strict return precautions, she verbalized understanding and agreement today's plan.    I personally performed the services described in this documentation, which was scribed in my presence. The recorded information has been reviewed and is accurate.     Eyvonne Mechanic, PA-C 12/09/15 2240  Margarita Grizzle, MD 12/10/15 [phone]

## 2015-12-20 ENCOUNTER — Other Ambulatory Visit (HOSPITAL_COMMUNITY): Payer: Self-pay | Admitting: Neurological Surgery

## 2015-12-20 DIAGNOSIS — M5412 Radiculopathy, cervical region: Secondary | ICD-10-CM

## 2015-12-27 ENCOUNTER — Ambulatory Visit (HOSPITAL_COMMUNITY)
Admission: RE | Admit: 2015-12-27 | Discharge: 2015-12-27 | Disposition: A | Discharge status: Home / Self Care | Payer: Medicaid Other | Source: Ambulatory Visit | Attending: Neurological Surgery | Admitting: Neurological Surgery

## 2015-12-27 DIAGNOSIS — M5412 Radiculopathy, cervical region: Secondary | ICD-10-CM | POA: Insufficient documentation

## 2015-12-27 DIAGNOSIS — M50223 Other cervical disc displacement at C6-C7 level: Secondary | ICD-10-CM | POA: Insufficient documentation

## 2015-12-27 DIAGNOSIS — M50123 Cervical disc disorder at C6-C7 level with radiculopathy: Secondary | ICD-10-CM | POA: Insufficient documentation

## 2016-01-16 ENCOUNTER — Other Ambulatory Visit: Payer: Self-pay | Admitting: Neurological Surgery

## 2016-01-18 ENCOUNTER — Encounter (HOSPITAL_COMMUNITY): Payer: Self-pay

## 2016-01-18 ENCOUNTER — Encounter (HOSPITAL_COMMUNITY)
Admission: RE | Admit: 2016-01-18 | Discharge: 2016-01-18 | Disposition: A | Discharge status: Home / Self Care | Payer: Medicaid Other | Source: Ambulatory Visit | Attending: Neurological Surgery | Admitting: Neurological Surgery

## 2016-01-18 DIAGNOSIS — M5020 Other cervical disc displacement, unspecified cervical region: Secondary | ICD-10-CM | POA: Insufficient documentation

## 2016-01-18 DIAGNOSIS — Z01812 Encounter for preprocedural laboratory examination: Secondary | ICD-10-CM | POA: Insufficient documentation

## 2016-01-18 HISTORY — DX: Gastro-esophageal reflux disease without esophagitis: K21.9

## 2016-01-18 LAB — SURGICAL PCR SCREEN
MRSA, PCR: POSITIVE — AB
STAPHYLOCOCCUS AUREUS: POSITIVE — AB

## 2016-01-18 LAB — CBC
HEMATOCRIT: 41.5 % (ref 36.0–46.0)
HEMOGLOBIN: 13.9 g/dL (ref 12.0–15.0)
MCH: 28.8 pg (ref 26.0–34.0)
MCHC: 33.5 g/dL (ref 30.0–36.0)
MCV: 86.1 fL (ref 78.0–100.0)
Platelets: 266 10*3/uL (ref 150–400)
RBC: 4.82 MIL/uL (ref 3.87–5.11)
RDW: 12.7 % (ref 11.5–15.5)
WBC: 7.7 10*3/uL (ref 4.0–10.5)

## 2016-01-18 NOTE — Pre-Procedure Instructions (Signed)
    Jordan DomRebecca M Ibarra  01/18/2016      [facility] - [place], [place] - [address] [place] [id number] Phone: [phone] Fax: [phone]    Your procedure is scheduled on Jan 22, 2016   Tuesday   [facility].M.   Call this number if you have problems the morning of surgery:  [phone]   Remember:  Do not eat food or drink liquids after midnight.   Take these medicines the morning of surgery with A SIP OF WATER methocarbamol (Robaxin) if needed              Stop all Aspirin,Ibuprofen,advil,motrin,Goody,s powders,herbal supplements,mobic,vitamins 5 days prior to surgery       Do not wear jewelry, make-up or nail polish.  Do not wear lotions, powders, or perfumes.  You may NOT wear deodorant.  Do not shave 48 hours prior to surgery.  ]   Do not bring valuables to the hospital.  [facility] is not responsible for any belongings or valuables.  Contacts, dentures or bridgework may not be worn into surgery.  Leave your suitcase in the car.  After surgery it may be brought to your room.  For patients admitted to the hospital, discharge time will be determined by your treatment team.  Patients discharged the day of surgery will not be allowed to drive home.    Special instructions:  See attached Sheet for instructions on CHG showers

## 2016-01-18 NOTE — Progress Notes (Signed)
Pt. Notified of results of nasal swab,Rx for mupirocin called to [facility] on [address].

## 2016-01-22 ENCOUNTER — Encounter (HOSPITAL_COMMUNITY): Payer: Self-pay | Admitting: *Deleted

## 2016-01-22 ENCOUNTER — Observation Stay (HOSPITAL_COMMUNITY)
Admission: RE | Admit: 2016-01-22 | Discharge: 2016-01-22 | Disposition: A | Discharge status: Home / Self Care | Payer: Medicaid Other | Source: Ambulatory Visit | Attending: Neurological Surgery | Admitting: Neurological Surgery

## 2016-01-22 ENCOUNTER — Encounter (HOSPITAL_COMMUNITY)
Admission: RE | Disposition: A | Discharge status: Home / Self Care | Payer: Self-pay | Source: Ambulatory Visit | Attending: Neurological Surgery

## 2016-01-22 ENCOUNTER — Ambulatory Visit (HOSPITAL_COMMUNITY): Payer: Medicaid Other | Admitting: Certified Registered"

## 2016-01-22 ENCOUNTER — Ambulatory Visit (HOSPITAL_COMMUNITY): Payer: Medicaid Other

## 2016-01-22 DIAGNOSIS — Z6830 Body mass index (BMI) 30.0-30.9, adult: Secondary | ICD-10-CM | POA: Diagnosis not present

## 2016-01-22 DIAGNOSIS — M50123 Cervical disc disorder at C6-C7 level with radiculopathy: Secondary | ICD-10-CM | POA: Diagnosis present

## 2016-01-22 DIAGNOSIS — K219 Gastro-esophageal reflux disease without esophagitis: Secondary | ICD-10-CM | POA: Diagnosis not present

## 2016-01-22 DIAGNOSIS — M542 Cervicalgia: Secondary | ICD-10-CM

## 2016-01-22 DIAGNOSIS — Z791 Long term (current) use of non-steroidal anti-inflammatories (NSAID): Secondary | ICD-10-CM | POA: Insufficient documentation

## 2016-01-22 DIAGNOSIS — F17210 Nicotine dependence, cigarettes, uncomplicated: Secondary | ICD-10-CM | POA: Insufficient documentation

## 2016-01-22 DIAGNOSIS — E669 Obesity, unspecified: Secondary | ICD-10-CM | POA: Insufficient documentation

## 2016-01-22 DIAGNOSIS — M5412 Radiculopathy, cervical region: Secondary | ICD-10-CM | POA: Diagnosis present

## 2016-01-22 HISTORY — PX: ANTERIOR CERVICAL DECOMP/DISCECTOMY FUSION: SHX1161

## 2016-01-22 SURGERY — ANTERIOR CERVICAL DECOMPRESSION/DISCECTOMY FUSION 1 LEVEL
Anesthesia: General

## 2016-01-22 MED ORDER — ROCURONIUM BROMIDE 100 MG/10ML IV SOLN: INTRAVENOUS | Status: DC | PRN

## 2016-01-22 MED ORDER — THROMBIN 5000 UNITS EX SOLR
CUTANEOUS | Status: DC | PRN
  Administered 2016-01-22 (×2): 5000 [IU] via TOPICAL

## 2016-01-22 MED ORDER — ONDANSETRON HCL 4 MG/2ML IJ SOLN
INTRAMUSCULAR | Status: DC | PRN
  Administered 2016-01-22: 4 mg via INTRAVENOUS

## 2016-01-22 MED ORDER — SUCCINYLCHOLINE CHLORIDE 200 MG/10ML IV SOSY
PREFILLED_SYRINGE | INTRAVENOUS | Status: AC
  Filled 2016-01-22: qty 10

## 2016-01-22 MED ORDER — BUPIVACAINE-EPINEPHRINE (PF) 0.5% -1:200000 IJ SOLN
INTRAMUSCULAR | Status: DC | PRN
  Administered 2016-01-22: 10 mL via PERINEURAL

## 2016-01-22 MED ORDER — HYDROMORPHONE HCL 1 MG/ML IJ SOLN
0.2500 mg | INTRAMUSCULAR | Status: DC | PRN
  Administered 2016-01-22: 0.5 mg via INTRAVENOUS

## 2016-01-22 MED ORDER — SODIUM CHLORIDE 0.9 % IV SOLN: INTRAVENOUS | Status: DC

## 2016-01-22 MED ORDER — GABAPENTIN 300 MG PO CAPS
300.0000 mg | ORAL_CAPSULE | Freq: Once | ORAL | Status: AC
  Administered 2016-01-22: 300 mg via ORAL
  Filled 2016-01-22: qty 1

## 2016-01-22 MED ORDER — FENTANYL CITRATE (PF) 100 MCG/2ML IJ SOLN
INTRAMUSCULAR | Status: DC | PRN
  Administered 2016-01-22 (×5): 50 ug via INTRAVENOUS

## 2016-01-22 MED ORDER — PHENYLEPHRINE HCL 10 MG/ML IJ SOLN
INTRAMUSCULAR | Status: DC | PRN
  Administered 2016-01-22: 80 ug via INTRAVENOUS
  Administered 2016-01-22 (×4): 40 ug via INTRAVENOUS
  Administered 2016-01-22: 80 ug via INTRAVENOUS

## 2016-01-22 MED ORDER — OXYCODONE-ACETAMINOPHEN 5-325 MG PO TABS: 1.0000 | ORAL_TABLET | ORAL | Status: DC | PRN

## 2016-01-22 MED ORDER — BISACODYL 10 MG RE SUPP: 10.0000 mg | Freq: Every day | RECTAL | Status: DC | PRN

## 2016-01-22 MED ORDER — LIDOCAINE-EPINEPHRINE 1 %-1:100000 IJ SOLN
INTRAMUSCULAR | Status: DC | PRN
Start: 2016-01-22 — End: 2016-01-22
  Administered 2016-01-22: 10 mL

## 2016-01-22 MED ORDER — ONDANSETRON HCL 4 MG/2ML IJ SOLN: 4.0000 mg | Freq: Four times a day (QID) | INTRAMUSCULAR | Status: DC | PRN

## 2016-01-22 MED ORDER — LIDOCAINE 2% (20 MG/ML) 5 ML SYRINGE
INTRAMUSCULAR | Status: AC
  Filled 2016-01-22: qty 5

## 2016-01-22 MED ORDER — DOCUSATE SODIUM 100 MG PO CAPS: 100.0000 mg | ORAL_CAPSULE | Freq: Two times a day (BID) | ORAL | Status: DC

## 2016-01-22 MED ORDER — METHOCARBAMOL 750 MG PO TABS: 750.0000 mg | ORAL_TABLET | Freq: Four times a day (QID) | ORAL | Status: DC

## 2016-01-22 MED ORDER — MUPIROCIN 2 % EX OINT
TOPICAL_OINTMENT | Freq: Once | CUTANEOUS | Status: AC
  Administered 2016-01-22: 18:00:00 via NASAL

## 2016-01-22 MED ORDER — ARTIFICIAL TEARS OP OINT
TOPICAL_OINTMENT | OPHTHALMIC | Status: AC
  Filled 2016-01-22: qty 3.5

## 2016-01-22 MED ORDER — ROCURONIUM BROMIDE 100 MG/10ML IV SOLN
INTRAVENOUS | Status: DC | PRN
  Administered 2016-01-22: 50 mg via INTRAVENOUS

## 2016-01-22 MED ORDER — PHENOL 1.4 % MT LIQD: 1.0000 | OROMUCOSAL | Status: DC | PRN

## 2016-01-22 MED ORDER — LACTATED RINGERS IV SOLN
INTRAVENOUS | Status: DC
  Administered 2016-01-22 (×3): via INTRAVENOUS

## 2016-01-22 MED ORDER — SODIUM CHLORIDE 0.9 % IR SOLN
Status: DC | PRN
  Administered 2016-01-22: 14:00:00

## 2016-01-22 MED ORDER — GLYCOPYRROLATE 0.2 MG/ML IV SOSY
PREFILLED_SYRINGE | INTRAVENOUS | Status: AC
  Filled 2016-01-22: qty 3

## 2016-01-22 MED ORDER — METHOCARBAMOL 500 MG PO TABS
750.0000 mg | ORAL_TABLET | Freq: Four times a day (QID) | ORAL | Status: DC
  Administered 2016-01-22: 750 mg via ORAL
  Filled 2016-01-22: qty 2

## 2016-01-22 MED ORDER — CEFAZOLIN SODIUM-DEXTROSE 2-3 GM-% IV SOLR
INTRAVENOUS | Status: DC | PRN
  Administered 2016-01-22: 2 g via INTRAVENOUS

## 2016-01-22 MED ORDER — SUCCINYLCHOLINE 20MG/ML (10ML) SYRINGE FOR MEDFUSION PUMP - OPTIME
INTRAMUSCULAR | Status: DC | PRN
  Administered 2016-01-22: 100 mg via INTRAVENOUS

## 2016-01-22 MED ORDER — DEXAMETHASONE SODIUM PHOSPHATE 10 MG/ML IJ SOLN
INTRAMUSCULAR | Status: DC | PRN
  Administered 2016-01-22: 10 mg via INTRAVENOUS

## 2016-01-22 MED ORDER — SODIUM CHLORIDE 0.9% FLUSH: 3.0000 mL | INTRAVENOUS | Status: DC | PRN

## 2016-01-22 MED ORDER — GLYCOPYRROLATE 0.2 MG/ML IJ SOLN
INTRAMUSCULAR | Status: DC | PRN
  Administered 2016-01-22: 0.4 mg via INTRAVENOUS

## 2016-01-22 MED ORDER — PANTOPRAZOLE SODIUM 40 MG IV SOLR: 40.0000 mg | Freq: Every day | INTRAVENOUS | Status: DC

## 2016-01-22 MED ORDER — ARTIFICIAL TEARS OP OINT
TOPICAL_OINTMENT | OPHTHALMIC | Status: DC | PRN
  Administered 2016-01-22: 1 via OPHTHALMIC

## 2016-01-22 MED ORDER — ZOLPIDEM TARTRATE 5 MG PO TABS: 5.0000 mg | ORAL_TABLET | Freq: Every evening | ORAL | Status: DC | PRN

## 2016-01-22 MED ORDER — ONDANSETRON HCL 4 MG/2ML IJ SOLN: 4.0000 mg | INTRAMUSCULAR | Status: DC | PRN

## 2016-01-22 MED ORDER — CELECOXIB 200 MG PO CAPS
200.0000 mg | ORAL_CAPSULE | Freq: Once | ORAL | Status: AC
  Administered 2016-01-22: 200 mg via ORAL
  Filled 2016-01-22: qty 1

## 2016-01-22 MED ORDER — HYDROMORPHONE HCL 1 MG/ML IJ SOLN
INTRAMUSCULAR | Status: AC
  Filled 2016-01-22: qty 1

## 2016-01-22 MED ORDER — SODIUM CHLORIDE 0.9% FLUSH: 3.0000 mL | Freq: Two times a day (BID) | INTRAVENOUS | Status: DC

## 2016-01-22 MED ORDER — FLEET ENEMA 7-19 GM/118ML RE ENEM: 1.0000 | ENEMA | Freq: Once | RECTAL | Status: DC | PRN

## 2016-01-22 MED ORDER — MUPIROCIN 2 % EX OINT
TOPICAL_OINTMENT | CUTANEOUS | Status: AC
  Administered 2016-01-22: 1
  Filled 2016-01-22: qty 22

## 2016-01-22 MED ORDER — PHENYLEPHRINE 40 MCG/ML (10ML) SYRINGE FOR IV PUSH (FOR BLOOD PRESSURE SUPPORT)
PREFILLED_SYRINGE | INTRAVENOUS | Status: AC
  Filled 2016-01-22: qty 20

## 2016-01-22 MED ORDER — PROPOFOL 10 MG/ML IV BOLUS
INTRAVENOUS | Status: AC
  Filled 2016-01-22: qty 20

## 2016-01-22 MED ORDER — PROPOFOL 10 MG/ML IV BOLUS
INTRAVENOUS | Status: DC | PRN
  Administered 2016-01-22: 180 mg via INTRAVENOUS

## 2016-01-22 MED ORDER — GABAPENTIN 300 MG PO CAPS: 300.0000 mg | ORAL_CAPSULE | Freq: Three times a day (TID) | ORAL | Status: DC

## 2016-01-22 MED ORDER — NEOSTIGMINE METHYLSULFATE 10 MG/10ML IV SOLN
INTRAVENOUS | Status: DC | PRN
  Administered 2016-01-22: 3 mg via INTRAVENOUS

## 2016-01-22 MED ORDER — CEFAZOLIN SODIUM 1 G IJ SOLR
INTRAMUSCULAR | Status: AC
  Filled 2016-01-22: qty 20

## 2016-01-22 MED ORDER — CEFAZOLIN SODIUM 1-5 GM-% IV SOLN
1.0000 g | Freq: Three times a day (TID) | INTRAVENOUS | Status: DC
  Administered 2016-01-22: 1 g via INTRAVENOUS
  Filled 2016-01-22: qty 50

## 2016-01-22 MED ORDER — OXYCODONE HCL 5 MG/5ML PO SOLN: 5.0000 mg | Freq: Once | ORAL | Status: DC | PRN

## 2016-01-22 MED ORDER — FENTANYL CITRATE (PF) 250 MCG/5ML IJ SOLN
INTRAMUSCULAR | Status: AC
  Filled 2016-01-22: qty 5

## 2016-01-22 MED ORDER — MIDAZOLAM HCL 5 MG/5ML IJ SOLN
INTRAMUSCULAR | Status: DC | PRN
  Administered 2016-01-22: 2 mg via INTRAVENOUS

## 2016-01-22 MED ORDER — DEXAMETHASONE SODIUM PHOSPHATE 10 MG/ML IJ SOLN
INTRAMUSCULAR | Status: AC
  Filled 2016-01-22: qty 1

## 2016-01-22 MED ORDER — LIDOCAINE HCL (CARDIAC) 20 MG/ML IV SOLN
INTRAVENOUS | Status: DC | PRN
  Administered 2016-01-22: 60 mg via INTRAVENOUS

## 2016-01-22 MED ORDER — SENNA 8.6 MG PO TABS: 1.0000 | ORAL_TABLET | Freq: Two times a day (BID) | ORAL | Status: DC

## 2016-01-22 MED ORDER — MIDAZOLAM HCL 2 MG/2ML IJ SOLN
INTRAMUSCULAR | Status: AC
  Filled 2016-01-22: qty 2

## 2016-01-22 MED ORDER — THROMBIN 5000 UNITS EX SOLR
OROMUCOSAL | Status: DC | PRN
  Administered 2016-01-22: 14:00:00 via TOPICAL

## 2016-01-22 MED ORDER — ROCURONIUM BROMIDE 50 MG/5ML IV SOLN
INTRAVENOUS | Status: AC
  Filled 2016-01-22: qty 1

## 2016-01-22 MED ORDER — CELECOXIB 200 MG PO CAPS: 200.0000 mg | ORAL_CAPSULE | Freq: Two times a day (BID) | ORAL | Status: DC

## 2016-01-22 MED ORDER — 0.9 % SODIUM CHLORIDE (POUR BTL) OPTIME
TOPICAL | Status: DC | PRN
  Administered 2016-01-22: 1000 mL

## 2016-01-22 MED ORDER — OXYCODONE HCL 5 MG PO TABS: 5.0000 mg | ORAL_TABLET | Freq: Once | ORAL | Status: DC | PRN

## 2016-01-22 MED ORDER — HEMOSTATIC AGENTS (NO CHARGE) OPTIME
TOPICAL | Status: DC | PRN
  Administered 2016-01-22: 1 via TOPICAL

## 2016-01-22 MED ORDER — MENTHOL 3 MG MT LOZG: 1.0000 | LOZENGE | OROMUCOSAL | Status: DC | PRN

## 2016-01-22 SURGICAL SUPPLY — 64 items
ADH SKN CLS APL DERMABOND .7 (GAUZE/BANDAGES/DRESSINGS) ×1
ADH SKN CLS LQ APL DERMABOND (GAUZE/BANDAGES/DRESSINGS) ×1
APPLICATOR CHLORAPREP 3ML ORNG (MISCELLANEOUS) ×3 IMPLANT
BIT DRILL NEURO 2X3.1 SFT TUCH (MISCELLANEOUS) ×2 IMPLANT
BLADE SURG 11 STRL SS (BLADE) ×3 IMPLANT
BLADE ULTRA TIP 2M (BLADE) IMPLANT
BONE MATRIX OSTEOCEL PRO SM (Bone Implant) ×2 IMPLANT
BUR MATCHSTICK NEURO 3.0 LAGG (BURR) ×3 IMPLANT
CAGE COROENT SM LORD 5X13X15 (Cage) ×2 IMPLANT
CANISTER SUCT 3000ML PPV (MISCELLANEOUS) ×3 IMPLANT
DECANTER SPIKE VIAL GLASS SM (MISCELLANEOUS) ×3 IMPLANT
DERMABOND ADHESIVE PROPEN (GAUZE/BANDAGES/DRESSINGS) ×2
DERMABOND ADVANCED (GAUZE/BANDAGES/DRESSINGS) ×2
DERMABOND ADVANCED .7 DNX12 (GAUZE/BANDAGES/DRESSINGS) ×1 IMPLANT
DERMABOND ADVANCED .7 DNX6 (GAUZE/BANDAGES/DRESSINGS) IMPLANT
DRAPE C-ARM 42X72 X-RAY (DRAPES) ×3 IMPLANT
DRAPE C-ARMOR (DRAPES) ×3 IMPLANT
DRAPE LAPAROTOMY 100X72 PEDS (DRAPES) ×3 IMPLANT
DRAPE MICROSCOPE LEICA (MISCELLANEOUS) ×3 IMPLANT
DRAPE POUCH INSTRU U-SHP 10X18 (DRAPES) ×3 IMPLANT
DRAPE PROXIMA HALF (DRAPES) IMPLANT
DRAPE SHEET LG 3/4 BI-LAMINATE (DRAPES) ×9 IMPLANT
DRILL BIT HELIX (BIT) ×2 IMPLANT
DRILL NEURO 2X3.1 SOFT TOUCH (MISCELLANEOUS) ×6
DRSG OPSITE POSTOP 4X6 (GAUZE/BANDAGES/DRESSINGS) ×2 IMPLANT
ELECT COATED BLADE 2.86 ST (ELECTRODE) ×3 IMPLANT
ELECT REM PT RETURN 9FT ADLT (ELECTROSURGICAL) ×3
ELECTRODE REM PT RTRN 9FT ADLT (ELECTROSURGICAL) ×1 IMPLANT
EVACUATOR 1/8 PVC DRAIN (DRAIN) IMPLANT
GAUZE SPONGE 4X4 12PLY STRL (GAUZE/BANDAGES/DRESSINGS) IMPLANT
GAUZE SPONGE 4X4 16PLY XRAY LF (GAUZE/BANDAGES/DRESSINGS) IMPLANT
GLOVE BIOGEL PI IND STRL 7.5 (GLOVE) ×1 IMPLANT
GLOVE BIOGEL PI INDICATOR 7.5 (GLOVE) ×2
GLOVE EXAM NITRILE LRG STRL (GLOVE) IMPLANT
GOWN STRL REUS W/ TWL LRG LVL3 (GOWN DISPOSABLE) ×1 IMPLANT
GOWN STRL REUS W/ TWL XL LVL3 (GOWN DISPOSABLE) ×1 IMPLANT
GOWN STRL REUS W/TWL LRG LVL3 (GOWN DISPOSABLE) ×3
GOWN STRL REUS W/TWL XL LVL3 (GOWN DISPOSABLE) ×3
HEMOSTAT POWDER KIT SURGIFOAM (HEMOSTASIS) ×3 IMPLANT
KIT BASIN OR (CUSTOM PROCEDURE TRAY) ×3 IMPLANT
KIT ROOM TURNOVER OR (KITS) ×3 IMPLANT
NDL HYPO 25X1 1.5 SAFETY (NEEDLE) ×1 IMPLANT
NDL SPNL 22GX3.5 QUINCKE BK (NEEDLE) ×1 IMPLANT
NEEDLE HYPO 25X1 1.5 SAFETY (NEEDLE) ×3 IMPLANT
NEEDLE SPNL 22GX3.5 QUINCKE BK (NEEDLE) ×3 IMPLANT
NS IRRIG 1000ML POUR BTL (IV SOLUTION) ×3 IMPLANT
PACK LAMINECTOMY NEURO (CUSTOM PROCEDURE TRAY) ×3 IMPLANT
PAD ARMBOARD 7.5X6 YLW CONV (MISCELLANEOUS) ×3 IMPLANT
PATTIES SURGICAL .5X1.5 (GAUZE/BANDAGES/DRESSINGS) ×3 IMPLANT
PLATE ARCHON 24MM 1LVL (Plate) ×2 IMPLANT
RUBBERBAND STERILE (MISCELLANEOUS) ×4 IMPLANT
SCREW ARCHON SELFTAP 4.0X13 (Screw) ×4 IMPLANT
SCREW ARCHON SELFTAP 4.0X13MM (Screw) ×4 IMPLANT
SPONGE INTESTINAL PEANUT (DISPOSABLE) ×3 IMPLANT
SPONGE SURGIFOAM ABS GEL SZ50 (HEMOSTASIS) ×2 IMPLANT
STAPLER VISISTAT 35W (STAPLE) ×3 IMPLANT
STOCKINETTE 6  STRL (DRAPES) ×2
STOCKINETTE 6 STRL (DRAPES) ×1 IMPLANT
SUT VIC AB 3-0 SH 8-18 (SUTURE) ×3 IMPLANT
TOWEL OR 17X24 6PK STRL BLUE (TOWEL DISPOSABLE) ×4 IMPLANT
TOWEL OR 17X26 10 PK STRL BLUE (TOWEL DISPOSABLE) ×3 IMPLANT
TUBE CONNECTING 12'X1/4 (SUCTIONS) ×1
TUBE CONNECTING 12X1/4 (SUCTIONS) ×2 IMPLANT
WATER STERILE IRR 1000ML POUR (IV SOLUTION) ×3 IMPLANT

## 2016-01-22 NOTE — Discharge Summary (Deleted)
Date of Admission: 01/22/16  Date of Discharge: 01/22/16  Admission Diagnosis: Cervical radiculopathy, cervical disc herniation C6-7  Discharge Diagnosis: Same  Procedure Performed: C6-7 anterior discectomy with fusion and plate fixation  [facility]  Hospital Course:  The patient was admitted for the above listed operation.  She had an uneventful post-operative course and was discharged in stable condition   Discharged Medications: Percocet, robaxin, celebrex, neurontin  Follow up: 3 weeks

## 2016-01-22 NOTE — Discharge Summary (Signed)
Date of Admission: 01/22/16  Date of Discharge: 01/22/16  Admission Diagnosis: Cervical radiculopathy, cervical disc herniation C6-7  Discharge Diagnosis: Same  Procedure Performed: C6-7 anterior discectomy with fusion and plate fixation  Attending: Jerzie Bieri  Hospital Course:  The patient was admitted for the above listed operation.  She had an uneventful post-operative course and was discharged in stable condition   Discharged Medications: Percocet, robaxin, celebrex, neurontin  Follow up: 3 weeks  

## 2016-01-22 NOTE — Progress Notes (Signed)
Discharged instructions/education/Rx given to patient with family at bedside and all questions were answered and they all verbalized understanding. Patient ate dinner well with no c/o, voiding well and walked well in the hallway with supervision. No drainage, no swelling, no redness noted on incision site. Patient d/c via wheelchair.

## 2016-01-22 NOTE — Op Note (Signed)
01/22/2016  2:59 PM  PATIENT:  Jordan Ibarra  35 y.o. female  PRE-OPERATIVE DIAGNOSIS:  Cervical radiculopathy, herniated nucleus pulposis C6-7  POST-OPERATIVE DIAGNOSIS:  same  PROCEDURE:  C6-7 anterior discectomy with fusion and plate fixation with PEEK cage and morselized allograft  SURGEON:  Hulan SaasBenjamin J. Cowen Pesqueira, MD  ASSISTANTS: Barnett AbuHenry Elsner, MD  ANESTHESIA:   General  DRAINS: Medium Hemovac  SPECIMEN:  None  INDICATION FOR PROCEDURE: 35 year old woman with cervical radiculopathy.  She has triceps weakness.  She has not gotten better with conservative management. Patient understood the risks, benefits, and alternatives and potential outcomes and wished to proceed.  PROCEDURE DETAILS: Patient was brought to the operating room placed under general endotracheal anesthesia. Patient was placed in the supine position on the operating room table. The neck was prepped with betadine and chloraprep and draped in a sterile fashion.     Local anesthestic was injected and a transverse incision was made on the right side of the neck.  Dissection was carried down thru the subcutaneous tissue and the platysma was  elevated, opened, and undermined with Metzenbaum scissors.  Dissection was then carried out thru an avascular plane leaving the sternocleidomastoid, carotid artery, and jugular vein laterally and the trachea and esophagus medially. The ventral aspect of the vertebral column was identified and a localizing x-ray was taken. The C6-7 level was identified. The longus colli muscles were then elevated and the retractor was placed. The annulus was incised and the disc space entered. Discectomy was performed with micro-curettes and pituitary and kerrison rongeurs. I then used the high-speed drill to drill the endplates down to the level of the posterior longitudinal ligament. The operating microscope was draped and brought into the field provided additional magnification, illumination and  visualization. Utilizing microsurgical technique, discectomy was continued posteriorly thru the disc space. Posterior longitudinal ligament was opened with a nerve hook, and then removed along with disc herniation and osteophytes, decompressing the spinal canal and thecal sac. We then continued to remove osteophytic overgrowth and disc material decompressing the neural foramina and exiting nerve roots bilaterally. So by both visualization and palpation we felt we had an adequate decompression of the neural elements. We then measured the height of the intravertebral disc space and selected a 7 millimeter Peek interbody cage packed with morcellized allograft. It was then gently positioned in the intravertebral disc space and countersunk. I then used a 24mm plate and placed two variable angle screws into the C6 body and two fixed angle screws into the C7 body and locked them into position. The wound was irrigated with bacitracin solution, checked for hemostasis which was established and confirmed. Once meticulous hemostasis was achieved, we then proceeded with closure. The platysma was closed with interrupted 3-0 undyed Vicryl suture, the subcuticular layer was closed with interrupted 3-0 undyed Vicryl suture. The skin edges were approximated with dermabond. The drapes were removed. A sterile dressing was applied. The patient was then awakened from general anesthesia and transferred to the recovery room in stable condition. At the end of the procedure all sponge, needle and instrument counts were correct.   PATIENT DISPOSITION:  PACU - hemodynamically stable.   Delay start of Pharmacological VTE agent (>24hrs) due to surgical blood loss or risk of bleeding:  yes

## 2016-01-22 NOTE — Anesthesia Procedure Notes (Signed)
Procedure Name: Intubation Date/Time: 01/22/2016 1:27 PM Performed by: Fransisca KaufmannMEYER, Emberli Ballester E Pre-anesthesia Checklist: Patient identified, Emergency Drugs available, Suction available and Patient being monitored Patient Re-evaluated:Patient Re-evaluated prior to inductionOxygen Delivery Method: Circle System Utilized Preoxygenation: Pre-oxygenation with 100% oxygen Intubation Type: IV induction Ventilation: Mask ventilation without difficulty Laryngoscope Size: Miller and 2 Grade View: Grade II Tube type: Oral Tube size: 7.0 mm Number of attempts: 1 Airway Equipment and Method: Stylet and Oral airway Placement Confirmation: ETT inserted through vocal cords under direct vision,  positive ETCO2 and breath sounds checked- equal and bilateral Secured at: 22 cm Tube secured with: Tape Dental Injury: Teeth and Oropharynx as per pre-operative assessment  Difficulty Due To: Difficult Airway- due to limited oral opening

## 2016-01-22 NOTE — H&P (Signed)
CC:  No chief complaint on file.   HPI: Jordan Ibarra is a 35 y.o. female with cervical radiculopathy manifesting as right arm pain and triceps weakness.  She has a disc herniation at C6-7.  She presents for anterior cervical discectomy and fusion.  PMH: Past Medical History  Diagnosis Date  . GERD (gastroesophageal reflux disease)     otc  Tums    PSH: Past Surgical History  Procedure Laterality Date  . Appendectomy    . Abdominal hysterectomy    . Tonsillectomy      SH: Social History  Substance Use Topics  . Smoking status: Current Every Day Smoker    Types: Cigarettes  . Smokeless tobacco: None  . Alcohol Use: Yes     Comment: "Not that often"    MEDS: Prior to Admission medications   Medication Sig Start Date End Date Taking? Authorizing Provider  meloxicam (MOBIC) 7.5 MG tablet Take 7.5 mg by mouth every 12 (twelve) hours.   Yes Historical Provider, MD  methocarbamol (ROBAXIN) 500 MG tablet Take 1 tablet (500 mg total) by mouth every 8 (eight) hours as needed for muscle spasms. 10/09/15  Yes Azalia BilisKevin Campos, MD  cyclobenzaprine (FLEXERIL) 10 MG tablet Take 1 tablet (10 mg total) by mouth 2 (two) times daily as needed for muscle spasms. Patient not taking: Reported on 01/17/2016 12/09/15   Eyvonne MechanicJeffrey Hedges, PA-C  ibuprofen (ADVIL,MOTRIN) 600 MG tablet Take 1 tablet (600 mg total) by mouth every 8 (eight) hours as needed. Patient not taking: Reported on 01/17/2016 10/09/15   Azalia BilisKevin Campos, MD    ALLERGY: Allergies  Allergen Reactions  . Latex Rash    ROS: ROS  NEUROLOGIC EXAM: Awake, alert, oriented Memory and concentration grossly intact Speech fluent, appropriate CN grossly intact Motor exam: Upper Extremities Deltoid Bicep Tricep Grip  Right 5/5 5/5 4/5 5/5  Left 5/5 5/5 5/5 5/5   Lower Extremity IP Quad PF DF EHL  Right 5/5 5/5 5/5 5/5 5/5  Left 5/5 5/5 5/5 5/5 5/5   Sensation grossly intact to LT  IMAGING: No new imaging  IMPRESSION: - 35 y.o.  female with cervical radiculopathy due to disc herniation at C6-7.  PLAN: - C6-7 ACDF

## 2016-01-22 NOTE — Transfer of Care (Signed)
Immediate Anesthesia Transfer of Care Note  Patient: Jordan Ibarra  Procedure(s) Performed: Procedure(s) with comments: Cervical six - seven Anterior cervical discectomy with fusion and plate fixation (N/A) - C6-7 Anterior cervical discectomy with fusion and plate fixation  Patient Location: PACU  Anesthesia Type:General  Level of Consciousness: awake and alert   Airway & Oxygen Therapy: Patient Spontanous Breathing and Patient connected to nasal cannula oxygen  Post-op Assessment: Report given to RN, Post -op Vital signs reviewed and stable and Patient moving all extremities X 4  Post vital signs: Reviewed and stable  Last Vitals:  Filed Vitals:   01/22/16 0944 01/22/16 1514  BP: 101/77   Pulse: 78   Temp: 36.7 C 36.5 C  Resp: 18     Last Pain:  Filed Vitals:   01/22/16 1521  PainSc: 9       Patients Stated Pain Goal: 2 (01/22/16 0954)  Complications: No apparent anesthesia complications

## 2016-01-22 NOTE — Progress Notes (Signed)
No issues Moving arms and legs well Voice good Incision flat Stable TTF Hopefully home tonight

## 2016-01-22 NOTE — Anesthesia Preprocedure Evaluation (Signed)
Anesthesia Evaluation  Patient identified by MRN, date of birth, ID band Patient awake    Reviewed: Allergy & Precautions, H&P , NPO status , Patient's Chart, lab work & pertinent test results  Airway Mallampati: II   Neck ROM: full    Dental   Pulmonary Current Smoker,    breath sounds clear to auscultation       Cardiovascular negative cardio ROS   Rhythm:regular Rate:Normal     Neuro/Psych    GI/Hepatic GERD  ,  Endo/Other  obese  Renal/GU      Musculoskeletal   Abdominal   Peds  Hematology   Anesthesia Other Findings   Reproductive/Obstetrics                             Anesthesia Physical Anesthesia Plan  ASA: II  Anesthesia Plan: General   Post-op Pain Management:    Induction: Intravenous  Airway Management Planned: Oral ETT  Additional Equipment:   Intra-op Plan:   Post-operative Plan: Extubation in OR  Informed Consent: I have reviewed the patients History and Physical, chart, labs and discussed the procedure including the risks, benefits and alternatives for the proposed anesthesia with the patient or authorized representative who has indicated his/her understanding and acceptance.     Plan Discussed with: CRNA, Anesthesiologist and Surgeon  Anesthesia Plan Comments:         Anesthesia Quick Evaluation

## 2016-01-23 ENCOUNTER — Encounter (HOSPITAL_COMMUNITY): Payer: Self-pay | Admitting: Neurological Surgery

## 2016-01-23 NOTE — Anesthesia Postprocedure Evaluation (Signed)
Anesthesia Post Note  Patient: Jordan Ibarra  Procedure(s) Performed: Procedure(s) (LRB): Cervical six - seven Anterior cervical discectomy with fusion and plate fixation (N/A)  Patient location during evaluation: PACU Anesthesia Type: General Level of consciousness: awake and alert and patient cooperative Pain management: pain level controlled Vital Signs Assessment: post-procedure vital signs reviewed and stable Respiratory status: spontaneous breathing and respiratory function stable Cardiovascular status: stable Anesthetic complications: no    Last Vitals:  Filed Vitals:   01/22/16 1628 01/22/16 2015  BP: 110/70 114/68  Pulse: 64 61  Temp: 36.8 C 36.7 C  Resp: 18 20    Last Pain:  Filed Vitals:   01/22/16 2050  PainSc: 3                  Maricia Scotti S

## 2016-04-29 ENCOUNTER — Ambulatory Visit: Payer: Medicaid Other | Attending: Neurological Surgery | Admitting: Physical Therapy

## 2016-04-29 DIAGNOSIS — M546 Pain in thoracic spine: Secondary | ICD-10-CM | POA: Insufficient documentation

## 2016-04-29 DIAGNOSIS — M542 Cervicalgia: Secondary | ICD-10-CM | POA: Insufficient documentation

## 2016-04-29 DIAGNOSIS — M6281 Muscle weakness (generalized): Secondary | ICD-10-CM

## 2016-04-29 DIAGNOSIS — R293 Abnormal posture: Secondary | ICD-10-CM

## 2016-04-29 NOTE — Therapy (Addendum)
[facility] [address] [place], [place], [address] Phone: [phone]   Fax:  [phone]  Physical Therapy Evaluation/Discharge  Patient Details  Name: Jordan Ibarra MRN: [id number] Date of Birth: 1980-10-10 Referring Provider: Dr. Bevely Palmer   Encounter Date: 04/29/2016      PT End of Session - 04/29/16 1538    Visit Number 1   Number of Visits 8   Date for PT Re-Evaluation 05/30/16   PT Start Time 1500   PT Stop Time 1545   PT Time Calculation (min) 45 min   Activity Tolerance Patient tolerated treatment well   Behavior During Therapy [facility] for tasks assessed/performed      Past Medical History:  Diagnosis Date  . GERD (gastroesophageal reflux disease)    otc  Tums    Past Surgical History:  Procedure Laterality Date  . ABDOMINAL HYSTERECTOMY    . ANTERIOR CERVICAL DECOMP/DISCECTOMY FUSION N/A 01/22/2016   Procedure: Cervical six - seven Anterior cervical discectomy with fusion and plate fixation;  Surgeon: Loura Halt Ditty, MD;  Location: [facility];  Service: Neurosurgery;  Laterality: N/A;  C6-7 Anterior cervical discectomy with fusion and plate fixation  . APPENDECTOMY    . TONSILLECTOMY      There were no vitals filed for this visit.       Subjective Assessment - 04/29/16 1509    Subjective Patient underwent surgery for disc herniation, C6-C7, ACDF.  At her last check up she was referred to PT due to ongoing pain.  She describes diff maintaining head positions (has to move frequently). She has headaches with leaning head to Rt. , has sharp pain into her low back with Leaning L..  She has stiffness, both hands go numb and denies weakness.  Denies balance problems.    Pertinent History ACDF C6-C7,  01/22/16   Limitations Reading;House hold activities;Other (comment);Lifting;Sitting  playing with nephews   Diagnostic tests XR neg for change post surgical    Patient Stated Goals patient would like to be  able to move without hurting    Currently in Pain? Yes   Pain Score 2    Pain Location Neck   Pain Orientation Lower   Pain Type Chronic pain;Surgical pain   Pain Radiating Towards low back    Pain Onset More than a month ago   Pain Frequency Intermittent   Aggravating Factors  certain head positions   Pain Relieving Factors rest, Crista Elliot, meds    Multiple Pain Sites No            [facility] PT Assessment - 04/29/16 1519      Assessment   Medical Diagnosis neck pain post fusion    Referring Provider Dr. 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    Leisure time with family, outdoors     Cognition   Overall Cognitive Status Within Functional Limits for tasks assessed     Observation/Other Assessments   Focus on Therapeutic  Outcomes (FOTO)  48%     Sensation   Light Touch Appears Intact   Additional Comments hands go numb frequently      Posture/Postural Control   Posture/Postural Control Postural limitations   Postural Limitations Rounded Shoulders;Forward head     AROM   Cervical Flexion 40   Cervical Extension 37   Cervical - Right Side Bend 44   Cervical - Left Side Bend 40  sharp pain down into low back   Cervical - Right Rotation 52   Cervical - Left Rotation 50     Strength   Right Shoulder Flexion 4+/5   Left Shoulder Flexion 4/5   Right Elbow Flexion 5/5   Right Elbow Extension 4+/5   Left Elbow Flexion 5/5   Left Elbow Extension 3+/5     Palpation   Palpation comment hypertonic upper traps, large painful knot Rt. side , very sore through  suboccipitals              PT Education - 04/29/16 1553    Education provided Yes   Education Details PT/POC, financials, posture, head position and HEP    Person(s) Educated Patient   Methods Explanation;Demonstration;Handout   Comprehension Verbalized understanding;Returned demonstration          PT Short Term Goals - 04/29/16 1556      PT SHORT TERM GOAL #1   Title Pt with be I with initial HEP for posture, AROM    Time 4   Period Weeks   Status New     PT SHORT TERM GOAL #2   Title Pt will be able to demo correct lifting techniques without increasing pain.    Time 4   Period Weeks   Status New     PT SHORT TERM GOAL #3   Title Pt will report no headaches or back pain triggered by AROM of C -spine.    Time 4   Period Weeks           PT Long Term Goals - 04/29/16 1558      PT LONG TERM GOAL #1   Title Further goals to be assessed if she returns.                 Plan - 04/29/16 1554    Clinical Impression Statement Patient with low complexity eval of cervical pain following ACDF 01/22/16 C6-C7 (CPT 22551) .  She lacks full AROM in cervical spine and has increased pain which refers into head and back.  She has no coverage for this visit, but does have temporary MCD. She may have coverage for PT with her new job, she may wait to return until this is verified.  Until then she has a very basic HEP and education tips for posture.    Rehab Potential Excellent   PT Frequency 1x / week   PT Duration 8 weeks  if she can manage finances   PT Treatment/Interventions ADLs/Self Care Home Management;Moist Heat;Therapeutic activities;Therapeutic exercise;Manual techniques;Taping;Passive range of motion;Electrical Stimulation;Cryotherapy;Ultrasound;Neuromuscular re-education;Patient/family education;Functional mobility training   PT Next Visit Plan check HEP and reinforce posture with lifting, scap stab, manual to post cervicals and upper trap   PT Home Exercise  Plan chin tuck, scapular retraction    Consulted and Agree with Plan of Care Patient      Patient will benefit from skilled therapeutic intervention in order to improve the following deficits and impairments:  Decreased range of motion, Increased fascial restricitons, Pain, Impaired UE functional use,  Improper body mechanics, Impaired flexibility, Hypomobility, Decreased mobility, Decreased strength, Postural dysfunction  Visit Diagnosis: Cervicalgia  Pain in thoracic spine  Muscle weakness (generalized)  Abnormal posture     Problem List Patient Active Problem List   Diagnosis Date Noted  . Cervical radiculopathy 01/22/2016  . Bilateral hand pain 11/15/2015  . CAP (community acquired pneumonia) 11/15/2015  . Chest wall pain 04/17/2015    Jordan Ibarra 04/29/2016, 4:06 PM  [facility] [address] [place], [place], [address] Phone: [phone]   Fax:  [phone]  Name: Jordan Ibarra MRN: [id number] Date of Birth: [birth year]  Raeford Razor, PT 04/29/16 4:06 PM Phone: [phone] Fax: [phone]   PHYSICAL THERAPY DISCHARGE SUMMARY  Visits from Start of Care: 1  Current functional level related to goals / functional outcomes: See last session for most recent    Remaining deficits: Unknown has not returned.    Education / Equipment: HEP, posture  Plan: Patient agrees to discharge.  Patient goals were not met. Patient is being discharged due to not returning since the last visit.  ?????    Raeford Razor, PT 06/18/16 10:09 AM Phone: [phone] Fax: [phone]

## 2016-04-29 NOTE — Patient Instructions (Signed)
Head Press With Chin Tuck    Tuck chin SLIGHTLY toward chest, keep mouth closed. Feel weight on back of head. Increase weight by pressing head down. Hold __5_ seconds. Relax. Repeat __10 _ times. Surface: floor /bed   Copyright  VHI. All rights reserved.  Scapular Retraction (Standing)    With arms at sides, pinch shoulder blades together. Repeat __10-20__ times per set. Do __2__ sets per session. Do __2_ sessions per day.  http://orth.exer.us/945   Copyright  VHI. All rights reserved.    Posture Tips DO: - stand tall and erect - keep chin tucked in - keep head and shoulders in alignment - check posture regularly in mirror or large window - pull head back against headrest in car seat;  Change your position often.  Sit with lumbar support. DON'T: - slouch or slump while watching TV or reading - sit, stand or lie in one position  for too long;  Sitting is especially hard on the spine so if you sit at a desk/use the computer, then stand up often!   Copyright  VHI. All rights reserved.  Posture - Standing   Good posture is important. Avoid slouching and forward head thrust. Maintain curve in low back and align ears over shoul- ders, hips over ankles.  Pull your belly button in toward your back bone.   Copyright  VHI. All rights reserved.  Posture - Sitting   Sit upright, head facing forward. Try using a roll to support lower back. Keep shoulders relaxed, and avoid rounded back. Keep hips level with knees. Avoid crossing legs for long periods.   Copyright  VHI. All rights reserved.

## 2016-05-06 ENCOUNTER — Encounter: Payer: Medicaid Other | Admitting: Physical Therapy

## 2016-05-13 ENCOUNTER — Ambulatory Visit: Payer: BLUE CROSS/BLUE SHIELD | Attending: Neurological Surgery | Admitting: Physical Therapy

## 2016-05-16 ENCOUNTER — Ambulatory Visit: Payer: Self-pay | Admitting: Family Medicine

## 2016-05-20 ENCOUNTER — Encounter: Payer: Medicaid Other | Admitting: Physical Therapy

## 2016-06-06 ENCOUNTER — Emergency Department (HOSPITAL_COMMUNITY)
Admission: EM | Admit: 2016-06-06 | Discharge: 2016-06-06 | Disposition: A | Discharge status: Home / Self Care | Payer: BLUE CROSS/BLUE SHIELD | Attending: Emergency Medicine | Admitting: Emergency Medicine

## 2016-06-06 ENCOUNTER — Encounter (HOSPITAL_COMMUNITY): Payer: Self-pay | Admitting: *Deleted

## 2016-06-06 ENCOUNTER — Emergency Department (HOSPITAL_COMMUNITY): Payer: BLUE CROSS/BLUE SHIELD

## 2016-06-06 DIAGNOSIS — Z79899 Other long term (current) drug therapy: Secondary | ICD-10-CM | POA: Insufficient documentation

## 2016-06-06 DIAGNOSIS — F17210 Nicotine dependence, cigarettes, uncomplicated: Secondary | ICD-10-CM | POA: Diagnosis not present

## 2016-06-06 DIAGNOSIS — R0789 Other chest pain: Secondary | ICD-10-CM | POA: Insufficient documentation

## 2016-06-06 DIAGNOSIS — R079 Chest pain, unspecified: Secondary | ICD-10-CM

## 2016-06-06 LAB — I-STAT TROPONIN, ED: TROPONIN I, POC: 0 ng/mL (ref 0.00–0.08)

## 2016-06-06 LAB — CBC
HCT: 41.9 % (ref 36.0–46.0)
Hemoglobin: 14.4 g/dL (ref 12.0–15.0)
MCH: 28.6 pg (ref 26.0–34.0)
MCHC: 34.4 g/dL (ref 30.0–36.0)
MCV: 83.3 fL (ref 78.0–100.0)
PLATELETS: 277 10*3/uL (ref 150–400)
RBC: 5.03 MIL/uL (ref 3.87–5.11)
RDW: 12.8 % (ref 11.5–15.5)
WBC: 10 10*3/uL (ref 4.0–10.5)

## 2016-06-06 LAB — BASIC METABOLIC PANEL
Anion gap: 6 (ref 5–15)
BUN: 25 mg/dL — AB (ref 6–20)
CALCIUM: 9.5 mg/dL (ref 8.9–10.3)
CHLORIDE: 106 mmol/L (ref 101–111)
CO2: 29 mmol/L (ref 22–32)
CREATININE: 0.8 mg/dL (ref 0.44–1.00)
GFR calc non Af Amer: >60 mL/min (ref >60)
Glucose, Bld: 95 mg/dL (ref 65–99)
Potassium: 4.3 mmol/L (ref 3.5–5.1)
SODIUM: 141 mmol/L (ref 135–145)

## 2016-06-06 IMAGING — DX DG CHEST 2V
2 series · 2 of 2 positions shown · non-contrast
Comparison: 04/01/2015

CLINICAL DATA: Chest pain, shortness of Breath

EXAM:
CHEST  2 VIEW

[chest pa]
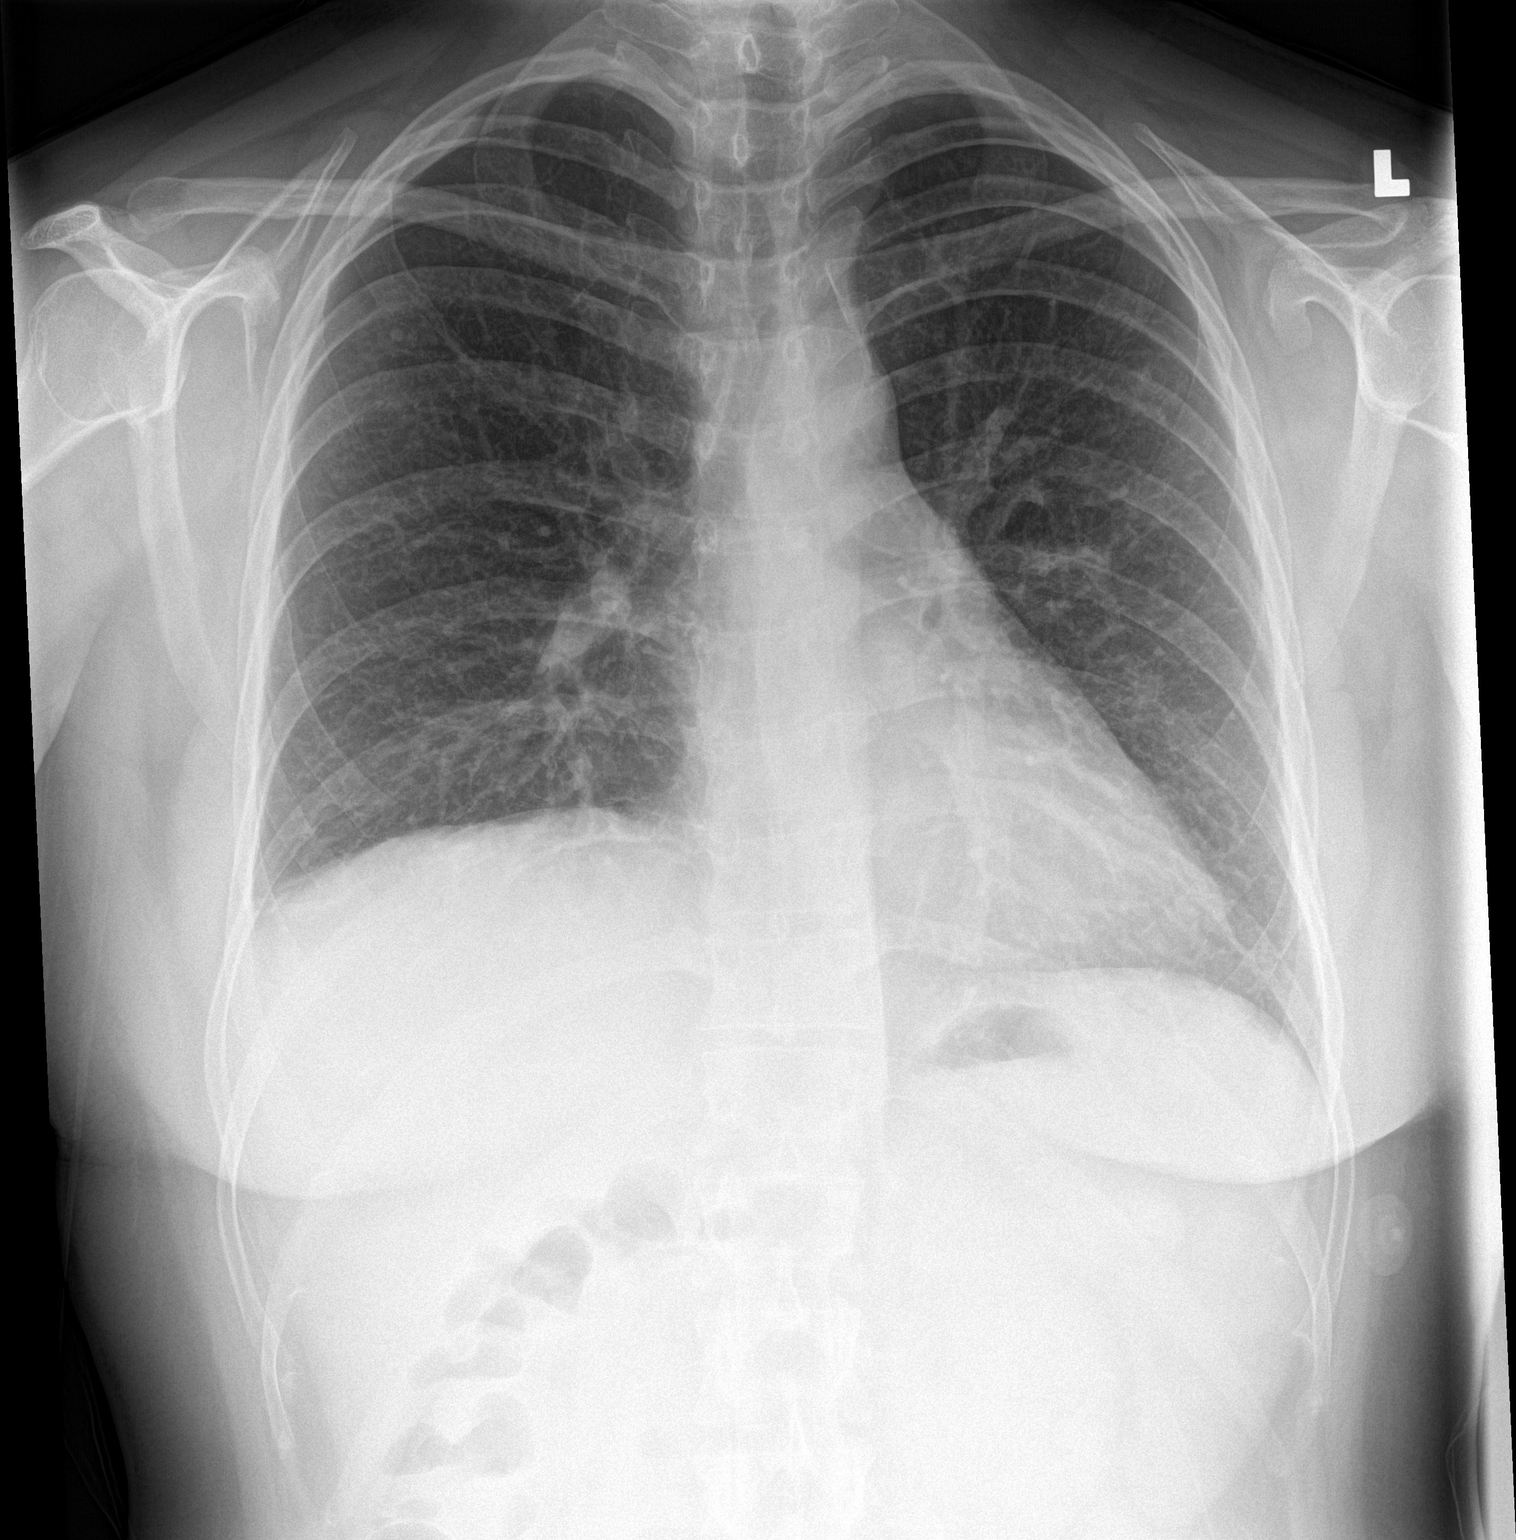

[chest lat]
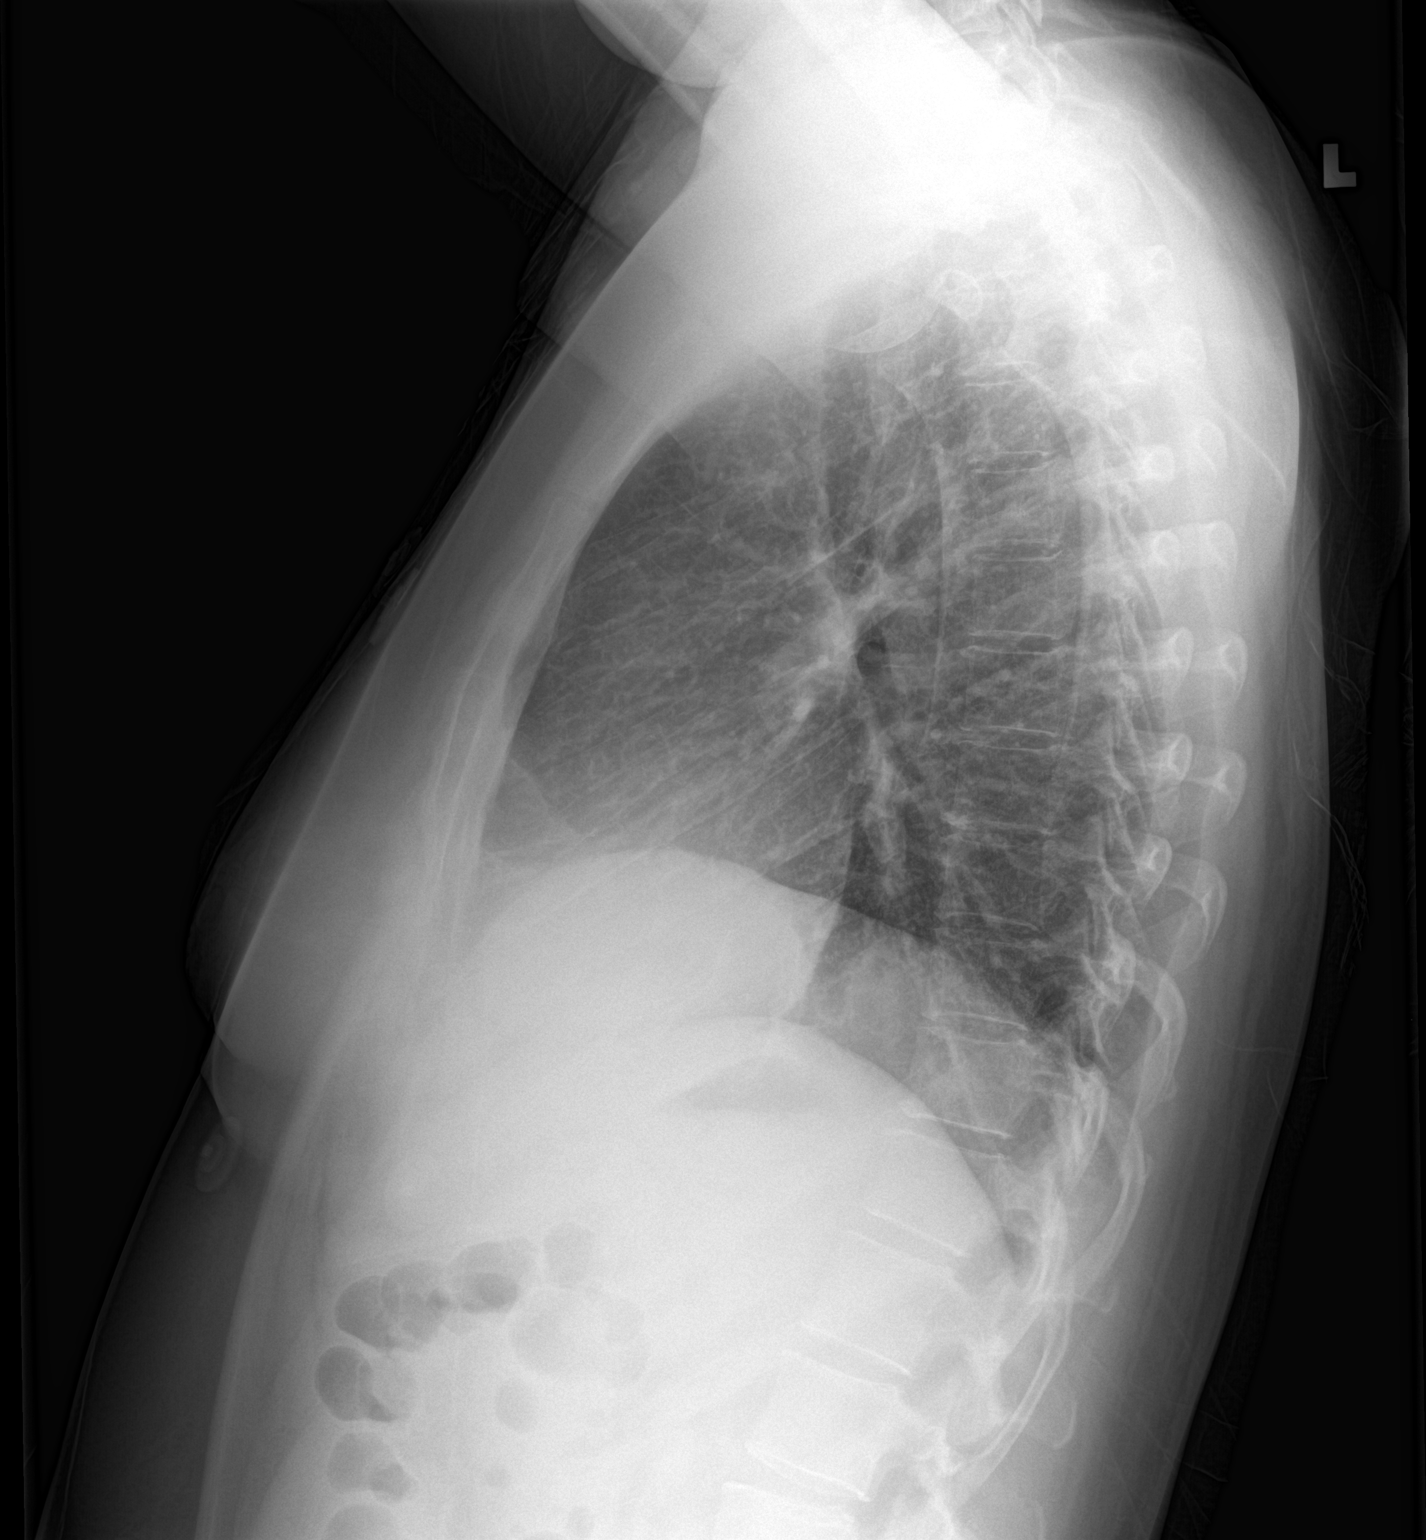

[2 of 2 positions shown; findings below may reference images not displayed]

FINDINGS: Cardiomediastinal silhouette is stable. Mild elevation of the right
hemidiaphragm again noted. No acute infiltrate or pleural effusion.
No pulmonary edema. Bony thorax is unremarkable.
IMPRESSION: No active disease. Elevation of the right hemidiaphragm again noted.

## 2016-06-06 NOTE — ED Triage Notes (Signed)
Pt states around 1000 she developed rt chest pain with deep breaths and rt arm movement. No other symptoms noted with it

## 2016-06-06 NOTE — ED Provider Notes (Signed)
[facility]-EMERGENCY DEPT Provider Note   CSN: [id number] Arrival date & time: 06/06/16  1350     History   Chief Complaint Chief Complaint  Patient presents with  . Chest Pain    with movement    HPI Jordan Ibarra is a 35 y.o. female.  HPI   Patient is a [age] to female with a history of GERD, cervical spine surgery who presents the ED with right sided chest wall pain that began roughly 10:30 this morning. Patient states she went to dishes and felt a squeezing sensation/dull pain in her right-sided chest with lifting her arm and deep inspiration. Pain is nonradiating, 8/10 at rest, 10/10 with raising of her right arm or with deep breath. Patient is not tried anything for this. Patient is on chronic pain medications for her neck pain. Patient states pain is worse with lying flat with associated difficulty taking a deep breath. Patient denies diaphoresis, headache, dizziness, visual changes, nausea, vomiting, diarrhea, abdominal pain, shortness of breath. 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  Past Medical History:  Diagnosis Date  . GERD (gastroesophageal reflux disease)    otc  Tums    Patient Active Problem List   Diagnosis Date Noted  . Cervical radiculopathy 01/22/2016  . Bilateral hand pain 11/15/2015  . CAP (community acquired pneumonia) 11/15/2015  . Chest wall pain 04/17/2015    Past Surgical History:  Procedure Laterality Date  . ABDOMINAL HYSTERECTOMY    . ANTERIOR CERVICAL DECOMP/DISCECTOMY FUSION N/A 01/22/2016   Procedure: Cervical six - seven Anterior cervical discectomy with fusion and plate fixation;  Surgeon: Loura HaltBenjamin Jared Ditty, MD;  Location: [facility];  Service: Neurosurgery;  Laterality: N/A;  C6-7 Anterior cervical discectomy with fusion and plate fixation  . APPENDECTOMY    . TONSILLECTOMY      OB History    No data available       Home Medications    Prior to Admission medications   Medication Sig Start Date End Date Taking? Authorizing  Provider  cyclobenzaprine (FLEXERIL) 5 MG tablet Take 5 mg by mouth at bedtime.   Yes Historical Provider, MD  gabapentin (NEURONTIN) 300 MG capsule Take 1 capsule (300 mg total) by mouth 3 (three) times daily. Patient taking differently: Take 300 mg by mouth at bedtime.  01/22/16  Yes Loura HaltBenjamin Jared Ditty, MD  methocarbamol (ROBAXIN-750) 750 MG tablet Take 1 tablet (750 mg total) by mouth 4 (four) times daily. Patient taking differently: Take 750 mg by mouth 2 (two) times daily.  01/22/16  Yes Loura HaltBenjamin Jared Ditty, MD  oxyCODONE-acetaminophen (ROXICET) 5-325 MG tablet Take 1-2 tablets by mouth every 4 (four) hours as needed for severe pain. Patient not taking: Reported on 06/06/2016 01/22/16   Loura HaltBenjamin Jared Ditty, MD    Family History No family history on file.  Social History Social History  Substance Use Topics  . Smoking status: Current Every Day Smoker    Types: Cigarettes  . Smokeless tobacco: Never Used  . Alcohol use Yes     Comment: "Not that often"     Allergies   Latex   Review of Systems Review of Systems  Constitutional: Negative for chills and fever.  HENT: Negative for sore throat.   Eyes: Negative for visual disturbance.  Respiratory: Negative for cough and shortness of breath.   Cardiovascular: Positive for chest pain (right sided). Negative for leg swelling.  Gastrointestinal: Negative for abdominal pain, nausea and vomiting.  Genitourinary: Negative for dysuria.  Musculoskeletal:  Negative for back pain.  Skin: Negative for rash.  Neurological: Negative for dizziness, syncope, weakness, numbness and headaches.     Physical Exam Updated Vital Signs BP 112/75 (BP Location: Left Arm)   Pulse 77   Temp 97.3 F (36.3 C) (Oral)   Resp 20   Ht 5\' 5"  (1.651 m)   Wt 83 kg   SpO2 94%   BMI 30.45 kg/m   Physical Exam  Constitutional: She appears well-developed and well-nourished. No distress.  HENT:  Head: Normocephalic and atraumatic.  Eyes:  Conjunctivae are normal.  Neck: Normal range of motion.  Cardiovascular: Normal rate, regular rhythm and normal heart sounds.  Exam reveals no gallop and no friction rub.   No murmur heard. Pulses:      Radial pulses are 2+ on the right side, and 2+ on the left side.       Dorsalis pedis pulses are 2+ on the right side, and 2+ on the left side.  Pulmonary/Chest: Effort normal and breath sounds normal. No respiratory distress. She has no decreased breath sounds. She has no wheezes. She has no rhonchi. She has no rales. She exhibits tenderness. She exhibits no mass, no crepitus, no edema, no deformity and no swelling.    Pt's pain reproducible on palpation of right sided chest wall  Musculoskeletal: Normal range of motion. She exhibits no edema.  No C, T or L spine tenderness  Neurological: She is alert. Coordination normal.  Skin: Skin is warm and dry. She is not diaphoretic.  Psychiatric: She has a normal mood and affect. Her behavior is normal.  Nursing note and vitals reviewed.    ED Treatments / Results  Labs (all labs ordered are listed, but only abnormal results are displayed) Labs Reviewed  BASIC METABOLIC PANEL - Abnormal; Notable for the following:       Result Value   BUN 25 (*)    All other components within normal limits  CBC  I-STAT TROPOININ, ED    EKG  EKG Interpretation  Date/Time:  Friday June 06 2016 14:03:55 EDT Ventricular Rate:  76 PR Interval:    QRS Duration: 107 QT Interval:  407 QTC Calculation: 458 R Axis:   87 Text Interpretation:  Sinus rhythm RSR' in V1 or V2, right VCD or RVH Nonspecific T abnormalities, lateral leads Baseline wander in lead(s) II III aVF V3 V5 No significant change since last tracing Confirmed by LITTLE MD, RACHEL [phone]) on 06/06/2016 4:56:02 PM       Radiology Dg Chest 2 View  Result Date: 06/06/2016 CLINICAL DATA:  Centralized chest pain and shortness breath for the past 4 hours. History of smoking. EXAM: CHEST  2  VIEW COMPARISON:  09/17/2015; 09/06/2015; chest CT - 09/07/2015 FINDINGS: Grossly unchanged cardiac silhouette and mediastinal contours with partial obscuration of the right heart border secondary to persistent elevation / eventration of the right hemidiaphragm. There is mild diffuse slightly nodular thickening of the pulmonary interstitium. No focal airspace opacities. No pleural effusion or pneumothorax. No evidence of edema. No acute osseus abnormalities. Post lower cervical ACDF, incompletely evaluated. IMPRESSION: Chronic bronchitic change without acute cardiopulmonary disease. Electronically Signed   By: Simonne Come M.D.   On: 06/06/2016 14:32    Procedures Procedures (including critical care time)  Medications Ordered in ED Medications - No data to display   Initial Impression / Assessment and Plan / ED Course  I have reviewed the triage vital signs and the nursing notes.  Pertinent labs &  imaging results that were available during my care of the patient were reviewed by me and considered in my medical decision making (see chart for details).  Clinical Course   Patient is to be discharged with recommendation to follow up with PCP in regards to today's hospital visit. Chest pain is not likely of cardiac or pulmonary etiology d/t presentation, perc negative, VSS, no tracheal deviation, no JVD or new murmur, RRR, breath sounds equal bilaterally, EKG without acute abnormalities, negative troponin, and negative CXR. Pain reproducible on exam likely 2/2 Costochondritis. Pt has been advised to continue home medications and return to the ED if CP becomes exertional, associated with diaphoresis or nausea, radiates to left jaw/arm, worsens or becomes concerning in any way. Instructed the patient to follow-up with the primary care provider in 2 days to be reevaluated. Pt appears reliable for follow up and is agreeable to discharge.   Case has been discussed with and seen by Dr. Clarene Duke who agrees with  the above plan to discharge.    Final Clinical Impressions(s) / ED Diagnoses   Final diagnoses:  Chest pain, unspecified chest pain type    New Prescriptions Discharge Medication List as of 06/06/2016  5:48 PM       Jerre Simon, PA 06/06/16 1819    Laurence Spates, MD 06/07/16 [phone]

## 2016-06-06 NOTE — Discharge Instructions (Signed)
Continue to take your home medications. Follow up with your primary care provider in 2 days to be reevaluated. Return immediately to the emergency department if you experience chest pain associated with sweating, shortness of breath, nausea, you pass out, fever or any other concerning symptoms.

## 2016-07-18 ENCOUNTER — Ambulatory Visit (INDEPENDENT_AMBULATORY_CARE_PROVIDER_SITE_OTHER): Payer: BLUE CROSS/BLUE SHIELD

## 2016-07-18 ENCOUNTER — Ambulatory Visit (INDEPENDENT_AMBULATORY_CARE_PROVIDER_SITE_OTHER): Payer: BLUE CROSS/BLUE SHIELD | Admitting: Physician Assistant

## 2016-07-18 VITALS — BP 128/82 | HR 85 | Temp 97.8°F | Resp 17 | Ht 66.5 in | Wt 185.0 lb

## 2016-07-18 DIAGNOSIS — R05 Cough: Secondary | ICD-10-CM

## 2016-07-18 DIAGNOSIS — R69 Illness, unspecified: Secondary | ICD-10-CM | POA: Diagnosis not present

## 2016-07-18 DIAGNOSIS — R059 Cough, unspecified: Secondary | ICD-10-CM

## 2016-07-18 DIAGNOSIS — J111 Influenza due to unidentified influenza virus with other respiratory manifestations: Secondary | ICD-10-CM

## 2016-07-18 LAB — POCT CBC
Granulocyte percent: 58.3 %G (ref 37–80)
HCT, POC: 40.1 % (ref 37.7–47.9)
Hemoglobin: 14.4 g/dL (ref 12.2–16.2)
LYMPH, POC: 3.1 (ref 0.6–3.4)
MCH, POC: 29.6 pg (ref 27–31.2)
MCHC: 36 g/dL — AB (ref 31.8–35.4)
MCV: 82.1 fL (ref 80–97)
MID (cbc): 0.6 (ref 0–0.9)
MPV: 8.5 fL (ref 0–99.8)
PLATELET COUNT, POC: 239 10*3/uL (ref 142–424)
POC Granulocyte: 5.1 (ref 2–6.9)
POC LYMPH %: 34.8 % (ref 10–50)
POC MID %: 6.9 %M (ref 0–12)
RBC: 4.88 M/uL (ref 4.04–5.48)
RDW, POC: 12.7 %
WBC: 8.8 10*3/uL (ref 4.6–10.2)

## 2016-07-18 LAB — POCT INFLUENZA A/B
INFLUENZA A, POC: NEGATIVE
Influenza B, POC: NEGATIVE

## 2016-07-18 MED ORDER — AZITHROMYCIN 250 MG PO TABS: ORAL_TABLET | ORAL | 0 refills | Status: DC

## 2016-07-18 MED ORDER — HYDROCODONE-HOMATROPINE 5-1.5 MG/5ML PO SYRP
5.0000 mL | ORAL_SOLUTION | Freq: Two times a day (BID) | ORAL | 0 refills | Status: DC
Start: 2016-07-18 — End: 2018-01-03

## 2016-07-18 NOTE — Patient Instructions (Signed)
     IF you received an x-ray today, you will receive an invoice from [facility]. Please contact  Radiology at [phone] with questions or concerns regarding your invoice.   IF you received labwork today, you will receive an invoice from [facility]. Please contact [facility] at [phone] with questions or concerns regarding your invoice.   Our billing staff will not be able to assist you with questions regarding bills from these companies.  You will be contacted with the lab results as soon as they are available. The fastest way to get your results is to activate your My Chart account. Instructions are located on the last page of this paperwork. If you have not heard from us regarding the results in 2 weeks, please contact this office.      

## 2016-07-18 NOTE — Progress Notes (Signed)
07/18/2016 5:11 PM   DOB: 22-Jan-1981 / MRN: [id number]  SUBJECTIVE:  Jordan Ibarra is a 35 y.o. female presenting for a week of symptoms that include head pressure, nasal congestion, fever and myalgia. Reports "I feel like I've been hit by a truck."  She associates cough which is dry. She denies chest pain, asthma, and does smoke about 1-3 cigarettes daily.  She has been taking Nyquil and this is not helping.    She is allergic to latex.   She  has a past medical history of GERD (gastroesophageal reflux disease).    She  reports that she has been smoking Cigarettes.  She has a 6.60 pack-year smoking history. She has never used smokeless tobacco. She reports that she drinks alcohol. She reports that she does not use drugs. She  has no sexual activity history on file. The patient  has a past surgical history that includes Appendectomy; Abdominal hysterectomy; Tonsillectomy; and Anterior cervical decomp/discectomy fusion (N/A, 01/22/2016).  Her family history is not on file.  Review of Systems  Constitutional: Negative for fever.  Respiratory: Positive for cough. Negative for hemoptysis, sputum production, shortness of breath and wheezing.   Cardiovascular: Negative for chest pain.  Gastrointestinal: Negative for vomiting.  Neurological: Negative for dizziness.    The problem list and medications were reviewed and updated by myself where necessary and exist elsewhere in the encounter.   OBJECTIVE:  BP 128/82 (BP Location: Right Arm, Patient Position: Sitting, Cuff Size: Normal)   Pulse 85   Temp 97.8 F (36.6 C) (Oral)   Resp 17   Ht 5' 6.5" (1.689 m)   Wt 185 lb (83.9 kg)   SpO2 97%   BMI 29.41 kg/m   Physical Exam  Constitutional: She is oriented to person, place, and time. She appears well-developed and well-nourished. No distress.  Cardiovascular: Normal rate and regular rhythm.   No murmur heard. Pulmonary/Chest: Effort normal and breath sounds normal. No respiratory  distress. She has no wheezes. She has no rales. She exhibits no tenderness.  Abdominal: Soft. Bowel sounds are normal.  Musculoskeletal: Normal range of motion.  Neurological: She is alert and oriented to person, place, and time.  Skin: Skin is warm and dry. She is not diaphoretic.  Psychiatric: She has a normal mood and affect.    Results for orders placed or performed in visit on 07/18/16 (from the past 72 hour(s))  POCT Influenza A/B     Status: None   Collection Time: 07/18/16  4:16 PM  Result Value Ref Range   Influenza A, POC Negative Negative   Influenza B, POC Negative Negative  POCT CBC     Status: Abnormal   Collection Time: 07/18/16  4:37 PM  Result Value Ref Range   WBC 8.8 4.6 - 10.2 K/uL   Lymph, poc 3.1 0.6 - 3.4   POC LYMPH PERCENT 34.8 10 - 50 %L   MID (cbc) 0.6 0 - 0.9   POC MID % 6.9 0 - 12 %M   POC Granulocyte 5.1 2 - 6.9   Granulocyte percent 58.3 37 - 80 %G   RBC 4.88 4.04 - 5.48 M/uL   Hemoglobin 14.4 12.2 - 16.2 g/dL   HCT, POC 08.640.1 57.837.7 - 47.9 %   MCV 82.1 80 - 97 fL   MCH, POC 29.6 27 - 31.2 pg   MCHC 36.0 (A) 31.8 - 35.4 g/dL   RDW, POC 46.912.7 %   Platelet Count, POC 239 142 - 424  K/uL   MPV 8.5 0 - 99.8 fL    Dg Chest 2 View  Result Date: 07/18/2016 CLINICAL DATA:  Cough, fever and chills.  Symptoms for 6 days. EXAM: CHEST  2 VIEW COMPARISON:  06/06/2016 FINDINGS: The heart size and mediastinal contours are within normal limits. Both lungs are clear. No pleural effusion or pneumothorax. The visualized skeletal structures are unremarkable. IMPRESSION: No active cardiopulmonary disease. Electronically Signed   By: Amie Portlandavid  Ormond M.D.   On: 07/18/2016 16:57    ASSESSMENT AND PLAN  Jordan Ibarra was seen today for sinusitis, nasal congestion and sore throat.  Diagnoses and all orders for this visit:  Influenza-like illness:  -     POCT Influenza A/B  Cough: Flu negative.  CBC and rads reassuring.  Will cover for an atypical with zpack.  Cough syrup for  symptom management.  -     POCT CBC -     DG Chest 2 View; Future -     azithromycin (ZITHROMAX) 250 MG tablet; Take two and day on and one daily thereafter. Please wait until day ten of total illness before filling. -     HYDROcodone-homatropine (HYCODAN) 5-1.5 MG/5ML syrup; Take 5 mLs by mouth 2 (two) times daily.    The patient is advised to call or return to clinic if she does not see an improvement in symptoms, or to seek the care of the closest emergency department if she worsens with the above plan.   Deliah BostonMichael Isra Lindy, MHS, PA-C [facility] 07/18/2016 5:11 PM

## 2016-10-21 ENCOUNTER — Encounter (HOSPITAL_COMMUNITY): Payer: Self-pay

## 2016-10-21 ENCOUNTER — Emergency Department (HOSPITAL_COMMUNITY)
Admission: EM | Admit: 2016-10-21 | Discharge: 2016-10-21 | Disposition: A | Discharge status: Home / Self Care | Payer: BLUE CROSS/BLUE SHIELD | Attending: Emergency Medicine | Admitting: Emergency Medicine

## 2016-10-21 DIAGNOSIS — R21 Rash and other nonspecific skin eruption: Secondary | ICD-10-CM

## 2016-10-21 DIAGNOSIS — L299 Pruritus, unspecified: Secondary | ICD-10-CM | POA: Insufficient documentation

## 2016-10-21 DIAGNOSIS — Z91040 Latex allergy status: Secondary | ICD-10-CM | POA: Insufficient documentation

## 2016-10-21 DIAGNOSIS — F17210 Nicotine dependence, cigarettes, uncomplicated: Secondary | ICD-10-CM | POA: Insufficient documentation

## 2016-10-21 MED ORDER — CLOTRIMAZOLE-BETAMETHASONE 1-0.05 % EX CREA: TOPICAL_CREAM | CUTANEOUS | 0 refills | Status: DC

## 2016-10-21 NOTE — ED Triage Notes (Signed)
Pt states that she is itching and burning on the right side where her underwear line is, she states that she bought new underwear yesterday and that's when it started

## 2016-10-21 NOTE — ED Provider Notes (Signed)
[facility]-EMERGENCY DEPT Provider Note   CSN: [id number] Arrival date & time: 10/21/16  0450     History   Chief Complaint Chief Complaint  Patient presents with  . Groin Pain    HPI Jordan Ibarra is a 36 y.o. female.  The history is provided by the patient and medical records. No language interpreter was used.  Groin Pain    Jordan Ibarra is a 36 y.o. female  who presents to the Emergency Department complaining of pruritic rash to the right groin which she noticed starting last night. Also endorses intermittent "burning" pain.  She notes that she bought a new pair of underwear yesterday, washed them, and wore them for the first time. She believes this might be contributing to the rash. No rash anywhere else except one patch to the right groin. No vaginal discharge. No fevers/chills. Took a benadryl earlier with very little relief in itching. No history of similar sxs.   Past Medical History:  Diagnosis Date  . GERD (gastroesophageal reflux disease)    otc  Tums    Patient Active Problem List   Diagnosis Date Noted  . Cervical radiculopathy 01/22/2016  . Bilateral hand pain 11/15/2015  . CAP (community acquired pneumonia) 11/15/2015  . Chest wall pain 04/17/2015    Past Surgical History:  Procedure Laterality Date  . ABDOMINAL HYSTERECTOMY    . ANTERIOR CERVICAL DECOMP/DISCECTOMY FUSION N/A 01/22/2016   Procedure: Cervical six - seven Anterior cervical discectomy with fusion and plate fixation;  Surgeon: Loura HaltBenjamin Jared Ditty, MD;  Location: [facility];  Service: Neurosurgery;  Laterality: N/A;  C6-7 Anterior cervical discectomy with fusion and plate fixation  . APPENDECTOMY    . TONSILLECTOMY      OB History    No data available       Home Medications    Prior to Admission medications   Medication Sig Start Date End Date Taking? Authorizing Provider  azithromycin (ZITHROMAX) 250 MG tablet Take two and day on and one daily thereafter. Please wait until day  ten of total illness before filling. 07/18/16   Ofilia NeasMichael L Clark, PA-C  clotrimazole-betamethasone (LOTRISONE) cream Apply to affected area 2 times daily prn 10/21/16   [facility] Center[facility] Pil[facility] Ward, PA-C  cyclobenzaprine (FLEXERIL) 5 MG tablet Take 5 mg by mouth at bedtime.    Historical Provider, MD  gabapentin (NEURONTIN) 300 MG capsule Take 1 capsule (300 mg total) by mouth 3 (three) times daily. Patient taking differently: Take 300 mg by mouth at bedtime.  01/22/16   Loura HaltBenjamin Jared Ditty, MD  HYDROcodone-homatropine [facility](HYCODAN) 5-1.5 MG/5ML syrup Take 5 mLs by mouth 2 (two) times daily. 07/18/16   Ofilia NeasMichael L Clark, PA-C  methocarbamol (ROBAXIN-750) 750 MG tablet Take 1 tablet (750 mg total) by mouth 4 (four) times daily. Patient taking differently: Take 750 mg by mouth 2 (two) times daily.  01/22/16   Truitt MerleBenjamin Jared Ditty, MD    Family History History reviewed. No pertinent family history.  Social History Social History  Substance Use Topics  . Smoking status: Current Every Day Smoker    Packs/day: 0.30    Years: 22.00    Types: Cigarettes  . Smokeless tobacco: Never Used  . Alcohol use Yes     Comment: "Not that often"     Allergies   Latex   Review of Systems Review of Systems  Constitutional: Negative for chills and fever.  Genitourinary: Negative for dysuria and vaginal discharge.  Skin: Positive for rash.  Physical Exam Updated Vital Signs BP 114/74 (BP Location: Left Arm)   Pulse 76   Temp 97.6 F (36.4 C) (Oral)   Resp 20   SpO2 100%   Physical Exam  Constitutional: She is oriented to person, place, and time. She appears well-developed and well-nourished. No distress.  HENT:  Head: Normocephalic and atraumatic.  Neck: Neck supple.  Cardiovascular: Normal rate, regular rhythm and normal heart sounds.   No murmur heard. Pulmonary/Chest: Effort normal and breath sounds normal. No respiratory distress.  Musculoskeletal: She exhibits no edema.  Neurological: She is  alert and oriented to person, place, and time.  Skin: Skin is warm and dry. Rash (Erythematous, pruritic rash to right groin) noted.  Nursing note and vitals reviewed.    ED Treatments / Results  Labs (all labs ordered are listed, but only abnormal results are displayed) Labs Reviewed - No data to display  EKG  EKG Interpretation None       Radiology No results found.  Procedures Procedures (including critical care time)  Medications Ordered in ED Medications - No data to display   Initial Impression / Assessment and Plan / ED Course  I have reviewed the triage vital signs and the nursing notes.  Pertinent labs & imaging results that were available during my care of the patient were reviewed by me and considered in my medical decision making (see chart for details).    Jordan Ibarra is a 36 y.o. female who presents to ED for rash to the right groin. Patient seems to believe that rash was caused by wearing a new pair of underwear yesterday. This is possible, however I would expect rash to not be localized to one small area of the groin. Exam seems more c/w fungal infection. Will treat with Lotrisone cream. Follow up with PCP if symptoms worsen/persist. Reasons to return to ER discussed and all questions answered.   Final Clinical Impressions(s) / ED Diagnoses   Final diagnoses:  Rash    New Prescriptions New Prescriptions   CLOTRIMAZOLE-BETAMETHASONE (LOTRISONE) CREAM    Apply to affected area 2 times daily prn     [facility] Ward, PA-C 10/21/16 9604    Gilda Crease, MD 10/21/16 [phone]

## 2016-10-21 NOTE — Discharge Instructions (Signed)
Apply cream to affected area as directed. Keep area clean and dry. Follow up with your primary care provider for recheck of symptoms. Return to ER for new or worsening symptoms, any additional concerns.

## 2016-11-11 IMAGING — CR DG CHEST 2V
2 series · 2 of 2 positions shown · non-contrast
Comparison: 09/06/2015

CLINICAL DATA: Cough, congestion, and shortness of breath for 3
weeks, diagnosed with bronchitis on 09/06/2015, no improvement
following Zajk, smoker

EXAM:
CHEST  2 VIEW

[w chest pa]
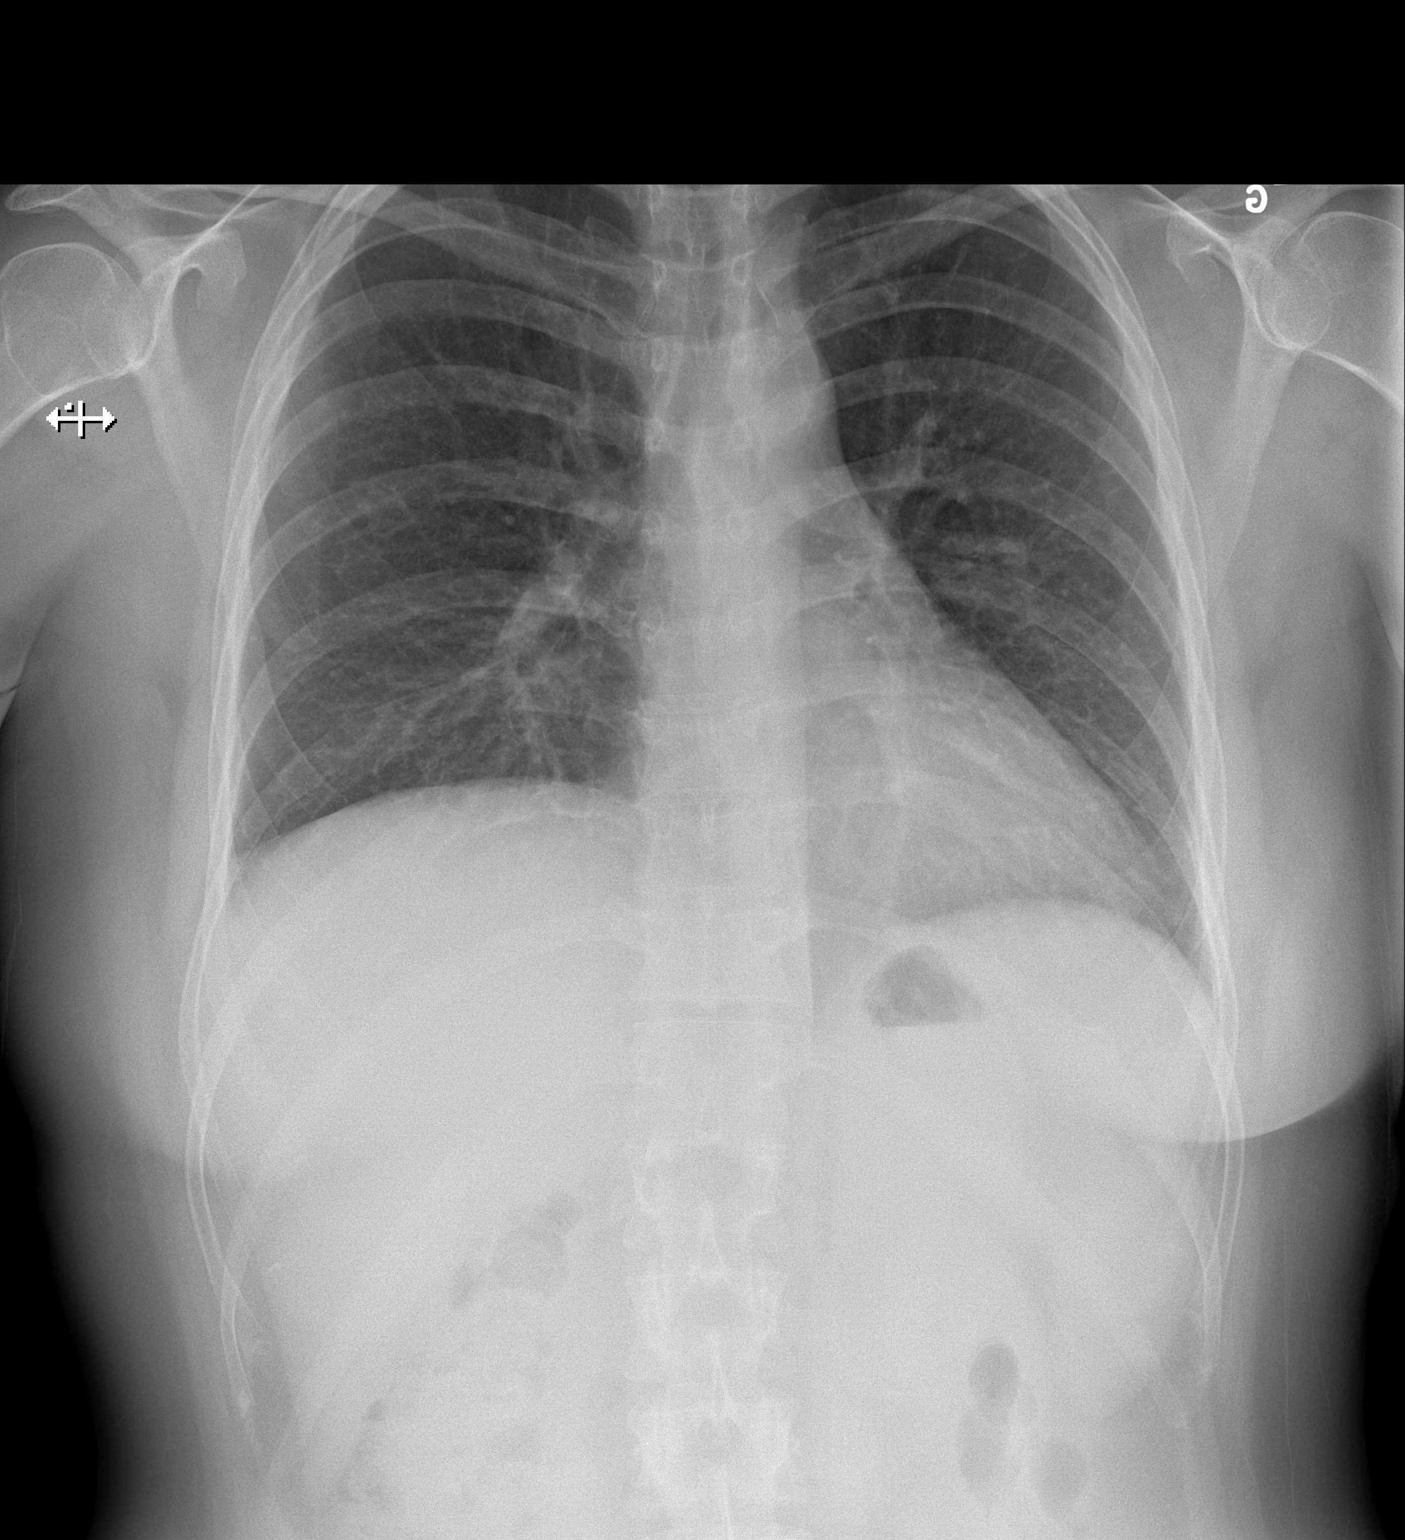

[w chest lat]
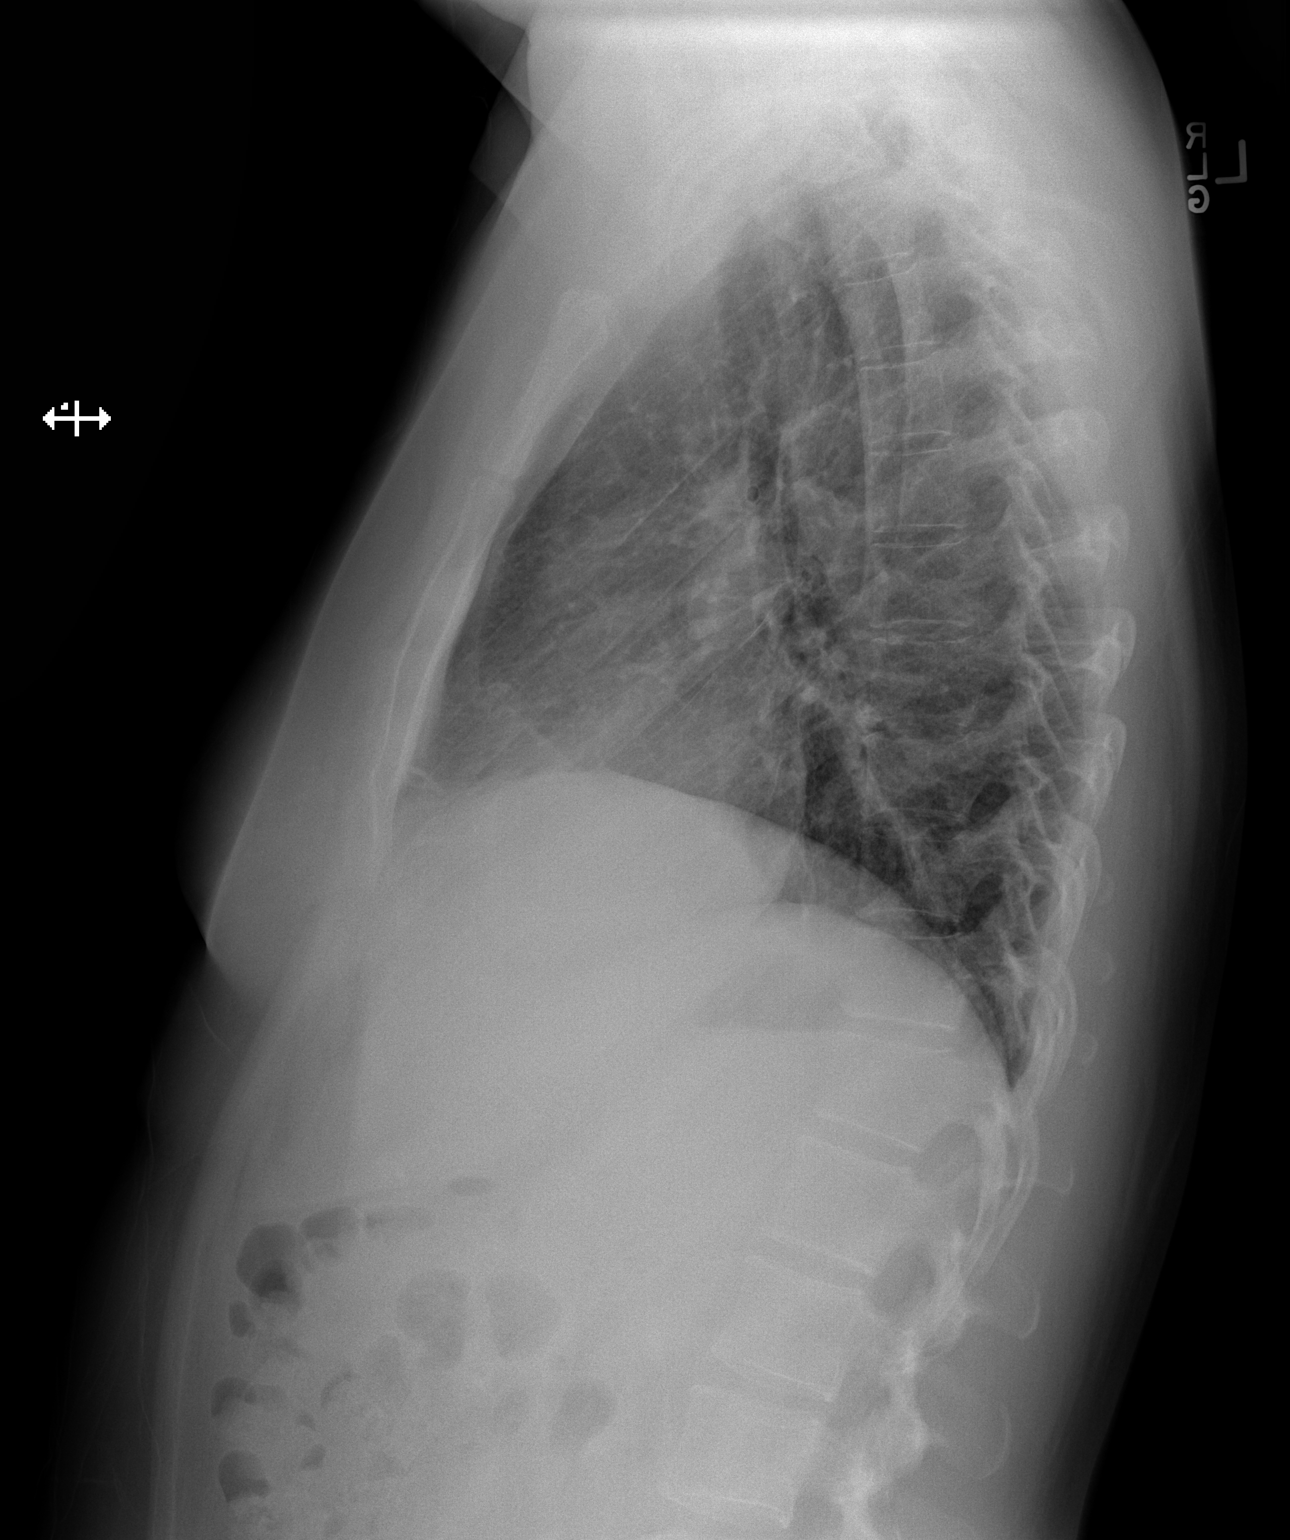

[2 of 2 positions shown; findings below may reference images not displayed]

FINDINGS: Upper normal heart size.

Mediastinal contours and pulmonary vascularity normal.

Lungs clear.

No pleural effusion or pneumothorax.

Bones unremarkable.
IMPRESSION: No acute abnormalities.

## 2017-12-18 ENCOUNTER — Encounter (HOSPITAL_COMMUNITY): Payer: Self-pay | Admitting: Emergency Medicine

## 2017-12-18 ENCOUNTER — Emergency Department (HOSPITAL_COMMUNITY)
Admission: EM | Admit: 2017-12-18 | Discharge: 2017-12-18 | Disposition: A | Discharge status: Home / Self Care | Payer: BLUE CROSS/BLUE SHIELD | Attending: Emergency Medicine | Admitting: Emergency Medicine

## 2017-12-18 DIAGNOSIS — Z79899 Other long term (current) drug therapy: Secondary | ICD-10-CM | POA: Insufficient documentation

## 2017-12-18 DIAGNOSIS — M5441 Lumbago with sciatica, right side: Secondary | ICD-10-CM | POA: Insufficient documentation

## 2017-12-18 DIAGNOSIS — Z91040 Latex allergy status: Secondary | ICD-10-CM | POA: Insufficient documentation

## 2017-12-18 DIAGNOSIS — F17210 Nicotine dependence, cigarettes, uncomplicated: Secondary | ICD-10-CM | POA: Insufficient documentation

## 2017-12-18 MED ORDER — PREDNISONE 10 MG (21) PO TBPK: ORAL_TABLET | Freq: Every day | ORAL | 0 refills | Status: DC

## 2017-12-18 MED ORDER — CYCLOBENZAPRINE HCL 10 MG PO TABS: 10.0000 mg | ORAL_TABLET | Freq: Two times a day (BID) | ORAL | 0 refills | Status: DC | PRN

## 2017-12-18 NOTE — ED Triage Notes (Signed)
Pt c/o lower mid back pain since Sunday. Reports that last night when she went to stand up off couch both her legs were numb and tingling. Patient ambulatory to triage from lobby.

## 2017-12-18 NOTE — ED Provider Notes (Signed)
[facility]-EMERGENCY DEPT Provider Note   CSN: [id number] Arrival date & time: 12/18/17  1012     History   Chief Complaint Chief Complaint  Patient presents with  . Back Pain  . legs tingling    HPI Jordan Ibarra is a 37 y.o. female with history of GERD, cervical radiculopathy status post anterior cervical discectomy and fusion presents today for evaluation of acute onset, progressively worsening low back pain for 6 days.  She states that on Sunday evening she noticed pain to her lower back with radiation to the right side but states she thought nothing of it.  She states the pain worsened on Monday morning when she awoke.  Last night she states that when she got up from a seated position to a standing position and attempted to ambulate she felt both legs "go numb and tingling for 5 minutes ".  The symptoms resolved with rest.  She states that her pain is constant, burning, aching, at times stabbing.  Pain worsens with ambulation for prolonged periods of time and improves mildly with rest and laying.  She has tried applying BenGay and taking Tylenol with mild temporary relief of her symptoms.  She denies fevers, chills, IV drug use, bowel or bladder incontinence, saddle anesthesia, or history of cancer.  She has a history of cervical spine disease which required fusion and she last saw her neurosurgeon 1 year ago.  [facility] for her symptoms but left due to long wait time.  Chart review shows that the x-ray that she underwent showed no acute abnormalities.  She denies any recent trauma or falls or excessive bending, lifting, or twisting injury.  The history is provided by the patient.    Past Medical History:  Diagnosis Date  . GERD (gastroesophageal reflux disease)    otc  Tums    Patient Active Problem List   Diagnosis Date Noted  . Cervical radiculopathy 01/22/2016  . Bilateral hand pain 11/15/2015  . CAP  (community acquired pneumonia) 11/15/2015  . Chest wall pain 04/17/2015    Past Surgical History:  Procedure Laterality Date  . ABDOMINAL HYSTERECTOMY    . ANTERIOR CERVICAL DECOMP/DISCECTOMY FUSION N/A 01/22/2016   Procedure: Cervical six - seven Anterior cervical discectomy with fusion and plate fixation;  Surgeon: Loura Halt Ditty, MD;  Location: [facility];  Service: Neurosurgery;  Laterality: N/A;  C6-7 Anterior cervical discectomy with fusion and plate fixation  . APPENDECTOMY    . TONSILLECTOMY       OB History   None      Home Medications    Prior to Admission medications   Medication Sig Start Date End Date Taking? Authorizing Provider  azithromycin (ZITHROMAX) 250 MG tablet Take two and day on and one daily thereafter. Please wait until day ten of total illness before filling. 07/18/16   Ofilia Neas, PA-C  clotrimazole-betamethasone (LOTRISONE) cream Apply to affected area 2 times daily prn 10/21/16   Ward, Chase Picket, PA-C  cyclobenzaprine (FLEXERIL) 10 MG tablet Take 1 tablet (10 mg total) by mouth 2 (two) times daily as needed for muscle spasms. 12/18/17   Bookert Guzzi A, PA-C  gabapentin (NEURONTIN) 300 MG capsule Take 1 capsule (300 mg total) by mouth 3 (three) times daily. Patient taking differently: Take 300 mg by mouth at bedtime.  01/22/16   Ditty, Loura Halt, MD  HYDROcodone-homatropine [facility]) 5-1.5 MG/5ML syrup Take 5 mLs by mouth 2 (two)  times daily. 07/18/16   Ofilia Neaslark, Michael L, PA-C  methocarbamol (ROBAXIN-750) 750 MG tablet Take 1 tablet (750 mg total) by mouth 4 (four) times daily. Patient taking differently: Take 750 mg by mouth 2 (two) times daily.  01/22/16   Ditty, Loura HaltBenjamin Jared, MD  predniSONE (STERAPRED UNI-PAK 21 TAB) 10 MG (21) TBPK tablet Take by mouth daily. Take 6 tabs by mouth daily  for 2 days, then 5 tabs for 2 days, then 4 tabs for 2 days, then 3 tabs for 2 days, 2 tabs for 2 days, then 1 tab by mouth daily for 2 days 12/18/17    Jeanie SewerFawze, Jaquetta Currier A, PA-C    Family History No family history on file.  Social History Social History   Tobacco Use  . Smoking status: Current Every Day Smoker    Packs/day: 0.30    Years: 22.00    Pack years: 6.60    Types: Cigarettes  . Smokeless tobacco: Never Used  Substance Use Topics  . Alcohol use: Yes    Comment: "Not that often"  . Drug use: No    Comment: none in 10 years     Allergies   Latex   Review of Systems Review of Systems  Constitutional: Negative for chills and fever.  Genitourinary: Negative for dysuria, frequency, hematuria and urgency.  Musculoskeletal: Positive for back pain.  Neurological: Positive for numbness (Resolved). Negative for syncope, weakness and headaches.     Physical Exam Updated Vital Signs BP 105/71 (BP Location: Left Arm)   Pulse 74   Temp 98 F (36.7 C) (Oral)   Resp 18   Ht 5\' 5"  (1.651 m)   Wt 81.2 kg (179 lb)   SpO2 97%   BMI 29.79 kg/m   Physical Exam  Constitutional: She appears well-developed and well-nourished. No distress.  HENT:  Head: Normocephalic and atraumatic.  Eyes: Conjunctivae are normal. Right eye exhibits no discharge. Left eye exhibits no discharge.  Neck: No JVD present. No tracheal deviation present.  Cardiovascular: Normal rate and intact distal pulses.  2+ DP/PT pulses bilaterally, no lower extremity edema, palpable cords, or erythema.  Pulmonary/Chest: Effort normal.  Abdominal: She exhibits no distension.  Musculoskeletal: She exhibits tenderness. She exhibits no edema.  She has tenderness to palpation at around the level of L4 through S1 with associated right sided paraspinal muscle tenderness.  No deformity, crepitus, or step-off noted.  5/5 strength of BLE major muscle groups.  Pain worsens with flexion of the lumbar spine but not lateral rotation or extension. Positive straight leg raise on the right.  Neurological: She is alert.  Fluent speech, no facial droop, sensation intact to soft  touch of bilateral lower extremities, mildly antalgic gait, but patient able to heel walk and toe walk without difficulty.   Skin: Skin is warm and dry. No erythema.  Psychiatric: She has a normal mood and affect. Her behavior is normal.  Nursing note and vitals reviewed.    ED Treatments / Results  Labs (all labs ordered are listed, but only abnormal results are displayed) Labs Reviewed - No data to display  EKG None  Radiology No results found.  Procedures Procedures (including critical care time)  Medications Ordered in ED Medications - No data to display   Initial Impression / Assessment and Plan / ED Course  I have reviewed the triage vital signs and the nursing notes.  Pertinent labs & imaging results that were available during my care of the patient were reviewed by me  and considered in my medical decision making (see chart for details).     Patient with back pain.  Afebrile, vital signs are stable.  She is nontoxic in appearance.  No neurological deficits and normal neuro exam.  Patient can walk but states is painful.  No loss of bowel or bladder control.  No concern for cauda equina.  No fever, night sweats, weight loss, h/o cancer, IVDU.  No concern for dissection, EBV.  RICE protocol and pain medicine indicated and discussed with patient.  Will discharge with Flexeril and steroid pack.  She has a Midwife with whom she can follow-up with.  Discussed strict ED return precautions. Pt verbalized understanding of and agreement with plan and is safe for discharge home at this time.   Final Clinical Impressions(s) / ED Diagnoses   Final diagnoses:  Acute right-sided low back pain with right-sided sciatica    ED Discharge Orders        Ordered    cyclobenzaprine (FLEXERIL) 10 MG tablet  2 times daily PRN     12/18/17 1234    predniSONE (STERAPRED UNI-PAK 21 TAB) 10 MG (21) TBPK tablet  Daily     12/18/17 1234       Jeanie Sewer, PA-C 12/18/17 1236      Pricilla Loveless, MD 12/18/17 1609

## 2017-12-18 NOTE — Discharge Instructions (Signed)
Start taking prednisone pack as prescribed.  Do not take ibuprofen, Advil, Motrin, or Aleve with this medication.  You may take [phone] mg of Tylenol every 6 hours in addition to the prednisone for pain.  Do not exceed 4000 mg of Tylenol daily. You may take Flexeril up to twice daily as needed for muscle spasms. This medication may make you drowsy, so I typically only recommended at night. If this medication makes you drowsy throughout the day, no driving, drinking alcohol, or operating heavy machinery. You may also cut these tablets in half. Apply ice or heat to the areas of soreness, 20 minutes on, 20 minutes off. Do some gentle stretching throughout the day, especially during hot showers or baths. Take short frequent walks and avoid prolonged periods of sitting or laying.  Follow-up with your neurosurgeon or your primary care physician for reevaluation of your symptoms within 1 week.  Return to ER for emergent changing or worsening of symptoms such as altered mental status/behaving unusually, persistent numbness to the arms or legs, unsteady gait, bowel or bladder incontinence.

## 2018-01-03 ENCOUNTER — Other Ambulatory Visit: Payer: Self-pay

## 2018-01-03 ENCOUNTER — Encounter (HOSPITAL_COMMUNITY): Payer: Self-pay | Admitting: Emergency Medicine

## 2018-01-03 ENCOUNTER — Emergency Department (HOSPITAL_COMMUNITY)
Admission: EM | Admit: 2018-01-03 | Discharge: 2018-01-03 | Disposition: A | Discharge status: Home / Self Care | Payer: BLUE CROSS/BLUE SHIELD | Attending: Emergency Medicine | Admitting: Emergency Medicine

## 2018-01-03 DIAGNOSIS — R300 Dysuria: Secondary | ICD-10-CM | POA: Insufficient documentation

## 2018-01-03 DIAGNOSIS — R102 Pelvic and perineal pain: Secondary | ICD-10-CM | POA: Insufficient documentation

## 2018-01-03 DIAGNOSIS — F17210 Nicotine dependence, cigarettes, uncomplicated: Secondary | ICD-10-CM | POA: Insufficient documentation

## 2018-01-03 LAB — URINALYSIS, ROUTINE W REFLEX MICROSCOPIC
Bilirubin Urine: NEGATIVE
Glucose, UA: NEGATIVE mg/dL
Hgb urine dipstick: NEGATIVE
Ketones, ur: NEGATIVE mg/dL
Leukocytes, UA: NEGATIVE
Nitrite: NEGATIVE
Protein, ur: NEGATIVE mg/dL
Specific Gravity, Urine: 1.024 (ref 1.005–1.030)
pH: 6 (ref 5.0–8.0)

## 2018-01-03 LAB — POC URINE PREG, ED: Preg Test, Ur: NEGATIVE

## 2018-01-03 NOTE — ED Triage Notes (Signed)
Pt reports that this morning when she woke up felt bladder spasm/pulse like feeling. Reports dysuria and strong foul odor when urinating.

## 2018-01-03 NOTE — ED Provider Notes (Signed)
[facility]-EMERGENCY DEPT Provider Note   CSN: [id number] Arrival date & time: 01/03/18  1850     History   Chief Complaint Chief Complaint  Patient presents with  . bladder spasm  . Dysuria    HPI Jordan Ibarra is a 37 y.o. female.  The history is provided by the patient and medical records. No language interpreter was used.   Jordan Ibarra is a 37 y.o. female  with a PMH of prior urethral sling repair in August 2018 who presents to the Emergency Department complaining of sharp pain to the urethral area after finishing voiding.  This started this morning and has been intermittent off and on throughout the day.  She also endorses a sensation she describes as possible bladder spasms which she further characterizes as her "bladder vibrating".  She denies any true abdominal pain.  She is felt hot, but has no objective fever.  No chills.  No new back pain.  No vaginal discharge.  She is sexually active with her partner of 9 years and denies any concerns for STDs.  Past Medical History:  Diagnosis Date  . GERD (gastroesophageal reflux disease)    otc  Tums    Patient Active Problem List   Diagnosis Date Noted  . Cervical radiculopathy 01/22/2016  . Bilateral hand pain 11/15/2015  . CAP (community acquired pneumonia) 11/15/2015  . Chest wall pain 04/17/2015    Past Surgical History:  Procedure Laterality Date  . ABDOMINAL HYSTERECTOMY    . ANTERIOR CERVICAL DECOMP/DISCECTOMY FUSION N/A 01/22/2016   Procedure: Cervical six - seven Anterior cervical discectomy with fusion and plate fixation;  Surgeon: Loura Halt Ditty, MD;  Location: [facility];  Service: Neurosurgery;  Laterality: N/A;  C6-7 Anterior cervical discectomy with fusion and plate fixation  . APPENDECTOMY    . TONSILLECTOMY       OB History   None      Home Medications    Prior to Admission medications   Medication Sig Start Date End Date Taking? Authorizing Provider    acetaminophen (TYLENOL) 500 MG tablet Take 500 mg by mouth every 6 (six) hours as needed for mild pain, moderate pain or headache.   Yes [provider]  clotrimazole-betamethasone (LOTRISONE) cream Apply to affected area 2 times daily prn Patient not taking: Reported on 01/03/2018 10/21/16   Mccrae Speciale, Chase Picket, PA-C  cyclobenzaprine (FLEXERIL) 10 MG tablet Take 1 tablet (10 mg total) by mouth 2 (two) times daily as needed for muscle spasms. Patient not taking: Reported on 01/03/2018 12/18/17   Michela Pitcher A, PA-C  gabapentin (NEURONTIN) 300 MG capsule Take 1 capsule (300 mg total) by mouth 3 (three) times daily. Patient not taking: Reported on 01/03/2018 01/22/16   Ditty, Loura Halt, MD  predniSONE (STERAPRED UNI-PAK 21 TAB) 10 MG (21) TBPK tablet Take by mouth daily. Take 6 tabs by mouth daily  for 2 days, then 5 tabs for 2 days, then 4 tabs for 2 days, then 3 tabs for 2 days, 2 tabs for 2 days, then 1 tab by mouth daily for 2 days Patient not taking: Reported on 01/03/2018 12/18/17   Jeanie Sewer, PA-C    Family History No family history on file.  Social History Social History   Tobacco Use  . Smoking status: Current Every Day Smoker    Packs/day: 0.30    Years: 22.00    Pack years: 6.60    Types: Cigarettes  . Smokeless tobacco: Never  Used  Substance Use Topics  . Alcohol use: Yes    Comment: "Not that often"  . Drug use: No    Comment: none in 10 years     Allergies   Latex   Review of Systems Review of Systems  Genitourinary: Positive for dysuria. Negative for frequency, urgency, vaginal bleeding and vaginal discharge.  All other systems reviewed and are negative.    Physical Exam Updated Vital Signs BP 112/76 (BP Location: Left Arm)   Pulse 75   Temp 97.8 F (36.6 C) (Oral)   Resp 16   SpO2 98%   Physical Exam  Constitutional: She is oriented to person, place, and time. She appears well-developed and well-nourished. No distress.  Non-toxic  appearing.  HENT:  Head: Normocephalic and atraumatic.  Cardiovascular: Normal rate, regular rhythm and normal heart sounds.  No murmur heard. Pulmonary/Chest: Effort normal and breath sounds normal. No respiratory distress.  Abdominal: Soft. Bowel sounds are normal. She exhibits no distension.  Mild diffuse tenderness to lower abdomen without focality. No rebound or guarding. No CVA tenderness.  Musculoskeletal: She exhibits no edema.  Neurological: She is alert and oriented to person, place, and time.  Skin: Skin is warm and dry.  Nursing note and vitals reviewed.    ED Treatments / Results  Labs (all labs ordered are listed, but only abnormal results are displayed) Labs Reviewed  URINE CULTURE  URINALYSIS, ROUTINE W REFLEX MICROSCOPIC  POC URINE PREG, ED    EKG None  Radiology No results found.  Procedures Procedures (including critical care time)  Medications Ordered in ED Medications - No data to display   Initial Impression / Assessment and Plan / ED Course  I have reviewed the triage vital signs and the nursing notes.  Pertinent labs & imaging results that were available during my care of the patient were reviewed by me and considered in my medical decision making (see chart for details).    Jordan Ibarra is a 37 y.o. female who presents to ED for pain after voiding as well as suprapubic discomfort which she describes as her "bladder vibrating".  On exam, patient is afebrile, hemodynamically stable with reassuring abdominal exam.  She does have some mild tenderness diffusely across the lower abdomen, but no focal areas of tenderness.  No specific tenderness at McBurney's point.  No rebound or guarding.  UA obtained and negative without any signs of UTI. Evaluation does not show pathology that would require ongoing emergent intervention or inpatient treatment.  We will have her follow-up with her GYN.  Reasons to return to ER discussed and all questions  answered.  Patient discussed with Dr. Juleen China who agrees with treatment plan.   Final Clinical Impressions(s) / ED Diagnoses   Final diagnoses:  Suprapubic pressure    ED Discharge Orders    None       Pamelia Botto, Chase Picket, PA-C 01/03/18 2247    Raeford Razor, MD 01/04/18 [phone]

## 2018-01-03 NOTE — Discharge Instructions (Signed)
It was my pleasure taking care of you today!   Fortunately, your urine sample and exam were reassuring today.   Please call your GYN doctor in the morning to try to get an appointment sooner.   It is VERY important that you monitor your symptoms and return to the Emergency Department if you develop any of the following symptoms:  You have a fever.  You keep throwing up and can't keep fluids down. New or worsening symptoms, any additional concerns.

## 2018-01-05 LAB — URINE CULTURE: CULTURE: NO GROWTH

## 2018-02-05 ENCOUNTER — Encounter (HOSPITAL_COMMUNITY): Payer: Self-pay

## 2018-02-05 ENCOUNTER — Other Ambulatory Visit: Payer: Self-pay

## 2018-02-05 ENCOUNTER — Emergency Department (HOSPITAL_COMMUNITY)
Admission: EM | Admit: 2018-02-05 | Discharge: 2018-02-05 | Disposition: A | Discharge status: Home / Self Care | Payer: Self-pay | Attending: Emergency Medicine | Admitting: Emergency Medicine

## 2018-02-05 DIAGNOSIS — F17210 Nicotine dependence, cigarettes, uncomplicated: Secondary | ICD-10-CM | POA: Insufficient documentation

## 2018-02-05 DIAGNOSIS — R1030 Lower abdominal pain, unspecified: Secondary | ICD-10-CM

## 2018-02-05 DIAGNOSIS — Z91040 Latex allergy status: Secondary | ICD-10-CM | POA: Insufficient documentation

## 2018-02-05 LAB — COMPREHENSIVE METABOLIC PANEL
ALBUMIN: 4.3 g/dL (ref 3.5–5.0)
ALT: 13 U/L — ABNORMAL LOW (ref 14–54)
ANION GAP: 8 (ref 5–15)
AST: 15 U/L (ref 15–41)
Alkaline Phosphatase: 80 U/L (ref 38–126)
BILIRUBIN TOTAL: 0.4 mg/dL (ref 0.3–1.2)
BUN: 16 mg/dL (ref 6–20)
CHLORIDE: 106 mmol/L (ref 101–111)
CO2: 29 mmol/L (ref 22–32)
Calcium: 9.5 mg/dL (ref 8.9–10.3)
Creatinine, Ser: 0.79 mg/dL (ref 0.44–1.00)
GFR calc Af Amer: >60 mL/min (ref >60)
Glucose, Bld: 94 mg/dL (ref 65–99)
Potassium: 4.1 mmol/L (ref 3.5–5.1)
Sodium: 143 mmol/L (ref 135–145)
TOTAL PROTEIN: 7.2 g/dL (ref 6.5–8.1)

## 2018-02-05 LAB — URINALYSIS, ROUTINE W REFLEX MICROSCOPIC
Bilirubin Urine: NEGATIVE
GLUCOSE, UA: NEGATIVE mg/dL
Hgb urine dipstick: NEGATIVE
Ketones, ur: NEGATIVE mg/dL
LEUKOCYTES UA: NEGATIVE
NITRITE: NEGATIVE
PH: 5 (ref 5.0–8.0)
Protein, ur: NEGATIVE mg/dL
Specific Gravity, Urine: 1.021 (ref 1.005–1.030)

## 2018-02-05 LAB — CBC
HEMATOCRIT: 42.2 % (ref 36.0–46.0)
Hemoglobin: 14 g/dL (ref 12.0–15.0)
MCH: 28.7 pg (ref 26.0–34.0)
MCHC: 33.2 g/dL (ref 30.0–36.0)
MCV: 86.7 fL (ref 78.0–100.0)
Platelets: 273 10*3/uL (ref 150–400)
RBC: 4.87 MIL/uL (ref 3.87–5.11)
RDW: 12.9 % (ref 11.5–15.5)
WBC: 8.8 10*3/uL (ref 4.0–10.5)

## 2018-02-05 LAB — LIPASE, BLOOD: LIPASE: 27 U/L (ref 11–51)

## 2018-02-05 MED ORDER — DICYCLOMINE HCL 20 MG PO TABS: 20.0000 mg | ORAL_TABLET | Freq: Two times a day (BID) | ORAL | 0 refills | Status: DC

## 2018-02-05 NOTE — ED Provider Notes (Signed)
[facility]-EMERGENCY DEPT Provider Note   CSN: [id number] Arrival date & time: 02/05/18  1352     History   Chief Complaint Chief Complaint  Patient presents with  . Abdominal Pain    HPI Jordan Ibarra is a 37 y.o. female.  37 year old female presents with intermittent lower abdominal pain which is been colicky in nature.  Patient denies any fever, chills, vomiting, diarrhea.  No vaginal bleeding or discharge.  She is status post hysterectomy.  Has had some suprapubic pain but denies any dysuria or hematuria.  Does have prior history of bladder surgery.  Pain resolved spontaneously no treatment used prior to arrival.  Nothing makes it worse.     Past Medical History:  Diagnosis Date  . GERD (gastroesophageal reflux disease)    otc  Tums    Patient Active Problem List   Diagnosis Date Noted  . Cervical radiculopathy 01/22/2016  . Bilateral hand pain 11/15/2015  . CAP (community acquired pneumonia) 11/15/2015  . Chest wall pain 04/17/2015    Past Surgical History:  Procedure Laterality Date  . ABDOMINAL HYSTERECTOMY    . ANTERIOR CERVICAL DECOMP/DISCECTOMY FUSION N/A 01/22/2016   Procedure: Cervical six - seven Anterior cervical discectomy with fusion and plate fixation;  Surgeon: Loura Halt Ditty, MD;  Location: [facility];  Service: Neurosurgery;  Laterality: N/A;  C6-7 Anterior cervical discectomy with fusion and plate fixation  . APPENDECTOMY    . bladder prolapse surgery    . TONSILLECTOMY       OB History   None      Home Medications    Prior to Admission medications   Medication Sig Start Date End Date Taking? Authorizing Provider  acetaminophen (TYLENOL) 500 MG tablet Take 500 mg by mouth every 6 (six) hours as needed for mild pain, moderate pain or headache.   Yes [provider]  clotrimazole-betamethasone (LOTRISONE) cream Apply to affected area 2 times daily prn Patient not taking: Reported on 01/03/2018  10/21/16   Ward, Chase Picket, PA-C  cyclobenzaprine (FLEXERIL) 10 MG tablet Take 1 tablet (10 mg total) by mouth 2 (two) times daily as needed for muscle spasms. Patient not taking: Reported on 01/03/2018 12/18/17   Michela Pitcher A, PA-C  gabapentin (NEURONTIN) 300 MG capsule Take 1 capsule (300 mg total) by mouth 3 (three) times daily. Patient not taking: Reported on 01/03/2018 01/22/16   Ditty, Loura Halt, MD  predniSONE (STERAPRED UNI-PAK 21 TAB) 10 MG (21) TBPK tablet Take by mouth daily. Take 6 tabs by mouth daily  for 2 days, then 5 tabs for 2 days, then 4 tabs for 2 days, then 3 tabs for 2 days, 2 tabs for 2 days, then 1 tab by mouth daily for 2 days Patient not taking: Reported on 01/03/2018 12/18/17   Jeanie Sewer, PA-C    Family History Family History  Problem Relation Age of Onset  . Kidney failure Mother   . Scoliosis Mother     Social History Social History   Tobacco Use  . Smoking status: Current Every Day Smoker    Packs/day: 0.15    Years: 22.00    Pack years: 3.30    Types: Cigarettes  . Smokeless tobacco: Never Used  Substance Use Topics  . Alcohol use: Yes    Comment: "Not that often"  . Drug use: No    Comment: none in 10 years     Allergies   Latex   Review of Systems Review  of Systems  All other systems reviewed and are negative.    Physical Exam Updated Vital Signs BP 106/73 (BP Location: Right Arm)   Pulse 78   Temp 97.8 F (36.6 C) (Oral)   Resp 15   Ht 1.651 m ( )   Wt 80.3 kg (177 lb)   SpO2 95%   BMI 29.45 kg/m   Physical Exam  Constitutional: She is oriented to person, place, and time. She appears well-developed and well-nourished.  Non-toxic appearance. No distress.  HENT:  Head: Normocephalic and atraumatic.  Eyes: Pupils are equal, round, and reactive to light. Conjunctivae, EOM and lids are normal.  Neck: Normal range of motion. Neck supple. No tracheal deviation present. No thyroid mass present.  Cardiovascular: Normal  rate, regular rhythm and normal heart sounds. Exam reveals no gallop.  No murmur heard. Pulmonary/Chest: Effort normal and breath sounds normal. No stridor. No respiratory distress. She has no decreased breath sounds. She has no wheezes. She has no rhonchi. She has no rales.  Abdominal: Soft. Normal appearance and bowel sounds are normal. She exhibits no distension. There is no tenderness. There is no rigidity, no rebound, no guarding and no CVA tenderness.    Musculoskeletal: Normal range of motion. She exhibits no edema or tenderness.  Neurological: She is alert and oriented to person, place, and time. She has normal strength. No cranial nerve deficit or sensory deficit. GCS eye subscore is 4. GCS verbal subscore is 5. GCS motor subscore is 6.  Skin: Skin is warm and dry. No abrasion and no rash noted.  Psychiatric: She has a normal mood and affect. Her speech is normal and behavior is normal.  Nursing note and vitals reviewed.    ED Treatments / Results  Labs (all labs ordered are listed, but only abnormal results are displayed) Labs Reviewed  COMPREHENSIVE METABOLIC PANEL - Abnormal; Notable for the following components:      Result Value   ALT 13 (*)    All other components within normal limits  LIPASE, BLOOD  CBC  URINALYSIS, ROUTINE W REFLEX MICROSCOPIC    EKG None  Radiology No results found.  Procedures Procedures (including critical care time)  Medications Ordered in ED Medications - No data to display   Initial Impression / Assessment and Plan / ED Course  I have reviewed the triage vital signs and the nursing notes.  Pertinent labs & imaging results that were available during my care of the patient were reviewed by me and considered in my medical decision making (see chart for details).     Patient with negative abdominal exam at this time.  Urinalysis is negative.  She is status post hysterectomy.  She has no vaginal discharge or bleeding.  Will not perform  pelvic at this time.  Her pain seems to wax and wane.  Return precautions given  Final Clinical Impressions(s) / ED Diagnoses   Final diagnoses:  None    ED Discharge Orders    None       Lorre Nick, MD 02/05/18 1815

## 2018-02-05 NOTE — ED Triage Notes (Signed)
Patient c/olwer abdominal pain x 2 days. Patient states the pain is worsening. Patient denies any N/V/D. Patient denies any vaginal discharge or dysuria.

## 2019-03-23 ENCOUNTER — Encounter (HOSPITAL_COMMUNITY): Payer: Self-pay

## 2019-03-23 ENCOUNTER — Emergency Department (HOSPITAL_COMMUNITY)
Admission: EM | Admit: 2019-03-23 | Discharge: 2019-03-24 | Disposition: A | Discharge status: Home / Self Care | Payer: Self-pay | Attending: Emergency Medicine | Admitting: Emergency Medicine

## 2019-03-23 ENCOUNTER — Other Ambulatory Visit: Payer: Self-pay

## 2019-03-23 DIAGNOSIS — R519 Headache, unspecified: Secondary | ICD-10-CM

## 2019-03-23 DIAGNOSIS — Z20828 Contact with and (suspected) exposure to other viral communicable diseases: Secondary | ICD-10-CM | POA: Insufficient documentation

## 2019-03-23 DIAGNOSIS — Z91040 Latex allergy status: Secondary | ICD-10-CM | POA: Insufficient documentation

## 2019-03-23 DIAGNOSIS — R51 Headache: Secondary | ICD-10-CM | POA: Insufficient documentation

## 2019-03-23 DIAGNOSIS — R110 Nausea: Secondary | ICD-10-CM | POA: Insufficient documentation

## 2019-03-23 DIAGNOSIS — F17210 Nicotine dependence, cigarettes, uncomplicated: Secondary | ICD-10-CM | POA: Insufficient documentation

## 2019-03-23 LAB — CBC WITH DIFFERENTIAL/PLATELET
Abs Immature Granulocytes: 0.02 10*3/uL (ref 0.00–0.07)
Basophils Absolute: 0.1 10*3/uL (ref 0.0–0.1)
Basophils Relative: 1 %
Eosinophils Absolute: 0.3 10*3/uL (ref 0.0–0.5)
Eosinophils Relative: 3 %
HCT: 42.3 % (ref 36.0–46.0)
Hemoglobin: 13.4 g/dL (ref 12.0–15.0)
Immature Granulocytes: 0 %
Lymphocytes Relative: 37 %
Lymphs Abs: 3.3 10*3/uL (ref 0.7–4.0)
MCH: 27.9 pg (ref 26.0–34.0)
MCHC: 31.7 g/dL (ref 30.0–36.0)
MCV: 88.1 fL (ref 80.0–100.0)
Monocytes Absolute: 0.7 10*3/uL (ref 0.1–1.0)
Monocytes Relative: 8 %
Neutro Abs: 4.7 10*3/uL (ref 1.7–7.7)
Neutrophils Relative %: 51 %
Platelets: 272 10*3/uL (ref 150–400)
RBC: 4.8 MIL/uL (ref 3.87–5.11)
RDW: 12.8 % (ref 11.5–15.5)
WBC: 9.1 10*3/uL (ref 4.0–10.5)
nRBC: 0 % (ref 0.0–0.2)

## 2019-03-23 LAB — BASIC METABOLIC PANEL
Anion gap: 8 (ref 5–15)
BUN: 19 mg/dL (ref 6–20)
CO2: 26 mmol/L (ref 22–32)
Calcium: 8.9 mg/dL (ref 8.9–10.3)
Chloride: 108 mmol/L (ref 98–111)
Creatinine, Ser: 0.8 mg/dL (ref 0.44–1.00)
GFR calc Af Amer: >60 mL/min (ref >60)
GFR calc non Af Amer: >60 mL/min (ref >60)
Glucose, Bld: 68 mg/dL — ABNORMAL LOW (ref 70–99)
Potassium: 3.5 mmol/L (ref 3.5–5.1)
Sodium: 142 mmol/L (ref 135–145)

## 2019-03-23 LAB — MAGNESIUM: Magnesium: 2.2 mg/dL (ref 1.7–2.4)

## 2019-03-23 MED ORDER — SODIUM CHLORIDE 0.9 % IV BOLUS
1000.0000 mL | Freq: Once | INTRAVENOUS | Status: AC
  Administered 2019-03-23: 23:00:00 1000 mL via INTRAVENOUS

## 2019-03-23 MED ORDER — METHYLPREDNISOLONE SODIUM SUCC 125 MG IJ SOLR
125.0000 mg | Freq: Once | INTRAMUSCULAR | Status: AC
Start: 2019-03-23 — End: 2019-03-23
  Administered 2019-03-23: 125 mg via INTRAVENOUS
  Filled 2019-03-23: qty 2

## 2019-03-23 MED ORDER — KETOROLAC TROMETHAMINE 30 MG/ML IJ SOLN
15.0000 mg | Freq: Once | INTRAMUSCULAR | Status: AC
  Administered 2019-03-23: 15 mg via INTRAVENOUS
  Filled 2019-03-23: qty 1

## 2019-03-23 MED ORDER — METOCLOPRAMIDE HCL 5 MG/ML IJ SOLN
10.0000 mg | Freq: Once | INTRAMUSCULAR | Status: AC
  Administered 2019-03-23: 22:00:00 10 mg via INTRAVENOUS
  Filled 2019-03-23: qty 2

## 2019-03-23 NOTE — ED Provider Notes (Signed)
[facility]-EMERGENCY DEPT Provider Note   CSN: [id number] Arrival date & time: 03/23/19  2021    History   Chief Complaint Chief Complaint  Patient presents with  . Headache    HPI Jordan Ibarra is a 38 y.o. female.     HPI   Jordan DomRebecca M Ibarra is a 38 y.o. female, with a history of GERD, presenting to the ED with headache beginning last night.  Pain is generalized, throbbing, gradual onset, worsened throughout the day today while she was at work, now 8/10.  Accompanied by nausea.  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 She has had similar headaches in the past, however, they usually resolve with 1 dose of Tylenol.  She took a dose of Tylenol at 1 PM today without resolution.  She has not tried any other therapies. Denies fever/chills, dizziness, syncope, neck/back pain, falls/trauma, vomiting, chest pain, shortness of breath, recent illness, numbness, weakness, vision deficits, or any other complaints.  Past Medical History:  Diagnosis Date  . GERD (gastroesophageal reflux disease)    otc  Tums    Patient Active Problem List   Diagnosis Date Noted  . Cervical radiculopathy 01/22/2016  . Bilateral hand pain 11/15/2015  . CAP (community acquired pneumonia) 11/15/2015  . Chest wall pain 04/17/2015    Past Surgical History:  Procedure Laterality Date  . ABDOMINAL HYSTERECTOMY    . ANTERIOR CERVICAL DECOMP/DISCECTOMY FUSION N/A 01/22/2016   Procedure: Cervical six - seven Anterior cervical discectomy with fusion and plate fixation;  Surgeon: Loura HaltBenjamin Jared Ditty, MD;  Location: [facility];  Service: Neurosurgery;  Laterality: N/A;  C6-7 Anterior cervical discectomy with fusion and plate fixation  . APPENDECTOMY    . bladder prolapse surgery    . TONSILLECTOMY       OB History   No obstetric history on file.      Home Medications    Prior to Admission medications   Medication Sig Start Date End Date  Taking? Authorizing Provider  acetaminophen (TYLENOL) 500 MG tablet Take 1,000 mg by mouth every 8 (eight) hours as needed for mild pain, moderate pain or headache.    Yes [provider]  clotrimazole-betamethasone (LOTRISONE) cream Apply to affected area 2 times daily prn Patient not taking: Reported on 01/03/2018 10/21/16   Ward, Chase PicketJaime Pilcher, PA-C  cyclobenzaprine (FLEXERIL) 10 MG tablet Take 1 tablet (10 mg total) by mouth 2 (two) times daily as needed for muscle spasms. Patient not taking: Reported on 01/03/2018 12/18/17   Michela PitcherFawze, Mina A, PA-C  dicyclomine (BENTYL) 20 MG tablet Take 1 tablet (20 mg total) by mouth 2 (two) times daily. Patient not taking: Reported on 03/23/2019 02/05/18   Lorre NickAllen, Anthony, MD  gabapentin (NEURONTIN) 300 MG capsule Take 1 capsule (300 mg total) by mouth 3 (three) times daily. Patient not taking: Reported on 01/03/2018 01/22/16   Ditty, Loura HaltBenjamin Jared, MD    Family History Family History  Problem Relation Age of Onset  . Kidney failure Mother   . Scoliosis Mother     Social History Social History   Tobacco Use  . Smoking status: Current Every Day Smoker    Packs/day: 0.15    Years: 22.00    Pack years: 3.30    Types: Cigarettes  . Smokeless tobacco: Never Used  Substance Use Topics  . Alcohol use: Yes    Comment: "Not that often"  . Drug use: No    Comment:  none in 10 years     Allergies   Latex   Review of Systems Review of Systems  Constitutional: Negative for chills and fever.  HENT: Negative for congestion, facial swelling, sinus pressure, sinus pain and sore throat.   Eyes: Negative for pain and visual disturbance.  Respiratory: Negative for shortness of breath.   Cardiovascular: Negative for chest pain.  Gastrointestinal: Positive for nausea. Negative for abdominal pain, diarrhea and vomiting.  Musculoskeletal: Negative for back pain and neck pain.  Neurological: Positive for headaches. Negative for dizziness, syncope,  weakness, light-headedness and numbness.  All other systems reviewed and are negative.    Physical Exam Updated Vital Signs BP 120/74 (BP Location: Left Arm)   Pulse 66   Temp 98 F (36.7 C) (Oral)   Resp 14   Ht 5\' 5"  (1.651 m)   SpO2 98%   BMI 29.45 kg/m   Physical Exam Vitals signs and nursing note reviewed.  Constitutional:      General: She is not in acute distress.    Appearance: She is well-developed. She is not diaphoretic.  HENT:     Head: Normocephalic and atraumatic.     Nose:     Right Sinus: No maxillary sinus tenderness or frontal sinus tenderness.     Left Sinus: No maxillary sinus tenderness or frontal sinus tenderness.     Mouth/Throat:     Mouth: Mucous membranes are moist.     Pharynx: Oropharynx is clear.  Eyes:     Extraocular Movements: Extraocular movements intact.     Conjunctiva/sclera: Conjunctivae normal.     Pupils: Pupils are equal, round, and reactive to light.  Neck:     Musculoskeletal: Neck supple.  Cardiovascular:     Rate and Rhythm: Normal rate and regular rhythm.     Pulses: Normal pulses.          Radial pulses are 2+ on the right side and 2+ on the left side.       Posterior tibial pulses are 2+ on the right side and 2+ on the left side.     Heart sounds: Normal heart sounds.     Comments: Tactile temperature in the extremities appropriate and equal bilaterally. Pulmonary:     Effort: Pulmonary effort is normal. No respiratory distress.     Breath sounds: Normal breath sounds.  Abdominal:     Palpations: Abdomen is soft.     Tenderness: There is no abdominal tenderness. There is no guarding.  Musculoskeletal:     Right lower leg: No edema.     Left lower leg: No edema.     Comments: Normal motor function intact in all extremities. No midline spinal tenderness.   Lymphadenopathy:     Cervical: No cervical adenopathy.  Skin:    General: Skin is warm and dry.  Neurological:     Mental Status: She is alert and oriented to  person, place, and time.     Comments: Sensation grossly intact to light touch in the extremities.  Grip strengths equal bilaterally.  Strength 5/5 in all extremities. No gait disturbance. Coordination intact. Cranial nerves III-XII grossly intact. No facial droop.   Psychiatric:        Mood and Affect: Mood and affect normal.        Speech: Speech normal.        Behavior: Behavior normal.      ED Treatments / Results  Labs (all labs ordered are listed, but only abnormal results are  displayed) Labs Reviewed  BASIC METABOLIC PANEL - Abnormal; Notable for the following components:      Result Value   Glucose, Bld 68 (*)    All other components within normal limits  NOVEL CORONAVIRUS, NAA (HOSPITAL ORDER, SEND-OUT TO REF LAB)  CBC WITH DIFFERENTIAL/PLATELET  MAGNESIUM    EKG None  Radiology No results found.  Procedures Procedures (including critical care time)  Medications Ordered in ED Medications  sodium chloride 0.9 % bolus 1,000 mL (0 mLs Intravenous Stopped 03/23/19 2342)  ketorolac (TORADOL) 30 MG/ML injection 15 mg (15 mg Intravenous Given 03/23/19 2225)  metoCLOPramide (REGLAN) injection 10 mg (10 mg Intravenous Given 03/23/19 2224)  methylPREDNISolone sodium succinate (SOLU-MEDROL) 125 mg/2 mL injection 125 mg (125 mg Intravenous Given 03/23/19 2225)     Initial Impression / Assessment and Plan / ED Course  I have reviewed the triage vital signs and the nursing notes.  Pertinent labs & imaging results that were available during my care of the patient were reviewed by me and considered in my medical decision making (see chart for details).  Clinical Course as of Mar 23 28  Wed Mar 23, 2019  2252 Patient reevaluated.  States she has had some improvement in her headache.  Her nausea has resolved.   [SJ]  Thu Mar 24, 2019  0002 Patient sleeping upon reevaluation.  States she feels much better and would like to go home.   [SJ]    Clinical Course User Index [SJ]  Joy, Shawn C, PA-C       Patient presents with headache that did not resolve with a single dose of Tylenol. Patient is nontoxic appearing, afebrile, not tachycardic, not tachypneic, not hypotensive, maintains excellent SPO2 on room air, and is in no apparent distress.  Lab results reassuring.  Low suspicion for intracranial hemorrhage or other neurosurgical emergency based on patient description of symptoms, gradual onset, and physical exam findings. Resolution in symptoms during ED course. The patient was given instructions for home care as well as return precautions. Patient voices understanding of these instructions, accepts the plan, and is comfortable with discharge.   Final Clinical Impressions(s) / ED Diagnoses   Final diagnoses:  Persistent headaches    ED Discharge Orders    None       Concepcion LivingJoy, Shawn C, PA-C 03/24/19 0030    Tegeler, Canary Brimhristopher J, MD 03/24/19 [phone]

## 2019-03-23 NOTE — ED Triage Notes (Signed)
Pt arrived stating she has a headache that started yesterday. Pt took tylenol at 1400 with no relief. No history of migraines. No vomiting but states she is nauseated.

## 2019-03-24 NOTE — Discharge Instructions (Addendum)
Headache Your lab results showed no acute abnormalities.  For future headaches please try the following regimen: Antiinflammatory medications: Take 600 mg of ibuprofen every 6 hours or 440 mg (over the counter dose) to 500 mg (prescription dose) of naproxen every 12 hours.  Use this regimen until headache subsides for up to 3 days .  After this time, these medications may be used as needed for pain. Take these medications with food to avoid upset stomach. Choose only one of these medications, do not take them together. Acetaminophen: Should you continue to have additional pain while taking the ibuprofen or naproxen, you may add in acetaminophen (generic for Tylenol) as needed. Your daily total maximum amount of acetaminophen from all sources should be limited to 4000mg /day for persons without liver problems, or 2000mg /day for those with liver problems.  Hydration: Have a goal of about a half liter of water every couple hours to stay well hydrated.   Sleep: Please be sure to get plenty of sleep with a goal of 8 hours per night. Having a regular bed time and bedtime routine can help with this.  Screens: Reduce the amount of time you are in front of screens.  Take about a 5-10-minute break every hour or every couple hours to give your eyes rest.  Do not use screens in dark rooms.  Glasses with a blue light filter may also help reduce eye fatigue.  Stress: Take steps to reduce stress as much as possible.   Follow up: Follow-up with your primary care provider on this issue.  May also need to follow-up with the neurologist for increased frequency of headaches. You have a COVID-19 test pending.  Review your MyChart account over the next 24 to 48 hours to check for results.

## 2019-03-25 LAB — NOVEL CORONAVIRUS, NAA (HOSP ORDER, SEND-OUT TO REF LAB; TAT 18-24 HRS): SARS-CoV-2, NAA: NOT DETECTED

## 2019-12-10 ENCOUNTER — Other Ambulatory Visit: Payer: Self-pay

## 2019-12-10 ENCOUNTER — Emergency Department (HOSPITAL_COMMUNITY)
Admission: EM | Admit: 2019-12-10 | Discharge: 2019-12-10 | Disposition: A | Discharge status: Home / Self Care | Payer: Self-pay | Attending: Emergency Medicine | Admitting: Emergency Medicine

## 2019-12-10 DIAGNOSIS — Z23 Encounter for immunization: Secondary | ICD-10-CM | POA: Insufficient documentation

## 2019-12-10 DIAGNOSIS — S61211A Laceration without foreign body of left index finger without damage to nail, initial encounter: Secondary | ICD-10-CM | POA: Insufficient documentation

## 2019-12-10 DIAGNOSIS — Y93G3 Activity, cooking and baking: Secondary | ICD-10-CM | POA: Insufficient documentation

## 2019-12-10 DIAGNOSIS — Y92000 Kitchen of unspecified non-institutional (private) residence as  the place of occurrence of the external cause: Secondary | ICD-10-CM | POA: Insufficient documentation

## 2019-12-10 DIAGNOSIS — Z91040 Latex allergy status: Secondary | ICD-10-CM | POA: Insufficient documentation

## 2019-12-10 DIAGNOSIS — Y999 Unspecified external cause status: Secondary | ICD-10-CM | POA: Insufficient documentation

## 2019-12-10 DIAGNOSIS — F17210 Nicotine dependence, cigarettes, uncomplicated: Secondary | ICD-10-CM | POA: Insufficient documentation

## 2019-12-10 DIAGNOSIS — W260XXA Contact with knife, initial encounter: Secondary | ICD-10-CM | POA: Insufficient documentation

## 2019-12-10 MED ORDER — TETANUS-DIPHTH-ACELL PERTUSSIS 5-2.5-18.5 LF-MCG/0.5 IM SUSP
0.5000 mL | Freq: Once | INTRAMUSCULAR | Status: AC
  Administered 2019-12-10: 0.5 mL via INTRAMUSCULAR
  Filled 2019-12-10: qty 0.5

## 2019-12-10 MED ORDER — TETANUS-DIPHTHERIA TOXOIDS TD 5-2 LFU IM INJ: 0.5000 mL | INJECTION | Freq: Once | INTRAMUSCULAR | Status: DC

## 2019-12-10 NOTE — ED Triage Notes (Addendum)
Per patient, she was cooking and knife went through the food and punctured her left index finger. Bleeding controlled at this time. Pain rated 9/10

## 2019-12-10 NOTE — ED Provider Notes (Signed)
[facility]   CSN: [id number] Arrival date & time: 12/10/19  1834     History Chief Complaint  Patient presents with  . finger puncture wound    left index    Jordan Ibarra is a 39 y.o. female.  39 year old female with prior medical history as detailed below presents for evaluation of a laceration to the left index finger.  Patient reports that she was at home cooking.  The knife slipped and she cut her left index finger.  She is not sure of her last tetanus shot.  She denies other injury.  She reports that the injury was bleeding initially.  She applied pressure.  She irrigated it thoroughly in her kitchen sink.  She reports that the bleeding has stopped.  She denies numbness or tingling distal to the injury itself.  She denies any difficulty moving the left index finger.  The history is provided by the patient and medical records.  Laceration Location: left index finger. Depth:  Cutaneous Quality: straight   Bleeding: controlled   Time since incident:  1 hour Laceration mechanism:  Knife Pain details:    Quality:  Aching   Severity:  Mild   Timing:  Constant Foreign body present:  No foreign bodies Worsened by:  Pressure Tetanus status:  Out of date      Past Medical History:  Diagnosis Date  . GERD (gastroesophageal reflux disease)    otc  Tums    Patient Active Problem List   Diagnosis Date Noted  . Cervical radiculopathy 01/22/2016  . Bilateral hand pain 11/15/2015  . CAP (community acquired pneumonia) 11/15/2015  . Chest wall pain 04/17/2015    Past Surgical History:  Procedure Laterality Date  . ABDOMINAL HYSTERECTOMY    . ANTERIOR CERVICAL DECOMP/DISCECTOMY FUSION N/A 01/22/2016   Procedure: Cervical six - seven Anterior cervical discectomy with fusion and plate fixation;  Surgeon: Loura Halt Ditty, MD;  Location: [facility];  Service: Neurosurgery;  Laterality: N/A;  C6-7 Anterior cervical  discectomy with fusion and plate fixation  . APPENDECTOMY    . bladder prolapse surgery    . TONSILLECTOMY       OB History   No obstetric history on file.     Family History  Problem Relation Age of Onset  . Kidney failure Mother   . Scoliosis Mother     Social History   Tobacco Use  . Smoking status: Current Every Day Smoker    Packs/day: 0.15    Years: 22.00    Pack years: 3.30    Types: Cigarettes  . Smokeless tobacco: Never Used  Substance Use Topics  . Alcohol use: Yes    Comment: "Not that often"  . Drug use: No    Comment: none in 10 years    Home Medications Prior to Admission medications   Medication Sig Start Date End Date Taking? Authorizing Provider  acetaminophen (TYLENOL) 500 MG tablet Take 1,000 mg by mouth every 8 (eight) hours as needed for mild pain, moderate pain or headache.     [provider]  clotrimazole-betamethasone (LOTRISONE) cream Apply to affected area 2 times daily prn Patient not taking: Reported on 01/03/2018 10/21/16   Ward, Chase Picket, PA-C  cyclobenzaprine (FLEXERIL) 10 MG tablet Take 1 tablet (10 mg total) by mouth 2 (two) times daily as needed for muscle spasms. Patient not taking: Reported on 01/03/2018 12/18/17   Michela Pitcher A, PA-C  dicyclomine (BENTYL) 20 MG tablet  Take 1 tablet (20 mg total) by mouth 2 (two) times daily. Patient not taking: Reported on 03/23/2019 02/05/18   Lacretia Leigh, MD  gabapentin (NEURONTIN) 300 MG capsule Take 1 capsule (300 mg total) by mouth 3 (three) times daily. Patient not taking: Reported on 01/03/2018 01/22/16   Ditty, Kevan Ny, MD    Allergies    Latex  Review of Systems   Review of Systems  All other systems reviewed and are negative.   Physical Exam Updated Vital Signs BP 126/80 (BP Location: Right Arm)   Pulse 70   Temp 97.9 F (36.6 C) (Oral)   Resp 16   Ht 5\' 4"  (1.626 m)   Wt 84.4 kg   SpO2 100%   BMI 31.93 kg/m   Physical Exam Vitals and nursing note  reviewed.  Constitutional:      General: She is not in acute distress.    Appearance: Normal appearance. She is well-developed.  HENT:     Head: Normocephalic and atraumatic.  Eyes:     Conjunctiva/sclera: Conjunctivae normal.     Pupils: Pupils are equal, round, and reactive to light.  Cardiovascular:     Rate and Rhythm: Normal rate and regular rhythm.     Heart sounds: Normal heart sounds.  Pulmonary:     Effort: Pulmonary effort is normal. No respiratory distress.     Breath sounds: Normal breath sounds.  Abdominal:     General: There is no distension.     Palpations: Abdomen is soft.     Tenderness: There is no abdominal tenderness.  Musculoskeletal:        General: No deformity. Normal range of motion.     Cervical back: Normal range of motion and neck supple.  Skin:    General: Skin is warm and dry.     Comments: Superficial laceration to the left index finger.  Laceration measures roughly 0.5 cm along the medial aspect of the left index finger.  Distal left index finger is neurovascular intact.  Full active range of motion noted of the left index finger.  No foreign body appreciated in the wound.  Neurological:     Mental Status: She is alert and oriented to person, place, and time.     ED Results / Procedures / Treatments   Labs (all labs ordered are listed, but only abnormal results are displayed) Labs Reviewed - No data to display  EKG None  Radiology No results found.  Procedures .Marland KitchenLaceration Repair  Date/Time: 12/10/2019 7:09 PM Performed by: Valarie Merino, MD Authorized by: Valarie Merino, MD   Consent:    Consent obtained:  Verbal   Consent given by:  Patient   Risks discussed:  Infection, need for additional repair, pain, poor wound healing, poor cosmetic result, nerve damage, retained foreign body, tendon damage and vascular damage   Alternatives discussed:  No treatment Anesthesia (see MAR for exact dosages):    Anesthesia method:   None Laceration details:    Location: left index finger    Length (cm):  0.5 Repair type:    Repair type:  Simple Exploration:    Hemostasis achieved with:  Direct pressure   Wound exploration: wound explored through full range of motion     Contaminated: no   Treatment:    Irrigation solution:  Tap water   Visualized foreign bodies/material removed: no   Skin repair:    Repair method:  Tissue adhesive Approximation:    Approximation:  Close Post-procedure details:  Dressing:  Open (no dressing)   Patient tolerance of procedure:  Tolerated well, no immediate complications   (including critical care time)  Medications Ordered in ED Medications  tetanus & diphtheria toxoids (adult) (TENIVAC) injection 0.5 mL (has no administration in time range)    ED Course  I have reviewed the triage vital signs and the nursing notes.  Pertinent labs & imaging results that were available during my care of the patient were reviewed by me and considered in my medical decision making (see chart for details).    MDM Rules/Calculators/A&P                      MDM  Screen complete  Jordan Ibarra was evaluated in Emergency Department on 12/10/2019 for the symptoms described in the history of present illness. She was evaluated in the context of the global COVID-19 pandemic, which necessitated consideration that the patient might be at risk for infection with the SARS-CoV-2 virus that causes COVID-19. Institutional protocols and algorithms that pertain to the evaluation of patients at risk for COVID-19 are in a state of rapid change based on information released by regulatory bodies including the CDC and federal and state organizations. These policies and algorithms were followed during the patient's care in the ED.  Patient is presenting for evaluation of superficial laceration to the left index finger.  Laceration was cleaned and repaired with Dermabond without difficulty  Patient's  tetanus was updated.  Importance of close follow-up was stressed.  Strict return precautions given and understood.  Final Clinical Impression(s) / ED Diagnoses Final diagnoses:  Laceration of left index finger without foreign body without damage to nail, initial encounter    Rx / DC Orders ED Discharge Orders    None       Wynetta Fines, MD 12/10/19 1910

## 2019-12-10 NOTE — Discharge Instructions (Addendum)
Please return for any problem.  Follow-up with your regular care provider as instructed. °

## 2020-02-20 ENCOUNTER — Ambulatory Visit: Payer: Self-pay | Attending: Internal Medicine

## 2020-02-20 DIAGNOSIS — Z23 Encounter for immunization: Secondary | ICD-10-CM

## 2020-02-20 NOTE — Progress Notes (Signed)
   Covid-19 Vaccination Clinic  Name:  LAASIA ARCOS    MRN: [id number] DOB: 1981/03/15  02/20/2020  Ms. Stalder was observed post Covid-19 immunization for 15 minutes without incident. She was provided with Vaccine Information Sheet and instruction to access the V-Safe system.   Ms. Davidovich was instructed to call 911 with any severe reactions post vaccine: Marland Kitchen Difficulty breathing  . Swelling of face and throat  . A fast heartbeat  . A bad rash all over body  . Dizziness and weakness   Immunizations Administered    Name Date Dose VIS Date Route   Pfizer COVID-19 Vaccine 02/20/2020  4:06 PM 0.3 mL 11/02/2018 Intramuscular   Manufacturer: [employer], [employer]   Lot: [id number]   NDC: [address]-4

## 2020-02-28 ENCOUNTER — Encounter (HOSPITAL_COMMUNITY): Payer: Self-pay | Admitting: Emergency Medicine

## 2020-02-28 ENCOUNTER — Other Ambulatory Visit: Payer: Self-pay

## 2020-02-28 ENCOUNTER — Inpatient Hospital Stay (HOSPITAL_COMMUNITY)
Admission: EM | Admit: 2020-02-28 | Discharge: 2020-03-01 | DRG: 603 | Disposition: A | Discharge status: Home / Self Care | Payer: Self-pay | Attending: Internal Medicine | Admitting: Internal Medicine

## 2020-02-28 DIAGNOSIS — Y92009 Unspecified place in unspecified non-institutional (private) residence as the place of occurrence of the external cause: Secondary | ICD-10-CM

## 2020-02-28 DIAGNOSIS — L089 Local infection of the skin and subcutaneous tissue, unspecified: Secondary | ICD-10-CM

## 2020-02-28 DIAGNOSIS — F17210 Nicotine dependence, cigarettes, uncomplicated: Secondary | ICD-10-CM | POA: Diagnosis present

## 2020-02-28 DIAGNOSIS — Z20822 Contact with and (suspected) exposure to covid-19: Secondary | ICD-10-CM | POA: Diagnosis present

## 2020-02-28 DIAGNOSIS — W5501XA Bitten by cat, initial encounter: Secondary | ICD-10-CM

## 2020-02-28 DIAGNOSIS — K219 Gastro-esophageal reflux disease without esophagitis: Secondary | ICD-10-CM | POA: Diagnosis present

## 2020-02-28 DIAGNOSIS — Z23 Encounter for immunization: Secondary | ICD-10-CM

## 2020-02-28 DIAGNOSIS — L03115 Cellulitis of right lower limb: Principal | ICD-10-CM | POA: Diagnosis present

## 2020-02-28 DIAGNOSIS — S81851A Open bite, right lower leg, initial encounter: Secondary | ICD-10-CM | POA: Diagnosis present

## 2020-02-28 LAB — COMPREHENSIVE METABOLIC PANEL
ALT: 13 U/L (ref 0–44)
AST: 13 U/L — ABNORMAL LOW (ref 15–41)
Albumin: 4.1 g/dL (ref 3.5–5.0)
Alkaline Phosphatase: 82 U/L (ref 38–126)
Anion gap: 9 (ref 5–15)
BUN: 17 mg/dL (ref 6–20)
CO2: 28 mmol/L (ref 22–32)
Calcium: 9.2 mg/dL (ref 8.9–10.3)
Chloride: 104 mmol/L (ref 98–111)
Creatinine, Ser: 0.88 mg/dL (ref 0.44–1.00)
GFR calc Af Amer: >60 mL/min (ref >60)
GFR calc non Af Amer: >60 mL/min (ref >60)
Glucose, Bld: 93 mg/dL (ref 70–99)
Potassium: 3.8 mmol/L (ref 3.5–5.1)
Sodium: 141 mmol/L (ref 135–145)
Total Bilirubin: 0.4 mg/dL (ref 0.3–1.2)
Total Protein: 7 g/dL (ref 6.5–8.1)

## 2020-02-28 LAB — CBC WITH DIFFERENTIAL/PLATELET
Abs Immature Granulocytes: 0.03 10*3/uL (ref 0.00–0.07)
Basophils Absolute: 0.1 10*3/uL (ref 0.0–0.1)
Basophils Relative: 1 %
Eosinophils Absolute: 0.4 10*3/uL (ref 0.0–0.5)
Eosinophils Relative: 4 %
HCT: 41.4 % (ref 36.0–46.0)
Hemoglobin: 13.4 g/dL (ref 12.0–15.0)
Immature Granulocytes: 0 %
Lymphocytes Relative: 33 %
Lymphs Abs: 3.2 10*3/uL (ref 0.7–4.0)
MCH: 28.9 pg (ref 26.0–34.0)
MCHC: 32.4 g/dL (ref 30.0–36.0)
MCV: 89.2 fL (ref 80.0–100.0)
Monocytes Absolute: 0.6 10*3/uL (ref 0.1–1.0)
Monocytes Relative: 7 %
Neutro Abs: 5.4 10*3/uL (ref 1.7–7.7)
Neutrophils Relative %: 55 %
Platelets: 269 10*3/uL (ref 150–400)
RBC: 4.64 MIL/uL (ref 3.87–5.11)
RDW: 13.3 % (ref 11.5–15.5)
WBC: 9.7 10*3/uL (ref 4.0–10.5)
nRBC: 0 % (ref 0.0–0.2)

## 2020-02-28 LAB — SARS CORONAVIRUS 2 BY RT PCR (HOSPITAL ORDER, PERFORMED IN ~~LOC~~ HOSPITAL LAB): SARS Coronavirus 2: NEGATIVE

## 2020-02-28 MED ORDER — SODIUM CHLORIDE 0.9 % IV SOLN
3.0000 g | Freq: Once | INTRAVENOUS | Status: AC
  Administered 2020-02-28: 3 g via INTRAVENOUS
  Filled 2020-02-28: qty 8

## 2020-02-28 MED ORDER — MORPHINE SULFATE (PF) 2 MG/ML IV SOLN: 2.0000 mg | INTRAVENOUS | Status: DC | PRN

## 2020-02-28 MED ORDER — POLYETHYLENE GLYCOL 3350 17 G PO PACK: 17.0000 g | PACK | Freq: Every day | ORAL | Status: DC | PRN

## 2020-02-28 MED ORDER — HYDROCODONE-ACETAMINOPHEN 5-325 MG PO TABS
1.0000 | ORAL_TABLET | ORAL | Status: DC | PRN
  Administered 2020-02-28 – 2020-02-29 (×3): 1 via ORAL
  Filled 2020-02-28 (×3): qty 1

## 2020-02-28 MED ORDER — ONDANSETRON HCL 4 MG PO TABS: 4.0000 mg | ORAL_TABLET | Freq: Four times a day (QID) | ORAL | Status: DC | PRN

## 2020-02-28 MED ORDER — SODIUM CHLORIDE 0.9 % IV SOLN
3.0000 g | Freq: Four times a day (QID) | INTRAVENOUS | Status: DC
  Administered 2020-02-28 – 2020-03-01 (×6): 3 g via INTRAVENOUS
  Filled 2020-02-28: qty 0.5
  Filled 2020-02-28: qty 3
  Filled 2020-02-28 (×2): qty 8
  Filled 2020-02-28: qty 0.5
  Filled 2020-02-28: qty 3
  Filled 2020-02-28 (×2): qty 8

## 2020-02-28 MED ORDER — ONDANSETRON HCL 4 MG/2ML IJ SOLN: 4.0000 mg | Freq: Four times a day (QID) | INTRAMUSCULAR | Status: DC | PRN

## 2020-02-28 MED ORDER — BISACODYL 5 MG PO TBEC
5.0000 mg | DELAYED_RELEASE_TABLET | Freq: Every day | ORAL | Status: DC | PRN
  Filled 2020-02-28: qty 1

## 2020-02-28 MED ORDER — MORPHINE SULFATE (PF) 4 MG/ML IV SOLN
4.0000 mg | Freq: Once | INTRAVENOUS | Status: AC
  Administered 2020-02-28: 4 mg via INTRAVENOUS
  Filled 2020-02-28: qty 1

## 2020-02-28 MED ORDER — ONDANSETRON HCL 4 MG/2ML IJ SOLN
4.0000 mg | Freq: Once | INTRAMUSCULAR | Status: AC
  Administered 2020-02-28: 4 mg via INTRAVENOUS
  Filled 2020-02-28: qty 2

## 2020-02-28 MED ORDER — ACETAMINOPHEN 325 MG PO TABS: 650.0000 mg | ORAL_TABLET | Freq: Four times a day (QID) | ORAL | Status: DC | PRN

## 2020-02-28 MED ORDER — SODIUM CHLORIDE 0.9 % IV SOLN
500.0000 mg | INTRAVENOUS | Status: DC
  Administered 2020-02-28 – 2020-02-29 (×2): 500 mg via INTRAVENOUS
  Filled 2020-02-28 (×2): qty 500

## 2020-02-28 MED ORDER — ACETAMINOPHEN 650 MG RE SUPP: 650.0000 mg | Freq: Four times a day (QID) | RECTAL | Status: DC | PRN

## 2020-02-28 NOTE — ED Triage Notes (Signed)
Pt bit on posterior right calf on Saturday. It is her cat and shots are up to date. Had a tetanus shot within the past 5 years.

## 2020-02-28 NOTE — ED Provider Notes (Signed)
[facility]PT Provider Note   CSN: [id number] Arrival date & time: 02/28/20  1108     History Chief Complaint  Patient presents with  . Animal Bite    Jordan Ibarra is a 39 y.o. female with no pertinent past medical history presents today for evaluation of a cat bite to her right calf.  She reports that 4 days ago her own cat was playing and bit her on her posterior right calf.  She reports that today when she woke up it was hurting and she noted what looks to be a purulent wound.  She reports her last tetanus shot was within the past 5 years.  Her cat is up-to-date on all vaccines including rabies.  She denies any fevers.  No N/V.  She reports significant pain.   HPI     Past Medical History:  Diagnosis Date  . GERD (gastroesophageal reflux disease)    otc  Tums    Patient Active Problem List   Diagnosis Date Noted  . Cervical radiculopathy 01/22/2016  . Bilateral hand pain 11/15/2015  . CAP (community acquired pneumonia) 11/15/2015  . Chest wall pain 04/17/2015    Past Surgical History:  Procedure Laterality Date  . ABDOMINAL HYSTERECTOMY    . ANTERIOR CERVICAL DECOMP/DISCECTOMY FUSION N/A 01/22/2016   Procedure: Cervical six - seven Anterior cervical discectomy with fusion and plate fixation;  Surgeon: Loura Halt Ditty, MD;  Location: [facility];  Service: Neurosurgery;  Laterality: N/A;  C6-7 Anterior cervical discectomy with fusion and plate fixation  . APPENDECTOMY    . bladder prolapse surgery    . TONSILLECTOMY       OB History   No obstetric history on file.     Family History  Problem Relation Age of Onset  . Kidney failure Mother   . Scoliosis Mother     Social History   Tobacco Use  . Smoking status: Current Every Day Smoker    Packs/day: 0.15    Years: 22.00    Pack years: 3.30    Types: Cigarettes  . Smokeless tobacco: Never Used  Vaping Use  . Vaping Use: Never used  Substance Use Topics  .  Alcohol use: Yes    Comment: "Not that often"  . Drug use: No    Comment: none in 10 years    Home Medications Prior to Admission medications   Medication Sig Start Date End Date Taking? Authorizing Provider  acetaminophen (TYLENOL) 500 MG tablet Take 1,000 mg by mouth every 8 (eight) hours as needed for mild pain, moderate pain or headache.     [provider]  clotrimazole-betamethasone (LOTRISONE) cream Apply to affected area 2 times daily prn Patient not taking: Reported on 01/03/2018 10/21/16   Ward, Chase Picket, PA-C  cyclobenzaprine (FLEXERIL) 10 MG tablet Take 1 tablet (10 mg total) by mouth 2 (two) times daily as needed for muscle spasms. Patient not taking: Reported on 01/03/2018 12/18/17   Michela Pitcher A, PA-C  dicyclomine (BENTYL) 20 MG tablet Take 1 tablet (20 mg total) by mouth 2 (two) times daily. Patient not taking: Reported on 03/23/2019 02/05/18   Lorre Nick, MD  gabapentin (NEURONTIN) 300 MG capsule Take 1 capsule (300 mg total) by mouth 3 (three) times daily. Patient not taking: Reported on 01/03/2018 01/22/16   Ditty, Loura Halt, MD    Allergies    Latex  Review of Systems   Review of Systems  Constitutional: Negative for chills, fatigue and  fever.  Respiratory: Negative for chest tightness and shortness of breath.   Gastrointestinal: Negative for nausea and vomiting.  Skin: Positive for wound.  All other systems reviewed and are negative.   Physical Exam Updated Vital Signs BP 124/87   Pulse 66   Temp 99 F (37.2 C)   Resp 16   Ht 5\' 5"  (1.651 m)   Wt 88.3 kg   SpO2 98%   BMI 32.40 kg/m   Physical Exam Vitals and nursing note reviewed.  Constitutional:      General: She is not in acute distress.    Appearance: She is well-developed. She is not diaphoretic.  HENT:     Head: Normocephalic and atraumatic.  Eyes:     General: No scleral icterus.       Right eye: No discharge.        Left eye: No discharge.     Conjunctiva/sclera:  Conjunctivae normal.  Cardiovascular:     Rate and Rhythm: Normal rate and regular rhythm.  Pulmonary:     Effort: Pulmonary effort is normal. No respiratory distress.     Breath sounds: No stridor.  Abdominal:     General: There is no distension.  Musculoskeletal:        General: No deformity.     Cervical back: Normal range of motion.     Right lower leg: No edema.     Comments: Right lower extremity with no subcutaneous crepitus.  Mild edema.  There is diffuse TTP over the right proximal lower leg even proximal to the bite site.   Skin:    General: Skin is warm and dry.     Comments: Please see clinical image.  There is an area of erythema measuring about 15cmx10cm at the widest.  There are three wounds on the posterior leg, 2 of which have surrounding purulent material.   Neurological:     General: No focal deficit present.     Mental Status: She is alert.     Cranial Nerves: No cranial nerve deficit.     Motor: No abnormal muscle tone.  Psychiatric:        Mood and Affect: Mood normal.        Behavior: Behavior normal.         ED Results / Procedures / Treatments   Labs (all labs ordered are listed, but only abnormal results are displayed) Labs Reviewed  COMPREHENSIVE METABOLIC PANEL - Abnormal; Notable for the following components:      Result Value   AST 13 (*)    All other components within normal limits  SARS CORONAVIRUS 2 BY RT PCR (HOSPITAL ORDER, [facility])  AEROBIC/ANAEROBIC CULTURE (SURGICAL/DEEP WOUND)  CBC WITH DIFFERENTIAL/PLATELET    EKG None  Radiology No results found.  Procedures Ultrasound ED Soft Tissue  Date/Time: 02/28/2020 3:37 PM Performed by: Lorin Glass, PA-C Authorized by: Lorin Glass, PA-C   Procedure details:    Indications: limb pain, localization of abscess and evaluate for cellulitis     Transverse view:  Visualized   Longitudinal view:  Visualized   Images: archived      Visualization limited by: Patient pain. Location:    Location: lower extremity     Side:  Right Findings:     abscess present    cellulitis present Comments:     Abscess appears to track into deep space.  Exam limited by patient tolerance/pain.    (including critical care time)  Medications Ordered in ED Medications  morphine 4 MG/ML injection 4 mg (4 mg Intravenous Given 02/28/20 1304)  Ampicillin-Sulbactam (UNASYN) 3 g in sodium chloride 0.9 % 100 mL IVPB (0 g Intravenous Stopped 02/28/20 1410)  ondansetron (ZOFRAN) injection 4 mg (4 mg Intravenous Given 02/28/20 1317)    ED Course  I have reviewed the triage vital signs and the nursing notes.  Pertinent labs & imaging results that were available during my care of the patient were reviewed by me and considered in my medical decision making (see chart for details).  Clinical Course as of Feb 27 1537  Tue Feb 28, 2020  1450 I spoke with Dr. Donnie Mesa nurse as he is scrubbed in at the surgical center.  She reports he stated no indication for imaging currently, will see patient for evaluation when able to.   [EH]    Clinical Course User Index [EH] Norman Clay   MDM Rules/Calculators/A&P                         Patient is a 39 year old woman who presents today for evaluation of a cat bite to her right calf 4 days ago.  On exam she has a approximately 10 x 15 cm of erythema on her posterior right calf.  There are central pustules.  Full I&D was not performed, however pustules were deroofed using ChloraPrep and cold spray to obtain cultures with a moderate amount of purulent material, more than I would expect from a localized small infection.    Given the diffuse tender nature concern for deeper infection.  Bedside ultrasound was performed with concern for infection tracking into the deeper tissues instead of being confined to subcutaneous space.  She reports her tetanus is up-to-date and the cat is up-to-date on all vaccines  including rabies.  No indication for rabies vaccine at this time.  She is treated with IV Unasyn.  Dr. Susa Simmonds from orthopedics is aware patient is here, will evaluate.  Spoke with Dr. Tresa Endo of hospitalist who will see patient for admission.  Dr. Susa Simmonds advises no indication for radiology at this time.  Note: Portions of this report may have been transcribed using voice recognition software. Every effort was made to ensure accuracy; however, inadvertent computerized transcription errors may be present   Final Clinical Impression(s) / ED Diagnoses Final diagnoses:  Infected cat bite of lower leg, right, initial encounter    Rx / DC Orders ED Discharge Orders    None       Norman Clay 02/28/20 1538    Margarita Grizzle, MD 03/01/20 1344

## 2020-02-28 NOTE — ED Notes (Signed)
Pt ambulatory in room, provided tray. Pt states she does not eat fish, provided 2 sandwiches and water.

## 2020-02-28 NOTE — Consult Note (Signed)
Reason for Consult: Right posterior calf cat bite wounds with surrounding erythema and concern for infection Referring Physician: [facility]  Jordan Ibarra is an 39 y.o. female.  HPI: Sustained cat bites to her right posterior calf on Saturday, 02/25/2020.  Over the ensuing days she developed some pain, redness and drainage from the wounds.  She presented to the emergency department due to concern for infection.  She denies fevers or chills.  She has worsening pain.  Is achy and sharp in quality.  She has been able to ambulate without difficulty.   Past Medical History:  Diagnosis Date  . GERD (gastroesophageal reflux disease)    otc  Tums    Past Surgical History:  Procedure Laterality Date  . ABDOMINAL HYSTERECTOMY    . ANTERIOR CERVICAL DECOMP/DISCECTOMY FUSION N/A 01/22/2016   Procedure: Cervical six - seven Anterior cervical discectomy with fusion and plate fixation;  Surgeon: Loura Halt Ditty, MD;  Location: [facility];  Service: Neurosurgery;  Laterality: N/A;  C6-7 Anterior cervical discectomy with fusion and plate fixation  . APPENDECTOMY    . bladder prolapse surgery    . TONSILLECTOMY      Family History  Problem Relation Age of Onset  . Kidney failure Mother   . Scoliosis Mother     Social History:  reports that she has been smoking cigarettes. She has a 3.30 pack-year smoking history. She has never used smokeless tobacco. She reports current alcohol use. She reports that she does not use drugs.  Allergies:  Allergies  Allergen Reactions  . Latex Rash    Medications: I have reviewed the patient's current medications.  Results for orders placed or performed during the hospital encounter of 02/28/20 (from the past 48 hour(s))  CBC with Differential     Status: None   Collection Time: 02/28/20 12:38 PM  Result Value Ref Range   WBC 9.7 4.0 - 10.5 K/uL   RBC 4.64 3.87 - 5.11 MIL/uL   Hemoglobin 13.4 12.0 - 15.0 g/dL   HCT 93.2 36 - 46  %   MCV 89.2 80.0 - 100.0 fL   MCH 28.9 26.0 - 34.0 pg   MCHC 32.4 30.0 - 36.0 g/dL   RDW 35.5 73.2 - 20.2 %   Platelets 269 150 - 400 K/uL   nRBC 0.0 0.0 - 0.2 %   Neutrophils Relative % 55 %   Neutro Abs 5.4 1.7 - 7.7 K/uL   Lymphocytes Relative 33 %   Lymphs Abs 3.2 0.7 - 4.0 K/uL   Monocytes Relative 7 %   Monocytes Absolute 0.6 0 - 1 K/uL   Eosinophils Relative 4 %   Eosinophils Absolute 0.4 0 - 0 K/uL   Basophils Relative 1 %   Basophils Absolute 0.1 0 - 0 K/uL   Immature Granulocytes 0 %   Abs Immature Granulocytes 0.03 0.00 - 0.07 K/uL    Comment: Performed at [facility], [address] [place], [place] [address]  Comprehensive metabolic panel     Status: Abnormal   Collection Time: 02/28/20 12:38 PM  Result Value Ref Range   Sodium 141 135 - 145 mmol/L   Potassium 3.8 3.5 - 5.1 mmol/L   Chloride 104 98 - 111 mmol/L   CO2 28 22 - 32 mmol/L   Glucose, Bld 93 70 - 99 mg/dL    Comment: Glucose reference range applies only to samples taken after fasting for at least 8 hours.   BUN 17  6 - 20 mg/dL   Creatinine, Ser 1.75 0.44 - 1.00 mg/dL   Calcium 9.2 8.9 - 10.2 mg/dL   Total Protein 7.0 6.5 - 8.1 g/dL   Albumin 4.1 3.5 - 5.0 g/dL   AST 13 (L) 15 - 41 U/L   ALT 13 0 - 44 U/L   Alkaline Phosphatase 82 38 - 126 U/L   Total Bilirubin 0.4 0.3 - 1.2 mg/dL   GFR calc non Af Amer >60 >60 mL/min   GFR calc Af Amer >60 >60 mL/min   Anion gap 9 5 - 15    Comment: Performed at [facility], [address] [place], [place] [address]  SARS Coronavirus 2 by RT PCR (hospital order, performed in [facility] hospital lab) Nasopharyngeal Nasopharyngeal Swab     Status: None   Collection Time: 02/28/20  2:08 PM   Specimen: Nasopharyngeal Swab  Result Value Ref Range   SARS Coronavirus 2 NEGATIVE NEGATIVE    Comment: (NOTE) SARS-CoV-2 target nucleic acids are NOT DETECTED.  The SARS-CoV-2 RNA is generally detectable in upper and  lower respiratory specimens during the acute phase of infection. The lowest concentration of SARS-CoV-2 viral copies this assay can detect is 250 copies / mL. A negative result does not preclude SARS-CoV-2 infection and should not be used as the sole basis for treatment or other patient management decisions.  A negative result may occur with improper specimen collection / handling, submission of specimen other than nasopharyngeal swab, presence of viral mutation(s) within the areas targeted by this assay, and inadequate number of viral copies (<250 copies / mL). A negative result must be combined with clinical observations, patient history, and epidemiological information.  Fact Sheet for Patients:   BoilerBrush.com.cy  Fact Sheet for Healthcare Providers: https://pope.com/  This test is not yet approved or  cleared by the [place] FDA and has been authorized for detection and/or diagnosis of SARS-CoV-2 by FDA under an Emergency Use Authorization (EUA).  This EUA will remain in effect (meaning this test can be used) for the duration of the COVID-19 declaration under Section 564(b)(1) of the Act, 21 U.S.C. section 360bbb-3(b)(1), unless the authorization is terminated or revoked sooner.  Performed at [facility], [address] [place], [place] [address]     No results found.  Review of Systems  Constitutional: Negative.   HENT: Negative.   Eyes: Negative.   Respiratory: Negative.   Cardiovascular: Negative.   Gastrointestinal: Negative.   Musculoskeletal:       Right leg pain and swelling  Skin:       Redness and small wounds to right posterior leg  Neurological: Negative.   Hematological: Negative.   Psychiatric/Behavioral: Negative.    Blood pressure 124/87, pulse 66, temperature 99 F (37.2 C), resp. rate 16, height 5\' 5"  (1.651 m), weight 88.3 kg, SpO2 98 %. Physical Exam  HENT:  Head:  Normocephalic.  Nose: Nose normal.  Mouth/Throat: Mucous membranes are moist.  Cardiovascular: Normal rate.  Respiratory: Effort normal.  GI: Soft.  Musculoskeletal:     Cervical back: Neck supple.     Comments: Right leg demonstrates no swelling and erythema posteri the posterior calf.  orly about the calf.  There is 2 small apparent puncture wounds the most proximal one is draining a purulent material.  There is increased drainage with expression of the posterior calf.  Smaller distal wound is not currently draining and there is no surrounding fluctuance or expressible material.  Tender to palpation.  Patient is able to ambulate.  Skin: Skin is warm. There is erythema.  Psychiatric: Mood normal.    Assessment/Plan: Patient sustained cat bites to the posterior aspect of her right calf.  The more proximal one is draining and I am able to express fluid from it.  Therefore we will allow this to continue to drain and will hold off on surgery for now.  She does have some surrounding erythema.  Cultures were obtained by the emergency department and are pending.  Plan is for patient to be admitted to the hospitalist service for IV antibiotics.  We will continue to monitor the patient.  I did discuss with her that if the drainage stops but her symptoms worsen she will require formal incision and drainage and debridement in the operating room.  Currently her white count is normal and she is afebrile.  She does not need any advanced imaging for now.  Erle Crocker 02/28/2020, 4:16 PM

## 2020-02-28 NOTE — H&P (Signed)
History and Physical    Jordan Ibarra GUR:[id number] DOB: 1980/11/15 DOA: 02/28/2020  PCP: Larene Beach, MD  Patient coming from: Home  I have personally briefly reviewed patient's old medical records in [place]  Chief Complaint: Right leg pain due to a cat bite  HPI: Jordan Ibarra is a 39 y.o. female with no past medical history presented to the ED today due to worsening right lower extremity pain.  She reports that her cat bit her on the leg 4 days ago and she has since developed worsening erythema and pain in that leg.  She says the bite was fairly deep.  States pain became much more severe overnight despite use of cold compresses.  She denies fevers or chills.  Reports her son looked at her leg this morning and noted draining pustules so they called her PCP who recommended she come to the ED for evaluation.  Patient otherwise denies recent illnesses including cough, congestion, sore throat, abdominal pain, nausea vomiting or diarrhea, dysuria or urinary frequency, headaches or other acute complaints.   ED Course: Temp 99, BP soft at 93/74, other vitals normal.  CBC and CMP were unremarkable.  COVID-19 negative.  Orthopedics was consulted in case of need for I&D.  Cultures were obtained.  She was treated with IV Unasyn.  Admitted to hospitalist service for further evaluation and management.  Review of Systems: As per HPI otherwise 10 point review of systems negative.    Past Medical History:  Diagnosis Date  . GERD (gastroesophageal reflux disease)    otc  Tums    Past Surgical History:  Procedure Laterality Date  . ABDOMINAL HYSTERECTOMY    . ANTERIOR CERVICAL DECOMP/DISCECTOMY FUSION N/A 01/22/2016   Procedure: Cervical six - seven Anterior cervical discectomy with fusion and plate fixation;  Surgeon: Kevan Ny Ditty, MD;  Location: [facility];  Service: Neurosurgery;  Laterality: N/A;  C6-7 Anterior cervical discectomy with fusion and plate fixation  .  APPENDECTOMY    . bladder prolapse surgery    . TONSILLECTOMY       reports that she has been smoking cigarettes. She has a 3.30 pack-year smoking history. She has never used smokeless tobacco. She reports current alcohol use. She reports that she does not use drugs.  Allergies  Allergen Reactions  . Latex Rash    Family History  Problem Relation Age of Onset  . Kidney failure Mother   . Scoliosis Mother   . Ovarian cancer Sister   . Lung cancer Maternal Grandmother      Prior to Admission medications   Medication Sig Start Date End Date Taking? Authorizing Provider  acetaminophen (TYLENOL) 500 MG tablet Take 1,000 mg by mouth every 8 (eight) hours as needed for mild pain, moderate pain or headache.     [provider]  clotrimazole-betamethasone (LOTRISONE) cream Apply to affected area 2 times daily prn Patient not taking: Reported on 01/03/2018 10/21/16   Ward, Ozella Almond, PA-C  cyclobenzaprine (FLEXERIL) 10 MG tablet Take 1 tablet (10 mg total) by mouth 2 (two) times daily as needed for muscle spasms. Patient not taking: Reported on 01/03/2018 12/18/17   Rodell Perna A, PA-C  dicyclomine (BENTYL) 20 MG tablet Take 1 tablet (20 mg total) by mouth 2 (two) times daily. Patient not taking: Reported on 03/23/2019 02/05/18   Lacretia Leigh, MD  gabapentin (NEURONTIN) 300 MG capsule Take 1 capsule (300 mg total) by mouth 3 (three) times daily. Patient not taking: Reported on  01/03/2018 01/22/16   Ditty, Loura Halt, MD    Physical Exam: Vitals:   02/28/20 1956 02/28/20 1957 02/28/20 1958 02/28/20 1959  BP:      Pulse: 70 72 77 78  Resp:      Temp:      SpO2: 100% 98% 100% 99%  Weight:      Height:         Constitutional: NAD, calm, comfortable Eyes: PERRL, lids and conjunctivae normal ENMT: Mucous membranes are moist. Posterior pharynx clear of any exudate or lesions.Normal dentition.  Respiratory: CTAB, no wheezing, no crackles. Normal respiratory effort. No  accessory muscle use.  Cardiovascular: RRR, no murmurs / rubs / gallops. No extremity edema. 2+ pedal pulses. No carotid bruits.  Abdomen: soft, NT, ND, no masses or HSM palpated. +Bowel sounds.  Musculoskeletal: right calf has dressing around site of cat bite, visible area of erythema has been outlined, tenderness on palpation.  No joint deformity upper and lower extremities. Normal muscle tone.  Skin: dry, intact, normal color, normal temperature Neurologic: CN 2-12 grossly intact. Normal speech.  Grossly non-focal exam. Psychiatric: Alert and oriented x 3. Normal mood. Congruent affect.  Normal judgement and insight.   Labs on Admission: I have personally reviewed following labs and imaging studies  CBC: Recent Labs  Lab 02/28/20 1238  WBC 9.7  NEUTROABS 5.4  HGB 13.4  HCT 41.4  MCV 89.2  PLT 269   Basic Metabolic Panel: Recent Labs  Lab 02/28/20 1238  NA 141  K 3.8  CL 104  CO2 28  GLUCOSE 93  BUN 17  CREATININE 0.88  CALCIUM 9.2   GFR: Estimated Creatinine Clearance: 93.2 mL/min (by C-G formula based on SCr of 0.88 mg/dL). Liver Function Tests: Recent Labs  Lab 02/28/20 1238  AST 13*  ALT 13  ALKPHOS 82  BILITOT 0.4  PROT 7.0  ALBUMIN 4.1   No results for input(s): LIPASE, AMYLASE in the last 168 hours. No results for input(s): AMMONIA in the last 168 hours. Coagulation Profile: No results for input(s): INR, PROTIME in the last 168 hours. Cardiac Enzymes: No results for input(s): CKTOTAL, CKMB, CKMBINDEX, TROPONINI in the last 168 hours. BNP (last 3 results) No results for input(s): PROBNP in the last 8760 hours. HbA1C: No results for input(s): HGBA1C in the last 72 hours. CBG: No results for input(s): GLUCAP in the last 168 hours. Lipid Profile: No results for input(s): CHOL, HDL, LDLCALC, TRIG, CHOLHDL, LDLDIRECT in the last 72 hours. Thyroid Function Tests: No results for input(s): TSH, T4TOTAL, FREET4, T3FREE, THYROIDAB in the last 72  hours. Anemia Panel: No results for input(s): VITAMINB12, FOLATE, FERRITIN, TIBC, IRON, RETICCTPCT in the last 72 hours. Urine analysis:    Component Value Date/Time   COLORURINE YELLOW 02/05/2018 1617   APPEARANCEUR CLEAR 02/05/2018 1617   LABSPEC 1.021 02/05/2018 1617   PHURINE 5.0 02/05/2018 1617   GLUCOSEU NEGATIVE 02/05/2018 1617   HGBUR NEGATIVE 02/05/2018 1617   BILIRUBINUR NEGATIVE 02/05/2018 1617   KETONESUR NEGATIVE 02/05/2018 1617   PROTEINUR NEGATIVE 02/05/2018 1617   UROBILINOGEN 0.2 05/17/2015 0945   NITRITE NEGATIVE 02/05/2018 1617   LEUKOCYTESUR NEGATIVE 02/05/2018 1617    Radiological Exams on Admission: No results found.    Assessment/Plan Active Problems:   Cat bite of right lower leg with infection   Cat bite of right lower leg with infection - POA.  Cat bite occurred on 02/25/20. --IV Unasyn --PO Zithromax (empiric for lymphadenitis) --Ortho is following --Pain control --Monitor  pain and erythema closely, may require surgical I&D if pustules not draining or worsening pain   DVT prophylaxis: SCDs Start: 02/28/20 1607   Code Status: full  Family Communication: none at bedside, will attempt call  Disposition Plan: d/c home likely in 24-48 hours if improving and cleared by ortho  Consults called: orthopedics   Admission status:  Status is: Observation  The patient remains OBS appropriate and will d/c before 2 midnights.  Dispo: The patient is from: Home              Anticipated d/c is to: Home              Anticipated d/c date is: 1 day              Patient currently is not medically stable to d/c.    Pennie Banter, DO [facility]  02/28/2020, 8:08 PM    If 7PM-7AM, please contact night-coverage. How to contact the [facility] Attending or Consulting provider 7A - 7P or covering provider during after hours 7P -7A, for this patient?    1. Check the care team in [facility] and look for a) attending/consulting [facility] provider listed and b) the [facility]  team listed 2. Log into [email or web address] and use [facility]'s universal password to access. If you do not have the password, please contact the hospital operator. 3. Locate the [facility] provider you are looking for under [facility] and page to a number that you can be directly reached. 4. If you still have difficulty reaching the provider, please page the [facility] (Director on Call) for the Hospitalists listed on amion for assistance.

## 2020-02-29 DIAGNOSIS — S81851D Open bite, right lower leg, subsequent encounter: Secondary | ICD-10-CM

## 2020-02-29 DIAGNOSIS — W5501XD Bitten by cat, subsequent encounter: Secondary | ICD-10-CM

## 2020-02-29 LAB — BASIC METABOLIC PANEL
Anion gap: 12 (ref 5–15)
BUN: 18 mg/dL (ref 6–20)
CO2: 24 mmol/L (ref 22–32)
Calcium: 8.9 mg/dL (ref 8.9–10.3)
Chloride: 105 mmol/L (ref 98–111)
Creatinine, Ser: 0.82 mg/dL (ref 0.44–1.00)
GFR calc Af Amer: >60 mL/min (ref >60)
GFR calc non Af Amer: >60 mL/min (ref >60)
Glucose, Bld: 110 mg/dL — ABNORMAL HIGH (ref 70–99)
Potassium: 4.5 mmol/L (ref 3.5–5.1)
Sodium: 141 mmol/L (ref 135–145)

## 2020-02-29 LAB — CBC
HCT: 42.1 % (ref 36.0–46.0)
Hemoglobin: 13.8 g/dL (ref 12.0–15.0)
MCH: 29.1 pg (ref 26.0–34.0)
MCHC: 32.8 g/dL (ref 30.0–36.0)
MCV: 88.6 fL (ref 80.0–100.0)
Platelets: 301 10*3/uL (ref 150–400)
RBC: 4.75 MIL/uL (ref 3.87–5.11)
RDW: 13.3 % (ref 11.5–15.5)
WBC: 8.9 10*3/uL (ref 4.0–10.5)
nRBC: 0 % (ref 0.0–0.2)

## 2020-02-29 LAB — HIV ANTIBODY (ROUTINE TESTING W REFLEX): HIV Screen 4th Generation wRfx: NONREACTIVE

## 2020-02-29 MED ORDER — ENOXAPARIN SODIUM 40 MG/0.4ML ~~LOC~~ SOLN
40.0000 mg | SUBCUTANEOUS | Status: DC
  Administered 2020-02-29: 40 mg via SUBCUTANEOUS
  Filled 2020-02-29: qty 0.4

## 2020-02-29 MED ORDER — TETANUS-DIPHTHERIA TOXOIDS TD 5-2 LFU IM INJ
0.5000 mL | INJECTION | Freq: Once | INTRAMUSCULAR | Status: DC
  Filled 2020-02-29: qty 0.5

## 2020-02-29 NOTE — Progress Notes (Signed)
PROGRESS NOTE    Jordan Ibarra  NAT:[id number] DOB: 03-24-1981 DOA: 02/28/2020 PCP: Alain Honey, MD  Brief Narrative:Right leg pain due to a cat bite  HPI: Jordan Ibarra is a 39 y.o. female with no past medical history presented to the ED today due to worsening right lower extremity pain.  She reports that her cat bit her on the leg 4 days ago and she has since developed worsening erythema and pain in that leg.   ED Course: Orthopedics was consulted in case of need for I&D.  Cultures were obtained.  She was treated with IV Unasyn.   Assessment & Plan:   Right calf, lower extremity cellulitis -Following cat bite on 6/19 -Multiple wounds with purulence, clinically improving on IV antibiotics -Continue IV Unasyn today -Appreciate orthopedics input, may not need I&D -Will order a dose of tetanus toxoid -Ambulate as tolerated -Home tomorrow if stable/improving  DVT prophylaxis: Lovenox Code Status: Full code Family Communication: Spouse at bedside Disposition Plan:  Status is: Observation  The patient will require care spanning > 2 midnights and should be moved to inpatient because: Ongoing active pain requiring inpatient pain management, IV treatments appropriate due to intensity of illness or inability to take PO and Inpatient level of care appropriate due to severity of illness  Dispo: The patient is from: Home              Anticipated d/c is to: Home              Anticipated d/c date is: 1 day              Patient currently is not medically stable to d/c.  Consultants:   Orthopedics   Procedures:   Antimicrobials:    Subjective: -Feels better today, continues to have moderate pain and purulent discharge  Objective: Vitals:   02/28/20 2007 02/28/20 2008 02/29/20 0438 02/29/20 0622  BP:   108/67 106/73  Pulse: 72 78 72 64  Resp:   16 15  Temp:   98 F (36.7 C) 97.9 F (36.6 C)  TempSrc:   Oral Oral  SpO2: 100% 100% 100% 96%  Weight:      Height:         Intake/Output Summary (Last 24 hours) at 02/29/2020 0941 Last data filed at 02/29/2020 0939 Gross per 24 hour  Intake 757.94 ml  Output --  Net 757.94 ml   Filed Weights   02/28/20 1119  Weight: 88.3 kg    Examination:  General exam: AAOx3, no distress Respiratory system: Clear to auscultation. Cardiovascular system: S1 & S2 heard, RRR.  Gastrointestinal system: Abdomen is nondistended, soft and nontender.Normal bowel sounds heard. Central nervous system: Alert and oriented. No focal neurological deficits. Extremities: Right calf, with multiple open wounds draining pus, surrounding erythema and mild tenderness, induration, no fluctuation Skin: As above Psychiatry: Judgement and insight appear normal. Mood & affect appropriate.     Data Reviewed:   CBC: Recent Labs  Lab 02/28/20 1238 02/29/20 0810  WBC 9.7 8.9  NEUTROABS 5.4  --   HGB 13.4 13.8  HCT 41.4 42.1  MCV 89.2 88.6  PLT 269 301   Basic Metabolic Panel: Recent Labs  Lab 02/28/20 1238 02/29/20 0810  NA 141 141  K 3.8 4.5  CL 104 105  CO2 28 24  GLUCOSE 93 110*  BUN 17 18  CREATININE 0.88 0.82  CALCIUM 9.2 8.9   GFR: Estimated Creatinine Clearance: 100.1 mL/min (by C-G formula based  on SCr of 0.82 mg/dL). Liver Function Tests: Recent Labs  Lab 02/28/20 1238  AST 13*  ALT 13  ALKPHOS 82  BILITOT 0.4  PROT 7.0  ALBUMIN 4.1   No results for input(s): LIPASE, AMYLASE in the last 168 hours. No results for input(s): AMMONIA in the last 168 hours. Coagulation Profile: No results for input(s): INR, PROTIME in the last 168 hours. Cardiac Enzymes: No results for input(s): CKTOTAL, CKMB, CKMBINDEX, TROPONINI in the last 168 hours. BNP (last 3 results) No results for input(s): PROBNP in the last 8760 hours. HbA1C: No results for input(s): HGBA1C in the last 72 hours. CBG: No results for input(s): GLUCAP in the last 168 hours. Lipid Profile: No results for input(s): CHOL, HDL, LDLCALC, TRIG,  CHOLHDL, LDLDIRECT in the last 72 hours. Thyroid Function Tests: No results for input(s): TSH, T4TOTAL, FREET4, T3FREE, THYROIDAB in the last 72 hours. Anemia Panel: No results for input(s): VITAMINB12, FOLATE, FERRITIN, TIBC, IRON, RETICCTPCT in the last 72 hours. Urine analysis:    Component Value Date/Time   COLORURINE YELLOW 02/05/2018 [place] 02/05/2018 1617   LABSPEC 1.021 02/05/2018 1617   PHURINE 5.0 02/05/2018 1617   GLUCOSEU NEGATIVE 02/05/2018 1617   HGBUR NEGATIVE 02/05/2018 1617   BILIRUBINUR NEGATIVE 02/05/2018 1617   KETONESUR NEGATIVE 02/05/2018 1617   PROTEINUR NEGATIVE 02/05/2018 1617   UROBILINOGEN 0.2 05/17/2015 0945   NITRITE NEGATIVE 02/05/2018 1617   LEUKOCYTESUR NEGATIVE 02/05/2018 1617   Sepsis Labs: @LABRCNTIP (procalcitonin:4,lacticidven:4)  ) Recent Results (from the past 240 hour(s))  Aerobic/Anaerobic Culture (surgical/deep wound)     Status: None (Preliminary result)   Collection Time: 02/28/20 12:38 PM   Specimen: Leg; Abscess  Result Value Ref Range Status   Specimen Description   Final    LEG RIGHT Performed at [facility], [place] [address] [place], [place] [address]    Special Requests   Final    NONE Performed at [facility], [place] [address], [place], [place] [address]    Gram Stain   Final    RARE WBC PRESENT,BOTH PMN AND MONONUCLEAR NO ORGANISMS SEEN Performed at [facility], [place] [address], [place], [place] [address]    Culture PENDING  Incomplete   Report Status PENDING  Incomplete  SARS Coronavirus 2 by RT PCR (hospital order, performed in [facility] hospital lab) Nasopharyngeal Nasopharyngeal Swab     Status: None   Collection Time: 02/28/20  2:08 PM   Specimen: Nasopharyngeal Swab  Result Value Ref Range Status   SARS Coronavirus 2 NEGATIVE NEGATIVE Final    Comment: (NOTE) SARS-CoV-2 target nucleic acids are NOT DETECTED.  The SARS-CoV-2 RNA is generally  detectable in upper and lower respiratory specimens during the acute phase of infection. The lowest concentration of SARS-CoV-2 viral copies this assay can detect is 250 copies / mL. A negative result does not preclude SARS-CoV-2 infection and should not be used as the sole basis for treatment or other patient management decisions.  A negative result may occur with improper specimen collection / handling, submission of specimen other than nasopharyngeal swab, presence of viral mutation(s) within the areas targeted by this assay, and inadequate number of viral copies (<250 copies / mL). A negative result must be combined with clinical observations, patient history, and epidemiological information.  Fact Sheet for Patients:   StrictlyIdeas.no  Fact Sheet for Healthcare Providers: BankingDealers.co.za  This test is not yet approved or  cleared by the [place] FDA  and has been authorized for detection and/or diagnosis of SARS-CoV-2 by FDA under an Emergency Use Authorization (EUA).  This EUA will remain in effect (meaning this test can be used) for the duration of the COVID-19 declaration under Section 564(b)(1) of the Act, 21 U.S.C. section 360bbb-3(b)(1), unless the authorization is terminated or revoked sooner.  Performed at [facility], [address], [place], [place] [address]          Radiology Studies: No results found.      Scheduled Meds: Continuous Infusions: . ampicillin-sulbactam (UNASYN) IV 3 g (02/29/20 0828)  . azithromycin Stopped (02/28/20 2210)     LOS: 0 days    Time spent:    Zannie Cove, MD [facility] 02/29/2020, 9:41 AM

## 2020-02-29 NOTE — Progress Notes (Signed)
     Jordan Ibarra is a 39 y.o. female   Orthopaedic diagnosis:Right posterior calf cat bite with infection  Subjective: Patient reports improvement in her symptoms today.  She was able to express some fluid out of both of her wounds.  Denies fevers or chills.  Objectyive: Vitals:   02/29/20 0438 02/29/20 0622  BP: 108/67 106/73  Pulse: 72 64  Resp: 16 15  Temp: 98 F (36.7 C) 97.9 F (36.6 C)  SpO2: 100% 96%     Exam: Awake and alert Respirations even and unlabored No acute distress  Posterior right calf demonstrates no worsened redness.  Smaller expressible drainage from proximal and distal wounds.  No gross fluctuance.  Assessment: Right posterior calf cat bite with surrounding cellulitis and draining wounds   Plan: We will proceed with current plan.  She is having improvement with antibiotics and the wounds continue to drain.  We will continue to follow along to ensure that she continues to improve.  Cultures are pending.   Nicki [place], MD

## 2020-03-01 LAB — CBC
HCT: 38.2 % (ref 36.0–46.0)
Hemoglobin: 12.5 g/dL (ref 12.0–15.0)
MCH: 28.8 pg (ref 26.0–34.0)
MCHC: 32.7 g/dL (ref 30.0–36.0)
MCV: 88 fL (ref 80.0–100.0)
Platelets: 254 10*3/uL (ref 150–400)
RBC: 4.34 MIL/uL (ref 3.87–5.11)
RDW: 13.1 % (ref 11.5–15.5)
WBC: 7.7 10*3/uL (ref 4.0–10.5)
nRBC: 0 % (ref 0.0–0.2)

## 2020-03-01 MED ORDER — AMOXICILLIN-POT CLAVULANATE 500-125 MG PO TABS: 1.0000 | ORAL_TABLET | Freq: Two times a day (BID) | ORAL | 0 refills | Status: AC

## 2020-03-01 MED ORDER — ACETAMINOPHEN 500 MG PO TABS: 500.0000 mg | ORAL_TABLET | Freq: Three times a day (TID) | ORAL | 0 refills | Status: DC | PRN

## 2020-03-01 NOTE — Discharge Summary (Signed)
Physician Discharge Summary  Jordan Ibarra IRW:[id number] DOB: 1981/07/15 DOA: 02/28/2020  PCP: Alain Honey, MD  Admit date: 02/28/2020 Discharge date: 03/01/2020  Time spent: 35 minutes  Recommendations for Outpatient Follow-up:  1. PCP in 1 week   Discharge Diagnoses:  Active Problems:   Infected cat bite of lower leg, right, initial encounter    Right lower extremity cellulitis  Discharge Condition: Stable  Diet recommendation: Regular  Filed Weights   02/28/20 1119  Weight: 88.3 kg    History of present illness:   Jordan Ibarra a 39 y.o.femalewithno pastmedical historypresented to the ED today due to worsening right lower extremity pain. She reports that her cat bit her on the leg 4 days ago and she has since developed worsening erythema and pain in that leg.   Hospital Course:    Right calf, lower extremity cellulitis -Following cat bite on 6/19 -Multiple wounds with purulence,  treated with IV Unasyn, clinically improving, at this point not felt to need any I&D, received tetanus toxoid in the last 6 months -Discharged home in a stable condition on oral Augmentin for 4 more days   Consultations:  Orthopedics Dr. Susa Simmonds  Discharge Exam: Vitals:   02/29/20 2153 03/01/20 0534  BP: 125/77 116/79  Pulse: 61 64  Resp: 16 15  Temp: 97.7 F (36.5 C) 97.6 F (36.4 C)  SpO2: 96% 98%    General: AAOx3 Cardiovascular: S1-S2, regular rate rhythm Respiratory: Clear  Discharge Instructions    Allergies as of 03/01/2020      Reactions   Latex Rash      Medication List    STOP taking these medications   clotrimazole-betamethasone cream Commonly known as: LOTRISONE   cyclobenzaprine 10 MG tablet Commonly known as: FLEXERIL   dicyclomine 20 MG tablet Commonly known as: BENTYL   gabapentin 300 MG capsule Commonly known as: NEURONTIN     TAKE these medications   acetaminophen 500 MG tablet Commonly known as: TYLENOL Take 1 tablet  (500 mg total) by mouth every 8 (eight) hours as needed for mild pain, moderate pain or headache. What changed:   how much to take  Another medication with the same name was removed. Continue taking this medication, and follow the directions you see here.   amoxicillin-clavulanate 500-125 MG tablet Commonly known as: Augmentin Take 1 tablet (500 mg total) by mouth 2 (two) times daily for 4 days.      Allergies  Allergen Reactions  . Latex Rash    Follow-up Information    Alain Honey, MD. Schedule an appointment as soon as possible for a visit in 1 week(s).   Specialty: Family Medicine Contact information: [address] [place] [address] [phone]                The results of significant diagnostics from this hospitalization (including imaging, microbiology, ancillary and laboratory) are listed below for reference.    Significant Diagnostic Studies: No results found.  Microbiology: Recent Results (from the past 240 hour(s))  Aerobic/Anaerobic Culture (surgical/deep wound)     Status: None (Preliminary result)   Collection Time: 02/28/20 12:38 PM   Specimen: Leg; Abscess  Result Value Ref Range Status   Specimen Description   Final    LEG RIGHT Performed at [facility], [address] [place], [place] [address]    Special Requests   Final    NONE Performed at [facility], [address] [place], [place]  [address]    Gram Stain   Final    RARE WBC PRESENT,BOTH PMN AND MONONUCLEAR NO ORGANISMS SEEN    Culture   Final    FEW PASTEURELLA MULTOCIDA Usually susceptible to penicillin and other beta lactam agents,quinolones,macrolides and tetracyclines. Performed at [facility], [place] [address] [place], [place] [address]    Report Status PENDING  Incomplete  SARS Coronavirus 2 by RT PCR (hospital order, performed in [facility] hospital lab) Nasopharyngeal Nasopharyngeal Swab      Status: None   Collection Time: 02/28/20  2:08 PM   Specimen: Nasopharyngeal Swab  Result Value Ref Range Status   SARS Coronavirus 2 NEGATIVE NEGATIVE Final    Comment: (NOTE) SARS-CoV-2 target nucleic acids are NOT DETECTED.  The SARS-CoV-2 RNA is generally detectable in upper and lower respiratory specimens during the acute phase of infection. The lowest concentration of SARS-CoV-2 viral copies this assay can detect is 250 copies / mL. A negative result does not preclude SARS-CoV-2 infection and should not be used as the sole basis for treatment or other patient management decisions.  A negative result may occur with improper specimen collection / handling, submission of specimen other than nasopharyngeal swab, presence of viral mutation(s) within the areas targeted by this assay, and inadequate number of viral copies (<250 copies / mL). A negative result must be combined with clinical observations, patient history, and epidemiological information.  Fact Sheet for Patients:   StrictlyIdeas.no  Fact Sheet for Healthcare Providers: BankingDealers.co.za  This test is not yet approved or  cleared by the [place] FDA and has been authorized for detection and/or diagnosis of SARS-CoV-2 by FDA under an Emergency Use Authorization (EUA).  This EUA will remain in effect (meaning this test can be used) for the duration of the COVID-19 declaration under Section 564(b)(1) of the Act, 21 U.S.C. section 360bbb-3(b)(1), unless the authorization is terminated or revoked sooner.  Performed at [facility], [address] [place], [place] [address]      Labs: Basic Metabolic Panel: Recent Labs  Lab 02/28/20 1238 02/29/20 0810  NA 141 141  K 3.8 4.5  CL 104 105  CO2 28 24  GLUCOSE 93 110*  BUN 17 18  CREATININE 0.88 0.82  CALCIUM 9.2 8.9   Liver Function Tests: Recent Labs  Lab 02/28/20 1238  AST 13*   ALT 13  ALKPHOS 82  BILITOT 0.4  PROT 7.0  ALBUMIN 4.1   No results for input(s): LIPASE, AMYLASE in the last 168 hours. No results for input(s): AMMONIA in the last 168 hours. CBC: Recent Labs  Lab 02/28/20 1238 02/29/20 0810 03/01/20 0510  WBC 9.7 8.9 7.7  NEUTROABS 5.4  --   --   HGB 13.4 13.8 12.5  HCT 41.4 42.1 38.2  MCV 89.2 88.6 88.0  PLT 269 301 254   Cardiac Enzymes: No results for input(s): CKTOTAL, CKMB, CKMBINDEX, TROPONINI in the last 168 hours. BNP: BNP (last 3 results) No results for input(s): BNP in the last 8760 hours.  ProBNP (last 3 results) No results for input(s): PROBNP in the last 8760 hours.  CBG: No results for input(s): GLUCAP in the last 168 hours.     Signed:  Domenic Polite MD.  [facility] 03/01/2020, 10:23 AM

## 2020-03-01 NOTE — Progress Notes (Signed)
Discharge instructions were given to patient and family.  All questions were answered.  Patient was taken to main entrance by wheelchair.

## 2020-03-03 LAB — AEROBIC/ANAEROBIC CULTURE W GRAM STAIN (SURGICAL/DEEP WOUND)

## 2020-03-14 ENCOUNTER — Ambulatory Visit: Payer: HRSA Program | Attending: Internal Medicine

## 2020-03-14 DIAGNOSIS — Z20822 Contact with and (suspected) exposure to covid-19: Secondary | ICD-10-CM | POA: Diagnosis present

## 2020-03-15 LAB — NOVEL CORONAVIRUS, NAA: SARS-CoV-2, NAA: NOT DETECTED

## 2020-03-15 LAB — SARS-COV-2, NAA 2 DAY TAT

## 2020-07-16 ENCOUNTER — Encounter (HOSPITAL_COMMUNITY): Payer: Self-pay | Admitting: Obstetrics and Gynecology

## 2020-07-16 ENCOUNTER — Other Ambulatory Visit: Payer: Self-pay

## 2020-07-16 ENCOUNTER — Emergency Department (HOSPITAL_COMMUNITY)
Admission: EM | Admit: 2020-07-16 | Discharge: 2020-07-16 | Disposition: A | Discharge status: Home / Self Care | Payer: Self-pay | Attending: Emergency Medicine | Admitting: Emergency Medicine

## 2020-07-16 ENCOUNTER — Emergency Department (HOSPITAL_COMMUNITY): Payer: Self-pay

## 2020-07-16 DIAGNOSIS — R0602 Shortness of breath: Secondary | ICD-10-CM | POA: Insufficient documentation

## 2020-07-16 DIAGNOSIS — R109 Unspecified abdominal pain: Secondary | ICD-10-CM | POA: Insufficient documentation

## 2020-07-16 DIAGNOSIS — R0789 Other chest pain: Secondary | ICD-10-CM

## 2020-07-16 DIAGNOSIS — F17210 Nicotine dependence, cigarettes, uncomplicated: Secondary | ICD-10-CM | POA: Insufficient documentation

## 2020-07-16 LAB — URINALYSIS, ROUTINE W REFLEX MICROSCOPIC
Bilirubin Urine: NEGATIVE
Glucose, UA: NEGATIVE mg/dL
Hgb urine dipstick: NEGATIVE
Ketones, ur: 5 mg/dL — AB
Leukocytes,Ua: NEGATIVE
Nitrite: NEGATIVE
Protein, ur: NEGATIVE mg/dL
Specific Gravity, Urine: 1.027 (ref 1.005–1.030)
pH: 5 (ref 5.0–8.0)

## 2020-07-16 LAB — COMPREHENSIVE METABOLIC PANEL
ALT: 15 U/L (ref 0–44)
AST: 15 U/L (ref 15–41)
Albumin: 4.4 g/dL (ref 3.5–5.0)
Alkaline Phosphatase: 78 U/L (ref 38–126)
Anion gap: 6 (ref 5–15)
BUN: 21 mg/dL — ABNORMAL HIGH (ref 6–20)
CO2: 30 mmol/L (ref 22–32)
Calcium: 9.4 mg/dL (ref 8.9–10.3)
Chloride: 104 mmol/L (ref 98–111)
Creatinine, Ser: 0.85 mg/dL (ref 0.44–1.00)
GFR, Estimated: >60 mL/min (ref >60)
Glucose, Bld: 97 mg/dL (ref 70–99)
Potassium: 4.2 mmol/L (ref 3.5–5.1)
Sodium: 140 mmol/L (ref 135–145)
Total Bilirubin: 0.5 mg/dL (ref 0.3–1.2)
Total Protein: 7.2 g/dL (ref 6.5–8.1)

## 2020-07-16 LAB — CBC
HCT: 43.5 % (ref 36.0–46.0)
Hemoglobin: 14.3 g/dL (ref 12.0–15.0)
MCH: 29 pg (ref 26.0–34.0)
MCHC: 32.9 g/dL (ref 30.0–36.0)
MCV: 88.2 fL (ref 80.0–100.0)
Platelets: 277 10*3/uL (ref 150–400)
RBC: 4.93 MIL/uL (ref 3.87–5.11)
RDW: 13 % (ref 11.5–15.5)
WBC: 10.8 10*3/uL — ABNORMAL HIGH (ref 4.0–10.5)
nRBC: 0 % (ref 0.0–0.2)

## 2020-07-16 LAB — LIPASE, BLOOD: Lipase: 32 U/L (ref 11–51)

## 2020-07-16 MED ORDER — IBUPROFEN 400 MG PO TABS
400.0000 mg | ORAL_TABLET | Freq: Four times a day (QID) | ORAL | 0 refills | Status: DC | PRN
Start: 2020-07-16 — End: 2020-08-23

## 2020-07-16 MED ORDER — METHOCARBAMOL 750 MG PO TABS
750.0000 mg | ORAL_TABLET | Freq: Four times a day (QID) | ORAL | 0 refills | Status: DC
Start: 2020-07-16 — End: 2022-01-11

## 2020-07-16 MED ORDER — KETOROLAC TROMETHAMINE 60 MG/2ML IM SOLN
60.0000 mg | Freq: Once | INTRAMUSCULAR | Status: AC
  Administered 2020-07-16: 60 mg via INTRAMUSCULAR
  Filled 2020-07-16: qty 2

## 2020-07-16 NOTE — ED Triage Notes (Signed)
Patient reports LUQ abdominal pain. Patient reports the pain started today and moving and breathing makes the pain worse.  Patient reports as long as she is still the pain in manageable. Patient reports she has not tried to eat. Patient reports las BM today and was normal.

## 2020-07-16 NOTE — ED Provider Notes (Signed)
[facility]-EMERGENCY DEPT Provider Note   CSN: [id number] Arrival date & time: 07/16/20  1809     History Chief Complaint  Patient presents with  . Abdominal Pain    Jordan Ibarra is a 39 y.o. female.  39 year old female presents with left sided sharp chest pain that worse with movement.  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  She denies any cough or congestion.  Pain is better with remaining still.  Took Tylenol with limited relief.  No prior history of same        Past Medical History:  Diagnosis Date  . GERD (gastroesophageal reflux disease)    otc  Tums    Patient Active Problem List   Diagnosis Date Noted  . Infected cat bite of lower leg, right, initial encounter 02/28/2020  . Cervical radiculopathy 01/22/2016  . Bilateral hand pain 11/15/2015  . CAP (community acquired pneumonia) 11/15/2015  . Chest wall pain 04/17/2015    Past Surgical History:  Procedure Laterality Date  . ABDOMINAL HYSTERECTOMY    . ANTERIOR CERVICAL DECOMP/DISCECTOMY FUSION N/A 01/22/2016   Procedure: Cervical six - seven Anterior cervical discectomy with fusion and plate fixation;  Surgeon: Loura Halt Ditty, MD;  Location: [facility];  Service: Neurosurgery;  Laterality: N/A;  C6-7 Anterior cervical discectomy with fusion and plate fixation  . APPENDECTOMY    . bladder prolapse surgery    . TONSILLECTOMY       OB History   No obstetric history on file.     Family History  Problem Relation Age of Onset  . Kidney failure Mother   . Scoliosis Mother   . Ovarian cancer Sister   . Lung cancer Maternal Grandmother     Social History   Tobacco Use  . Smoking status: Current Every Day Smoker    Packs/day: 0.15    Years: 22.00    Pack years: 3.30    Types: Cigarettes  . Smokeless tobacco: Never Used  Vaping Use  . Vaping Use: Never used  Substance Use Topics  . Alcohol use: Yes    Comment: "Not that often"  .  Drug use: No    Comment: none in 10 years    Home Medications Prior to Admission medications   Medication Sig Start Date End Date Taking? Authorizing Provider  acetaminophen (TYLENOL) 500 MG tablet Take 1 tablet (500 mg total) by mouth every 8 (eight) hours as needed for mild pain, moderate pain or headache. 03/01/20   Zannie Cove, MD    Allergies    Latex  Review of Systems   Review of Systems  All other systems reviewed and are negative.   Physical Exam Updated Vital Signs BP 125/77   Pulse 79   Temp 98.2 F (36.8 C) (Oral)   Resp 18   Ht 1.651 m (5\' 5" )   Wt 83 kg   SpO2 99%   BMI 30.45 kg/m   Physical Exam Vitals and nursing note reviewed.  Constitutional:      General: She is not in acute distress.    Appearance: Normal appearance. She is well-developed. She is not toxic-appearing.  HENT:     Head: Normocephalic and atraumatic.  Eyes:     General: Lids are normal.     Conjunctiva/sclera: Conjunctivae normal.     Pupils: Pupils are equal, round, and reactive to light.  Neck:     Thyroid: No thyroid mass.  Trachea: No tracheal deviation.  Cardiovascular:     Rate and Rhythm: Normal rate and regular rhythm.     Heart sounds: Normal heart sounds. No murmur heard.  No gallop.   Pulmonary:     Effort: Pulmonary effort is normal. No respiratory distress.     Breath sounds: Normal breath sounds. No stridor. No decreased breath sounds, wheezing, rhonchi or rales.  Chest:    Abdominal:     General: Bowel sounds are normal. There is no distension.     Palpations: Abdomen is soft.     Tenderness: There is no abdominal tenderness. There is no rebound.  Musculoskeletal:        General: No tenderness. Normal range of motion.     Cervical back: Normal range of motion and neck supple.  Skin:    General: Skin is warm and dry.     Findings: No abrasion or rash.  Neurological:     Mental Status: She is alert and oriented to person, place, and time.     GCS:  GCS eye subscore is 4. GCS verbal subscore is 5. GCS motor subscore is 6.     Cranial Nerves: No cranial nerve deficit.     Sensory: No sensory deficit.  Psychiatric:        Speech: Speech normal.        Behavior: Behavior normal.     ED Results / Procedures / Treatments   Labs (all labs ordered are listed, but only abnormal results are displayed) Labs Reviewed  COMPREHENSIVE METABOLIC PANEL - Abnormal; Notable for the following components:      Result Value   BUN 21 (*)    All other components within normal limits  CBC - Abnormal; Notable for the following components:   WBC 10.8 (*)    All other components within normal limits  LIPASE, BLOOD  URINALYSIS, ROUTINE W REFLEX MICROSCOPIC    EKG None  Radiology No results found.  Procedures Procedures (including critical care time)  Medications Ordered in ED Medications  ketorolac (TORADOL) injection 60 mg (has no administration in time range)    ED Course  I have reviewed the triage vital signs and the nursing notes.  Pertinent labs & imaging results that were available during my care of the patient were reviewed by me and considered in my medical decision making (see chart for details).    MDM Rules/Calculators/A&P                          Patient given Toradol for pain and feels better.  Chest x-ray without acute findings.  Suspect musculoskeletal pain.  Will place on muscle relaxants and anti-inflammatories. Final Clinical Impression(s) / ED Diagnoses Final diagnoses:  SOB (shortness of breath)    Rx / DC Orders ED Discharge Orders    None       Lorre Nick, MD 07/16/20 2158

## 2020-07-16 NOTE — ED Notes (Signed)
An After Visit Summary was printed and given to the patient. Discharge instructions given and no further questions at this time.  

## 2020-08-23 ENCOUNTER — Other Ambulatory Visit: Payer: Self-pay

## 2020-08-23 ENCOUNTER — Encounter (HOSPITAL_BASED_OUTPATIENT_CLINIC_OR_DEPARTMENT_OTHER): Payer: Self-pay | Admitting: Emergency Medicine

## 2020-08-23 ENCOUNTER — Emergency Department (HOSPITAL_BASED_OUTPATIENT_CLINIC_OR_DEPARTMENT_OTHER)
Admission: EM | Admit: 2020-08-23 | Discharge: 2020-08-23 | Disposition: A | Discharge status: Home / Self Care | Payer: Self-pay | Attending: Emergency Medicine | Admitting: Emergency Medicine

## 2020-08-23 ENCOUNTER — Emergency Department (HOSPITAL_BASED_OUTPATIENT_CLINIC_OR_DEPARTMENT_OTHER): Payer: Self-pay

## 2020-08-23 DIAGNOSIS — R079 Chest pain, unspecified: Secondary | ICD-10-CM

## 2020-08-23 DIAGNOSIS — F17210 Nicotine dependence, cigarettes, uncomplicated: Secondary | ICD-10-CM | POA: Insufficient documentation

## 2020-08-23 DIAGNOSIS — R0602 Shortness of breath: Secondary | ICD-10-CM | POA: Insufficient documentation

## 2020-08-23 DIAGNOSIS — R091 Pleurisy: Secondary | ICD-10-CM | POA: Insufficient documentation

## 2020-08-23 DIAGNOSIS — Z91040 Latex allergy status: Secondary | ICD-10-CM | POA: Insufficient documentation

## 2020-08-23 DIAGNOSIS — Z20822 Contact with and (suspected) exposure to covid-19: Secondary | ICD-10-CM | POA: Insufficient documentation

## 2020-08-23 LAB — CBC
HCT: 42 % (ref 36.0–46.0)
Hemoglobin: 14 g/dL (ref 12.0–15.0)
MCH: 28.8 pg (ref 26.0–34.0)
MCHC: 33.3 g/dL (ref 30.0–36.0)
MCV: 86.4 fL (ref 80.0–100.0)
Platelets: 294 10*3/uL (ref 150–400)
RBC: 4.86 MIL/uL (ref 3.87–5.11)
RDW: 13.2 % (ref 11.5–15.5)
WBC: 8.5 10*3/uL (ref 4.0–10.5)
nRBC: 0 % (ref 0.0–0.2)

## 2020-08-23 LAB — BASIC METABOLIC PANEL
Anion gap: 11 (ref 5–15)
BUN: 20 mg/dL (ref 6–20)
CO2: 24 mmol/L (ref 22–32)
Calcium: 9.1 mg/dL (ref 8.9–10.3)
Chloride: 104 mmol/L (ref 98–111)
Creatinine, Ser: 0.7 mg/dL (ref 0.44–1.00)
GFR, Estimated: >60 mL/min (ref >60)
Glucose, Bld: 97 mg/dL (ref 70–99)
Potassium: 3.7 mmol/L (ref 3.5–5.1)
Sodium: 139 mmol/L (ref 135–145)

## 2020-08-23 LAB — RESP PANEL BY RT-PCR (FLU A&B, COVID) ARPGX2
Influenza A by PCR: NEGATIVE
Influenza B by PCR: NEGATIVE
SARS Coronavirus 2 by RT PCR: NEGATIVE

## 2020-08-23 LAB — TROPONIN I (HIGH SENSITIVITY): Troponin I (High Sensitivity): <2 ng/L (ref <18)

## 2020-08-23 MED ORDER — PREDNISONE 10 MG PO TABS: 50.0000 mg | ORAL_TABLET | Freq: Every day | ORAL | 0 refills | Status: DC

## 2020-08-23 MED ORDER — IBUPROFEN 400 MG PO TABS: 400.0000 mg | ORAL_TABLET | Freq: Three times a day (TID) | ORAL | 0 refills | Status: DC | PRN

## 2020-08-23 MED ORDER — ALBUTEROL SULFATE HFA 108 (90 BASE) MCG/ACT IN AERS
4.0000 | INHALATION_SPRAY | Freq: Once | RESPIRATORY_TRACT | Status: AC
  Administered 2020-08-23: 13:00:00 4 via RESPIRATORY_TRACT
  Filled 2020-08-23: qty 6.7

## 2020-08-23 NOTE — ED Triage Notes (Signed)
Chest pain for one month.  Seen for same on 07/16/2020.  Pt states the pain worsened last night, pain increases with deep breath and movement.   Denies recent travel.  Possible covid exposure at work.

## 2020-08-23 NOTE — ED Notes (Signed)
Albuterol Inhaler 4 puff per EDP orders provided, use of Aero Chamber with mask also used. Pt has strong insp with good SMI noted. Tolerated very well

## 2020-08-23 NOTE — ED Provider Notes (Addendum)
[facility] EMERGENCY DEPARTMENT Provider Note   CSN: [id number] Arrival date & time: 08/23/20  1103     History Chief Complaint  Patient presents with  . Chest Pain    Jordan Ibarra is a 39 y.o. female.  HPI    39 year old female history of GERD comes in a chief complaint of chest pain. Pt reports that she was seen in the ER for chest pain a month ago.  Since then she has had some intermittent chest discomfort, however this morning she had more severe chest pain with shortness of breath.  The chest pain is generalized, worse on the right side.  He is having hard time taking a deep breath in and feels tight in her chest.  She has no history of asthma, COPD.  Pain is nonradiating.  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 Pt has no hx of PE, DVT and denies any exogenous hormone (testosterone / estrogen) use, long distance travels or surgery in the past 6 weeks, active cancer, recent immobilization.   Past Medical History:  Diagnosis Date  . GERD (gastroesophageal reflux disease)    otc  Tums    Patient Active Problem List   Diagnosis Date Noted  . Infected cat bite of lower leg, right, initial encounter 02/28/2020  . Cervical radiculopathy 01/22/2016  . Bilateral hand pain 11/15/2015  . CAP (community acquired pneumonia) 11/15/2015  . Chest wall pain 04/17/2015    Past Surgical History:  Procedure Laterality Date  . ABDOMINAL HYSTERECTOMY    . ANTERIOR CERVICAL DECOMP/DISCECTOMY FUSION N/A 01/22/2016   Procedure: Cervical six - seven Anterior cervical discectomy with fusion and plate fixation;  Surgeon: Loura Halt Ditty, MD;  Location: [facility];  Service: Neurosurgery;  Laterality: N/A;  C6-7 Anterior cervical discectomy with fusion and plate fixation  . APPENDECTOMY    . bladder prolapse surgery    . TONSILLECTOMY       OB History   No obstetric history on file.     Family History  Problem  Relation Age of Onset  . Kidney failure Mother   . Scoliosis Mother   . Ovarian cancer Sister   . Lung cancer Maternal Grandmother     Social History   Tobacco Use  . Smoking status: Current Every Day Smoker    Packs/day: 0.15    Years: 22.00    Pack years: 3.30    Types: Cigarettes  . Smokeless tobacco: Never Used  Vaping Use  . Vaping Use: Never used  Substance Use Topics  . Alcohol use: Yes    Comment: "Not that often"  . Drug use: No    Comment: none in 10 years    Home Medications Prior to Admission medications   Medication Sig Start Date End Date Taking? Authorizing Provider  acetaminophen (TYLENOL) 500 MG tablet Take 1 tablet (500 mg total) by mouth every 8 (eight) hours as needed for mild pain, moderate pain or headache. 03/01/20   Zannie Cove, MD  ibuprofen (ADVIL) 400 MG tablet Take 1 tablet (400 mg total) by mouth every 8 (eight) hours as needed. 08/23/20   Derwood Kaplan, MD  methocarbamol (ROBAXIN-750) 750 MG tablet Take 1 tablet (750 mg total) by mouth 4 (four) times daily. 07/16/20   Lorre Nick, MD  predniSONE (DELTASONE) 10 MG tablet Take 5 tablets (50 mg total) by mouth daily. 08/23/20   Derwood Kaplan, MD    Allergies  Latex  Review of Systems   Review of Systems  Constitutional: Positive for activity change.  Respiratory: Positive for chest tightness and shortness of breath. Negative for wheezing.   Cardiovascular: Positive for chest pain.  Allergic/Immunologic: Negative for immunocompromised state.  Hematological: Does not bruise/bleed easily.  All other systems reviewed and are negative.   Physical Exam Updated Vital Signs BP 116/64 (BP Location: Left Arm) Comment: TAKEN AT 14:00  Pulse 78   Temp (!) 97.5 F (36.4 C) (Oral)   Resp (!) 21   Ht 5\' 5"  (1.651 m)   Wt 81.6 kg   SpO2 96%   BMI 29.95 kg/m   Physical Exam Vitals and nursing note reviewed.  Constitutional:      Appearance: She is well-developed.  HENT:     Head:  Normocephalic and atraumatic.  Eyes:     Extraocular Movements: EOM normal.  Cardiovascular:     Rate and Rhythm: Normal rate.  Pulmonary:     Effort: Pulmonary effort is normal.  Abdominal:     General: Bowel sounds are normal.  Musculoskeletal:     Cervical back: Normal range of motion and neck supple.  Skin:    General: Skin is warm and dry.  Neurological:     Mental Status: She is alert and oriented to person, place, and time.     ED Results / Procedures / Treatments   Labs (all labs ordered are listed, but only abnormal results are displayed) Labs Reviewed  RESP PANEL BY RT-PCR (FLU A&B, COVID) [id number]  BASIC METABOLIC PANEL  CBC  TROPONIN I (HIGH SENSITIVITY)  TROPONIN I (HIGH SENSITIVITY)    EKG EKG Interpretation  Date/Time:  Thursday August 23 2020 11:47:40 EST Ventricular Rate:  63 PR Interval:    QRS Duration: 111 QT Interval:  454 QTC Calculation: 465 R Axis:   102 Text Interpretation: Sinus rhythm Consider right ventricular hypertrophy Nonspecific T abnrm, anterolateral leads No acute changes No significant change since last tracing Confirmed by [id number] [phone]) on 08/23/2020 11:57:18 AM   Radiology DG Chest Portable 1 View  Result Date: 08/23/2020 CLINICAL DATA:  Chest pain for 1 month worse in last night, pain increased with deep breath and movement, history GERD, smoker EXAM: PORTABLE CHEST 1 VIEW COMPARISON:  Portable exam 1122 hours compared to 07/16/2020 FINDINGS: Normal heart size, mediastinal contours, and pulmonary vascularity. Chronic elevation of RIGHT diaphragm with RIGHT basilar atelectasis. Remaining lungs clear. No infiltrate, pleural effusion or pneumothorax. Prior cervical spine fusion. IMPRESSION: Chronic elevation of RIGHT diaphragm with mild RIGHT basilar atelectasis. No acute abnormalities. Electronically Signed   By: 13/04/2020 M.D.   On: 08/23/2020 11:36    Procedures Procedures (including critical care  time)  Medications Ordered in ED Medications  albuterol (VENTOLIN HFA) 108 (90 Base) MCG/ACT inhaler 4 puff (4 puffs Inhalation Given 08/23/20 1319)    ED Course  I have reviewed the triage vital signs and the nursing notes.  Pertinent labs & imaging results that were available during my care of the patient were reviewed by me and considered in my medical decision making (see chart for details).  Clinical Course as of 08/23/20 1441  Thu Aug 23, 2020  1441 The patient appears reasonably screened and/or stabilized for discharge and I doubt any other medical condition or other [facility] requiring further screening, evaluation, or treatment in the ED at this time prior to discharge.  Results from the ER workup discussed with the patient face to face and all  questions answered to the best of my ability. The patient is safe for discharge with strict return precautions.  X-rays negative. Covid negative. [AN]    Clinical Course User Index [AN] Derwood Kaplan, MD   MDM Rules/Calculators/A&P                         DARCUS EDDS was evaluated in Emergency Department on 08/23/2020 for the symptoms described in the history of present illness. She was evaluated in the context of the global COVID-19 pandemic, which necessitated consideration that the patient might be at risk for infection with the SARS-CoV-2 virus that causes COVID-19. Institutional protocols and algorithms that pertain to the evaluation of patients at risk for COVID-19 are in a state of rapid change based on information released by regulatory bodies including the CDC and federal and state organizations. These policies and algorithms were followed during the patient's care in the ED.    Differential diagnosis includes: ACS syndrome Aortic dissection Myocarditis Pericarditis Endocarditis Pneumonia Pleural effusion / Pulmonary edema PE Pneumothorax Musculoskeletal pain PUD / Gastritis / Esophagitis Esophageal spasm  Cc:  chest pain. Not pleuritic, but there is some dyspnea and hard to take a deep breath. Not positional. EKG is reassuring. CXR and it is neg.  Low suspicion for acs. Wells score for PE is low and PERC neg. Esophageal spasm? Pleurisy?  [place] trops ordered. covid test ordered. broncospasms for treatment. Will reassess. Anticipate d/c.   Final Clinical Impression(s) / ED Diagnoses Final diagnoses:  Pleurisy  Nonspecific chest pain    Rx / DC Orders ED Discharge Orders         Ordered    predniSONE (DELTASONE) 10 MG tablet  Daily        08/23/20 1440    ibuprofen (ADVIL) 400 MG tablet  Every 8 hours PRN        08/23/20 1440           Derwood Kaplan, MD 08/23/20 1441

## 2020-08-23 NOTE — Discharge Instructions (Addendum)
All the results in the ER are normal, labs and imaging. We are not sure what is causing your symptoms. The workup in the ER is not complete, and is limited to screening for life threatening and emergent conditions only, so please see a primary care doctor for further evaluation.  Take the inhaler every 4-6 hours for chest tightness or shortness of breath. Take the prednisone as prescribed.  Your Covid test today was negative.

## 2021-06-15 ENCOUNTER — Emergency Department (HOSPITAL_COMMUNITY)
Admission: EM | Admit: 2021-06-15 | Discharge: 2021-06-16 | Disposition: A | Discharge status: Home / Self Care | Payer: Self-pay | Attending: Emergency Medicine | Admitting: Emergency Medicine

## 2021-06-15 ENCOUNTER — Other Ambulatory Visit: Payer: Self-pay

## 2021-06-15 ENCOUNTER — Encounter (HOSPITAL_COMMUNITY): Payer: Self-pay | Admitting: Emergency Medicine

## 2021-06-15 DIAGNOSIS — F17210 Nicotine dependence, cigarettes, uncomplicated: Secondary | ICD-10-CM | POA: Insufficient documentation

## 2021-06-15 DIAGNOSIS — R310 Gross hematuria: Secondary | ICD-10-CM | POA: Insufficient documentation

## 2021-06-15 DIAGNOSIS — R1031 Right lower quadrant pain: Secondary | ICD-10-CM | POA: Insufficient documentation

## 2021-06-15 DIAGNOSIS — K5730 Diverticulosis of large intestine without perforation or abscess without bleeding: Secondary | ICD-10-CM | POA: Insufficient documentation

## 2021-06-15 DIAGNOSIS — E669 Obesity, unspecified: Secondary | ICD-10-CM | POA: Insufficient documentation

## 2021-06-15 LAB — URINALYSIS, ROUTINE W REFLEX MICROSCOPIC
Bacteria, UA: NONE SEEN
Bilirubin Urine: NEGATIVE
Glucose, UA: NEGATIVE mg/dL
Ketones, ur: NEGATIVE mg/dL
Leukocytes,Ua: NEGATIVE
Nitrite: NEGATIVE
Protein, ur: NEGATIVE mg/dL
Specific Gravity, Urine: 1.017 (ref 1.005–1.030)
pH: 5 (ref 5.0–8.0)

## 2021-06-15 LAB — CBC WITH DIFFERENTIAL/PLATELET
Abs Immature Granulocytes: 0.03 10*3/uL (ref 0.00–0.07)
Basophils Absolute: 0.1 10*3/uL (ref 0.0–0.1)
Basophils Relative: 1 %
Eosinophils Absolute: 0.3 10*3/uL (ref 0.0–0.5)
Eosinophils Relative: 4 %
HCT: 44.5 % (ref 36.0–46.0)
Hemoglobin: 14.5 g/dL (ref 12.0–15.0)
Immature Granulocytes: 0 %
Lymphocytes Relative: 28 %
Lymphs Abs: 2.5 10*3/uL (ref 0.7–4.0)
MCH: 29 pg (ref 26.0–34.0)
MCHC: 32.6 g/dL (ref 30.0–36.0)
MCV: 89 fL (ref 80.0–100.0)
Monocytes Absolute: 0.5 10*3/uL (ref 0.1–1.0)
Monocytes Relative: 6 %
Neutro Abs: 5.7 10*3/uL (ref 1.7–7.7)
Neutrophils Relative %: 61 %
Platelets: 289 10*3/uL (ref 150–400)
RBC: 5 MIL/uL (ref 3.87–5.11)
RDW: 13.1 % (ref 11.5–15.5)
WBC: 9.2 10*3/uL (ref 4.0–10.5)
nRBC: 0 % (ref 0.0–0.2)

## 2021-06-15 LAB — COMPREHENSIVE METABOLIC PANEL
ALT: 16 U/L (ref 0–44)
AST: 18 U/L (ref 15–41)
Albumin: 4.5 g/dL (ref 3.5–5.0)
Alkaline Phosphatase: 83 U/L (ref 38–126)
Anion gap: 7 (ref 5–15)
BUN: 18 mg/dL (ref 6–20)
CO2: 28 mmol/L (ref 22–32)
Calcium: 9.8 mg/dL (ref 8.9–10.3)
Chloride: 109 mmol/L (ref 98–111)
Creatinine, Ser: 0.83 mg/dL (ref 0.44–1.00)
GFR, Estimated: >60 mL/min (ref >60)
Glucose, Bld: 103 mg/dL — ABNORMAL HIGH (ref 70–99)
Potassium: 4 mmol/L (ref 3.5–5.1)
Sodium: 144 mmol/L (ref 135–145)
Total Bilirubin: 0.2 mg/dL — ABNORMAL LOW (ref 0.3–1.2)
Total Protein: 7.5 g/dL (ref 6.5–8.1)

## 2021-06-15 LAB — LIPASE, BLOOD: Lipase: 73 U/L — ABNORMAL HIGH (ref 11–51)

## 2021-06-15 NOTE — ED Provider Notes (Signed)
Emergency Medicine Provider Triage Evaluation Note  Jordan Ibarra , a 40 y.o. female  was evaluated in triage.  Pt complains of abd pain.  Review of Systems  Positive: Abd pain, nausea, vaginal bleeding Negative: Fever, cough, sob, dysuria  Physical Exam  Ht 5\' 5"  (1.651 m)   Wt 80.7 kg   BMI 29.62 kg/m  Gen:   Awake, no distress   Resp:  Normal effort  MSK:   Moves extremities without difficulty  Other:  Mild diffused abd tenderness  Medical Decision Making  Medically screening exam initiated at 8:43 PM.  Appropriate orders placed.  Jordan Ibarra was informed that the remainder of the evaluation will be completed by another provider, this initial triage assessment does not replace that evaluation, and the importance of remaining in the ED until their evaluation is complete.  Pt with hx of CA s/p hysterectomy who noticed trace vaginal bleeding when she urinate today.  Also endorse abd discomfort, nausea, and vomited earlier today.     Nikki Dom, PA-C 06/15/21 2054    2055, MD 06/17/21 [phone]

## 2021-06-15 NOTE — ED Triage Notes (Signed)
Pt c/o n/v and vaginal spotting since earlier today. Pt states when she vomited, it was yellow and felt "acidy" Hx of hysterectomy (2013) and Gerd.

## 2021-06-16 ENCOUNTER — Encounter (HOSPITAL_COMMUNITY): Payer: Self-pay

## 2021-06-16 ENCOUNTER — Emergency Department (HOSPITAL_COMMUNITY): Payer: Self-pay

## 2021-06-16 MED ORDER — IOHEXOL 350 MG/ML SOLN
80.0000 mL | Freq: Once | INTRAVENOUS | Status: AC | PRN
  Administered 2021-06-16: 80 mL via INTRAVENOUS

## 2021-06-16 MED ORDER — KETOROLAC TROMETHAMINE 30 MG/ML IJ SOLN
30.0000 mg | Freq: Once | INTRAMUSCULAR | Status: AC
  Administered 2021-06-16: 30 mg via INTRAVENOUS
  Filled 2021-06-16: qty 1

## 2021-06-16 NOTE — Discharge Instructions (Addendum)
You were seen today for blood in your urine.  Your CT scan does not show an obvious abnormality; however, given your urinalysis I suspect you may have passed a kidney stone.  Monitor closely.  If you have any new or worsening symptoms, you should be reevaluated.

## 2021-06-16 NOTE — ED Provider Notes (Signed)
[facility]-EMERGENCY DEPT Provider Note   CSN: [id number] Arrival date & time: 06/15/21  2006     History Chief Complaint  Patient presents with   Abdominal Pain   Vaginal Bleeding    Jordan Ibarra is a 40 y.o. female.  HPI     This a 40 year old female with a history of reflux, hysterectomy who presents with hematuria.  Patient reports that she has noted blood when she wipes after she urinated x2 today.  She states that she took a nap and woke up nauseous and has had some right lower quadrant abdominal discomfort.  She rates her pain at 6 out of 10.  No flank pain.  No dysuria or fevers.  She never had a history of kidney stones before.  Past Medical History:  Diagnosis Date   GERD (gastroesophageal reflux disease)    otc  Tums    Patient Active Problem List   Diagnosis Date Noted   Infected cat bite of lower leg, right, initial encounter 02/28/2020   Cervical radiculopathy 01/22/2016   Bilateral hand pain 11/15/2015   CAP (community acquired pneumonia) 11/15/2015   Chest wall pain 04/17/2015    Past Surgical History:  Procedure Laterality Date   ABDOMINAL HYSTERECTOMY     ANTERIOR CERVICAL DECOMP/DISCECTOMY FUSION N/A 01/22/2016   Procedure: Cervical six - seven Anterior cervical discectomy with fusion and plate fixation;  Surgeon: Loura Halt Ditty, MD;  Location: [facility];  Service: Neurosurgery;  Laterality: N/A;  C6-7 Anterior cervical discectomy with fusion and plate fixation   APPENDECTOMY     bladder prolapse surgery     TONSILLECTOMY       OB History   No obstetric history on file.     Family History  Problem Relation Age of Onset   Kidney failure Mother    Scoliosis Mother    Ovarian cancer Sister    Lung cancer Maternal Grandmother     Social History   Tobacco Use   Smoking status: Every Day    Packs/day: 0.15    Years: 22.00    Pack years: 3.30    Types: Cigarettes   Smokeless tobacco: Never  Vaping Use    Vaping Use: Never used  Substance Use Topics   Alcohol use: Yes    Comment: "Not that often"   Drug use: No    Comment: none in 10 years    Home Medications Prior to Admission medications   Medication Sig Start Date End Date Taking? Authorizing Provider  acetaminophen (TYLENOL) 500 MG tablet Take 1 tablet (500 mg total) by mouth every 8 (eight) hours as needed for mild pain, moderate pain or headache. 03/01/20  Yes Zannie Cove, MD  ibuprofen (ADVIL) 400 MG tablet Take 1 tablet (400 mg total) by mouth every 8 (eight) hours as needed. Patient not taking: Reported on 06/16/2021 08/23/20   Derwood Kaplan, MD  methocarbamol (ROBAXIN-750) 750 MG tablet Take 1 tablet (750 mg total) by mouth 4 (four) times daily. Patient not taking: Reported on 06/16/2021 07/16/20   Lorre Nick, MD  predniSONE (DELTASONE) 10 MG tablet Take 5 tablets (50 mg total) by mouth daily. Patient not taking: Reported on 06/16/2021 08/23/20   Derwood Kaplan, MD    Allergies    Latex  Review of Systems   Review of Systems  Respiratory:  Negative for shortness of breath.   Cardiovascular:  Negative for chest pain.  Gastrointestinal:  Positive for abdominal pain, nausea and vomiting.  Genitourinary:  Positive for hematuria. Negative for dysuria and flank pain.  All other systems reviewed and are negative.  Physical Exam Updated Vital Signs BP (!) 142/88   Pulse 66   Temp 98 F (36.7 C) (Oral)   Resp 19   Ht 1.651 m (5\' 5" )   Wt 80.7 kg   SpO2 99%   BMI 29.62 kg/m   Physical Exam Vitals and nursing note reviewed.  Constitutional:      Appearance: She is well-developed. She is obese. She is not ill-appearing.  HENT:     Head: Normocephalic and atraumatic.     Mouth/Throat:     Mouth: Mucous membranes are moist.  Eyes:     Pupils: Pupils are equal, round, and reactive to light.  Cardiovascular:     Rate and Rhythm: Normal rate and regular rhythm.     Heart sounds: Normal heart sounds.   Pulmonary:     Effort: Pulmonary effort is normal. No respiratory distress.     Breath sounds: No wheezing.  Abdominal:     General: Bowel sounds are normal.     Palpations: Abdomen is soft.     Tenderness: There is no abdominal tenderness.  Musculoskeletal:     Cervical back: Neck supple.  Skin:    General: Skin is warm and dry.  Neurological:     Mental Status: She is alert and oriented to person, place, and time.  Psychiatric:        Mood and Affect: Mood normal.    ED Results / Procedures / Treatments   Labs (all labs ordered are listed, but only abnormal results are displayed) Labs Reviewed  COMPREHENSIVE METABOLIC PANEL - Abnormal; Notable for the following components:      Result Value   Glucose, Bld 103 (*)    Total Bilirubin 0.2 (*)    All other components within normal limits  LIPASE, BLOOD - Abnormal; Notable for the following components:   Lipase 73 (*)    All other components within normal limits  URINALYSIS, ROUTINE W REFLEX MICROSCOPIC - Abnormal; Notable for the following components:   Hgb urine dipstick SMALL (*)    Crystals PRESENT (*)    All other components within normal limits  CBC WITH DIFFERENTIAL/PLATELET    EKG None  Radiology CT Abdomen Pelvis W Contrast  Result Date: 06/16/2021 CLINICAL DATA:  Abdominal pain, acute, nonlocalized hx CA, vaginal bleeding. Vomiting. EXAM: CT ABDOMEN AND PELVIS WITH CONTRAST TECHNIQUE: Multidetector CT imaging of the abdomen and pelvis was performed using the standard protocol following bolus administration of intravenous contrast. CONTRAST:  46mL OMNIPAQUE IOHEXOL 350 MG/ML SOLN COMPARISON:  05/19/2012 FINDINGS: Lower chest: No acute abnormality. Hepatobiliary: No focal liver abnormality is seen. No gallstones, gallbladder wall thickening, or biliary dilatation. Pancreas: Unremarkable Spleen: Unremarkable Adrenals/Urinary Tract: Adrenal glands are unremarkable. Kidneys are normal, without renal calculi, focal  lesion, or hydronephrosis. Bladder is unremarkable. Stomach/Bowel: Moderate sigmoid diverticulosis. The stomach, small bowel, and large bowel are otherwise unremarkable. No evidence of obstruction or focal inflammation. Appendectomy has been performed. No free intraperitoneal gas or fluid. Vascular/Lymphatic: No significant vascular findings are present. No enlarged abdominal or pelvic lymph nodes. Reproductive: Status post hysterectomy. No adnexal masses. Other: No abdominal wall hernia. Musculoskeletal: No acute or significant osseous findings. IMPRESSION: No acute intra-abdominal pathology identified. No definite radiographic explanation for the patient's reported symptoms. Moderate sigmoid diverticulosis without superimposed acute inflammatory change. Electronically Signed   By: 07/19/2012 M.D.   On: 06/16/2021 03:30  Procedures Procedures   Medications Ordered in ED Medications  iohexol (OMNIPAQUE) 350 MG/ML injection 80 mL (80 mLs Intravenous Contrast Given 06/16/21 0310)  ketorolac (TORADOL) 30 MG/ML injection 30 mg (30 mg Intravenous Given 06/16/21 0345)    ED Course  I have reviewed the triage vital signs and the nursing notes.  Pertinent labs & imaging results that were available during my care of the patient were reviewed by me and considered in my medical decision making (see chart for details).    MDM Rules/Calculators/A&P                           Patient presents with hematuria.  Also reports some abdominal discomfort and vomiting.  She is nontoxic and vital signs are reassuring.  Labs reviewed from triage.  Largely unremarkable with exception of urinalysis which shows hemoglobin in her urine.  Initially, patient thought she may be bleeding from her vagina.  She has a history of a full hysterectomy.  I highly suspect that this is hematuria and not vaginal given that it is only when she wipes and denies any recent trauma or sexual intercourse.  Her urine does have calcium  oxalate crystals.  Given abdominal discomfort and vomiting without significant tenderness on exam, question kidney stone.  Less likely appendicitis.  CT scan obtained.  CT scan is largely unremarkable.  However, patient could have recently passed a kidney stone without evidence on CT.  Recommend hydration and ibuprofen as needed for pain.  After history, exam, and medical workup I feel the patient has been appropriately medically screened and is safe for discharge home. Pertinent diagnoses were discussed with the patient. Patient was given return precautions.  Final Clinical Impression(s) / ED Diagnoses Final diagnoses:  Gross hematuria    Rx / DC Orders ED Discharge Orders     None        Zona Pedro, Mayer Masker, MD 06/16/21 [phone]

## 2022-01-10 ENCOUNTER — Other Ambulatory Visit: Payer: Self-pay

## 2022-01-10 ENCOUNTER — Encounter (HOSPITAL_COMMUNITY): Payer: Self-pay

## 2022-01-10 ENCOUNTER — Emergency Department (HOSPITAL_COMMUNITY): Payer: Self-pay

## 2022-01-10 ENCOUNTER — Emergency Department (HOSPITAL_COMMUNITY)
Admission: EM | Admit: 2022-01-10 | Discharge: 2022-01-11 | Disposition: A | Discharge status: Home / Self Care | Payer: Self-pay | Attending: Emergency Medicine | Admitting: Emergency Medicine

## 2022-01-10 DIAGNOSIS — K209 Esophagitis, unspecified without bleeding: Secondary | ICD-10-CM | POA: Insufficient documentation

## 2022-01-10 DIAGNOSIS — F17210 Nicotine dependence, cigarettes, uncomplicated: Secondary | ICD-10-CM | POA: Insufficient documentation

## 2022-01-10 DIAGNOSIS — R0602 Shortness of breath: Secondary | ICD-10-CM | POA: Insufficient documentation

## 2022-01-10 DIAGNOSIS — Z91040 Latex allergy status: Secondary | ICD-10-CM | POA: Insufficient documentation

## 2022-01-10 LAB — COMPREHENSIVE METABOLIC PANEL
ALT: 17 U/L (ref 0–44)
AST: 19 U/L (ref 15–41)
Albumin: 4.4 g/dL (ref 3.5–5.0)
Alkaline Phosphatase: 81 U/L (ref 38–126)
Anion gap: 9 (ref 5–15)
BUN: 18 mg/dL (ref 6–20)
CO2: 28 mmol/L (ref 22–32)
Calcium: 9.2 mg/dL (ref 8.9–10.3)
Chloride: 105 mmol/L (ref 98–111)
Creatinine, Ser: 0.87 mg/dL (ref 0.44–1.00)
GFR, Estimated: >60 mL/min (ref >60)
Glucose, Bld: 80 mg/dL (ref 70–99)
Potassium: 3.8 mmol/L (ref 3.5–5.1)
Sodium: 142 mmol/L (ref 135–145)
Total Bilirubin: 0.5 mg/dL (ref 0.3–1.2)
Total Protein: 7.5 g/dL (ref 6.5–8.1)

## 2022-01-10 LAB — CBC WITH DIFFERENTIAL/PLATELET
Abs Immature Granulocytes: 0.03 10*3/uL (ref 0.00–0.07)
Basophils Absolute: 0.1 10*3/uL (ref 0.0–0.1)
Basophils Relative: 1 %
Eosinophils Absolute: 0.3 10*3/uL (ref 0.0–0.5)
Eosinophils Relative: 3 %
HCT: 40.9 % (ref 36.0–46.0)
Hemoglobin: 13.8 g/dL (ref 12.0–15.0)
Immature Granulocytes: 0 %
Lymphocytes Relative: 33 %
Lymphs Abs: 3.6 10*3/uL (ref 0.7–4.0)
MCH: 29.5 pg (ref 26.0–34.0)
MCHC: 33.7 g/dL (ref 30.0–36.0)
MCV: 87.4 fL (ref 80.0–100.0)
Monocytes Absolute: 0.7 10*3/uL (ref 0.1–1.0)
Monocytes Relative: 6 %
Neutro Abs: 6 10*3/uL (ref 1.7–7.7)
Neutrophils Relative %: 57 %
Platelets: 279 10*3/uL (ref 150–400)
RBC: 4.68 MIL/uL (ref 3.87–5.11)
RDW: 13.5 % (ref 11.5–15.5)
WBC: 10.7 10*3/uL — ABNORMAL HIGH (ref 4.0–10.5)
nRBC: 0 % (ref 0.0–0.2)

## 2022-01-10 LAB — URINALYSIS, ROUTINE W REFLEX MICROSCOPIC
Bilirubin Urine: NEGATIVE
Glucose, UA: NEGATIVE mg/dL
Hgb urine dipstick: NEGATIVE
Ketones, ur: NEGATIVE mg/dL
Leukocytes,Ua: NEGATIVE
Nitrite: NEGATIVE
Protein, ur: NEGATIVE mg/dL
Specific Gravity, Urine: 1.017 (ref 1.005–1.030)
pH: 5 (ref 5.0–8.0)

## 2022-01-10 LAB — LIPASE, BLOOD: Lipase: 33 U/L (ref 11–51)

## 2022-01-10 NOTE — ED Triage Notes (Signed)
Patient has had centralized chest pain in between the breast bone since Tuesday. Has not had appetite since then. The pain is pulsating pressure, no radiation. Has vomited 4x since Tuesday.  ?

## 2022-01-10 NOTE — ED Provider Triage Note (Signed)
Emergency Medicine Provider Triage Evaluation Note ? ?Jordan Ibarra , a 41 y.o. female  was evaluated in triage.  Pt complains of chest pain. Onset 3 days ago. Worse with eating. Better with soft foods. + vomting and loss of appetite. No hx of same ? ?Review of Systems  ?Positive: cp ?Negative: fever ? ?Physical Exam  ?BP 134/84 (BP Location: Left Arm)   Pulse 74   Temp 98.1 ?F (36.7 ?C) (Oral)   Resp 17   Ht 5\' 5"  (1.651 m)   Wt 88.9 kg   SpO2 98%   BMI 32.62 kg/m?  ?Gen:   Awake, no distress   ?Resp:  Normal effort  ?MSK:   Moves extremities without difficulty  ?Other:  No abd tenderness ? ?Medical Decision Making  ?Medically screening exam initiated at 8:08 PM.  Appropriate orders placed.  Jordan Ibarra was informed that the remainder of the evaluation will be completed by another provider, this initial triage assessment does not replace that evaluation, and the importance of remaining in the ED until their evaluation is complete. ? ? ?  ?Jordan Dom, PA-C ?01/10/22 2011 ? ?

## 2022-01-11 LAB — TROPONIN I (HIGH SENSITIVITY): Troponin I (High Sensitivity): 3 ng/L (ref <18)

## 2022-01-11 MED ORDER — SUCRALFATE 1 G PO TABS: 1.0000 g | ORAL_TABLET | Freq: Three times a day (TID) | ORAL | 1 refills | Status: AC

## 2022-01-11 MED ORDER — SUCRALFATE 1 GM/10ML PO SUSP
1.0000 g | Freq: Once | ORAL | Status: AC
  Administered 2022-01-11: 1 g via ORAL
  Filled 2022-01-11: qty 10

## 2022-01-11 MED ORDER — PANTOPRAZOLE SODIUM 40 MG PO TBEC: DELAYED_RELEASE_TABLET | ORAL | 0 refills | Status: AC

## 2022-01-11 MED ORDER — PANTOPRAZOLE SODIUM 40 MG IV SOLR
40.0000 mg | Freq: Once | INTRAVENOUS | Status: AC
  Administered 2022-01-11: 40 mg via INTRAVENOUS
  Filled 2022-01-11: qty 10

## 2022-01-11 NOTE — ED Provider Notes (Signed)
? ?[facility]-EMERGENCY DEPT ?Provider Note: Lowella DellJ. Lane Donatella Walski, MD, FACEP ? ?CSN: [id number] ?MRN: [id number] ?ARRIVAL: 01/10/22 at 1949 ?ROOM: [facility] ? ? ?CHIEF COMPLAINT  ?Chest Pain ? ? ?HISTORY OF PRESENT ILLNESS  ?01/11/22 12:25 AM ?Jordan Ibarra is a 41 y.o. female who has had mid substernal chest discomfort (tightness) since waking up 4 mornings ago.  She has had no associated shortness of breath.  She has had 4 episodes of vomiting with this.  She describes the emesis is bilious.  The symptoms are worse when eating solid food and less severe when eating soft or liquid food.  She has a remote history of GERD but has not taken anything for this in years.  ? ? ?Past Medical History:  ?Diagnosis Date  ? GERD (gastroesophageal reflux disease)   ? otc  Tums  ? ? ?Past Surgical History:  ?Procedure Laterality Date  ? ABDOMINAL HYSTERECTOMY    ? ANTERIOR CERVICAL DECOMP/DISCECTOMY FUSION N/A 01/22/2016  ? Procedure: Cervical six - seven Anterior cervical discectomy with fusion and plate fixation;  Surgeon: Loura HaltBenjamin Jared Ditty, MD;  Location: [facility];  Service: Neurosurgery;  Laterality: N/A;  C6-7 Anterior cervical discectomy with fusion and plate fixation  ? APPENDECTOMY    ? bladder prolapse surgery    ? TONSILLECTOMY    ? ? ?Family History  ?Problem Relation Age of Onset  ? Kidney failure Mother   ? Scoliosis Mother   ? Ovarian cancer Sister   ? Lung cancer Maternal Grandmother   ? ? ?Social History  ? ?Tobacco Use  ? Smoking status: Every Day  ?  Packs/day: 0.15  ?  Years: 22.00  ?  Pack years: 3.30  ?  Types: Cigarettes  ? Smokeless tobacco: Never  ?Vaping Use  ? Vaping Use: Never used  ?Substance Use Topics  ? Alcohol use: Yes  ?  Comment: "Not that often"  ? Drug use: No  ?  Comment: none in 10 years  ? ? ?Prior to Admission medications   ?Medication Sig Start Date End Date Taking? Authorizing Provider  ?pantoprazole (PROTONIX) 40 MG tablet Take 1 tablet daily at least 30 minutes before first dose of Carafate.  01/11/22  Yes Viridiana Spaid, MD  ?sucralfate (CARAFATE) 1 g tablet Take 1 tablet (1 g total) by mouth 4 (four) times daily -  with meals and at bedtime. 01/11/22  Yes Ramiz Turpin, Jonny RuizJohn, MD  ? ? ?Allergies ?Latex ? ? ?REVIEW OF SYSTEMS  ?Negative except as noted here or in the History of Present Illness. ? ? ?PHYSICAL EXAMINATION  ?Initial Vital Signs ?Blood pressure 137/84, pulse 77, temperature 98.4 ?F (36.9 ?C), temperature source Oral, resp. rate 16, height 5\' 5"  (1.651 m), weight 88.9 kg, SpO2 98 %. ? ?Examination ?General: Well-developed, well-nourished female in no acute distress; appearance consistent with age of record ?HENT: normocephalic; atraumatic ?Eyes: Normal appearance ?Neck: supple ?Heart: regular rate and rhythm ?Lungs: clear to auscultation bilaterally ?Abdomen: soft; nondistended; nontender; no masses or hepatosplenomegaly; bowel sounds present ?Extremities: No deformity; full range of motion; pulses normal ?Neurologic: Awake, alert and oriented; motor function intact in all extremities and symmetric; no facial droop ?Skin: Warm and dry ?Psychiatric: Normal mood and affect ? ? ?RESULTS  ?Summary of this visit's results, reviewed and interpreted by myself: ? ? EKG Interpretation ? ?Date/Time:  Friday Jan 10 2022 19:59:15 EDT ?Ventricular Rate:  75 ?PR Interval:  171 ?QRS Duration: 108 ?QT Interval:  426 ?QTC Calculation: 476 ?R Axis:  97 ?Text Interpretation: Sinus rhythm Left atrial enlargement Consider right ventricular hypertrophy Nonspecific T abnrm, anterolateral leads No significant change was found Confirmed by Giavonni Fonder, Jonny Ruiz [phone]) on 01/11/2022 12:26:42 AM ?  ? ?  ? ?Laboratory Studies: ?Results for orders placed or performed during the hospital encounter of 01/10/22 (from the past 24 hour(s))  ?Urinalysis, Routine w reflex microscopic Urine, Clean Catch     Status: None  ? Collection Time: 01/10/22  8:35 PM  ?Result Value Ref Range  ? Color, Urine YELLOW YELLOW  ? APPearance CLEAR CLEAR  ? Specific  Gravity, Urine 1.017 1.005 - 1.030  ? pH 5.0 5.0 - 8.0  ? Glucose, UA NEGATIVE NEGATIVE mg/dL  ? Hgb urine dipstick NEGATIVE NEGATIVE  ? Bilirubin Urine NEGATIVE NEGATIVE  ? Ketones, ur NEGATIVE NEGATIVE mg/dL  ? Protein, ur NEGATIVE NEGATIVE mg/dL  ? Nitrite NEGATIVE NEGATIVE  ? Leukocytes,Ua NEGATIVE NEGATIVE  ?CBC with Differential     Status: Abnormal  ? Collection Time: 01/10/22  8:43 PM  ?Result Value Ref Range  ? WBC 10.7 (H) 4.0 - 10.5 K/uL  ? RBC 4.68 3.87 - 5.11 MIL/uL  ? Hemoglobin 13.8 12.0 - 15.0 g/dL  ? HCT 40.9 36.0 - 46.0 %  ? MCV 87.4 80.0 - 100.0 fL  ? MCH 29.5 26.0 - 34.0 pg  ? MCHC 33.7 30.0 - 36.0 g/dL  ? RDW 13.5 11.5 - 15.5 %  ? Platelets 279 150 - 400 K/uL  ? nRBC 0.0 0.0 - 0.2 %  ? Neutrophils Relative % 57 %  ? Neutro Abs 6.0 1.7 - 7.7 K/uL  ? Lymphocytes Relative 33 %  ? Lymphs Abs 3.6 0.7 - 4.0 K/uL  ? Monocytes Relative 6 %  ? Monocytes Absolute 0.7 0.1 - 1.0 K/uL  ? Eosinophils Relative 3 %  ? Eosinophils Absolute 0.3 0.0 - 0.5 K/uL  ? Basophils Relative 1 %  ? Basophils Absolute 0.1 0.0 - 0.1 K/uL  ? Immature Granulocytes 0 %  ? Abs Immature Granulocytes 0.03 0.00 - 0.07 K/uL  ?Comprehensive metabolic panel     Status: None  ? Collection Time: 01/10/22  8:43 PM  ?Result Value Ref Range  ? Sodium 142 135 - 145 mmol/L  ? Potassium 3.8 3.5 - 5.1 mmol/L  ? Chloride 105 98 - 111 mmol/L  ? CO2 28 22 - 32 mmol/L  ? Glucose, Bld 80 70 - 99 mg/dL  ? BUN 18 6 - 20 mg/dL  ? Creatinine, Ser 0.87 0.44 - 1.00 mg/dL  ? Calcium 9.2 8.9 - 10.3 mg/dL  ? Total Protein 7.5 6.5 - 8.1 g/dL  ? Albumin 4.4 3.5 - 5.0 g/dL  ? AST 19 15 - 41 U/L  ? ALT 17 0 - 44 U/L  ? Alkaline Phosphatase 81 38 - 126 U/L  ? Total Bilirubin 0.5 0.3 - 1.2 mg/dL  ? GFR, Estimated >60 >60 mL/min  ? Anion gap 9 5 - 15  ?Lipase, blood     Status: None  ? Collection Time: 01/10/22  8:43 PM  ?Result Value Ref Range  ? Lipase 33 11 - 51 U/L  ?Troponin I (High Sensitivity)     Status: None  ? Collection Time: 01/11/22  1:08 AM  ?Result  Value Ref Range  ? Troponin I (High Sensitivity) 3 <18 ng/L  ? ?Imaging Studies: ?DG Chest 2 View ? ?Result Date: 01/10/2022 ?CLINICAL DATA:  Centralized chest pain for 3 days. EXAM: CHEST - 2 VIEW COMPARISON:  AP  chest 08/23/2020 FINDINGS: Cardiac silhouette and mediastinal contours within normal limits. Mildly decreased lung volumes. Mild-to-moderate right hemidiaphragm elevation. Mild bibasilar bronchovascular crowding. No focal airspace opacity to indicate pneumonia. No pleural effusion or pneumothorax. No acute skeletal abnormality. IMPRESSION: Chronic elevation of the right hemidiaphragm with basilar subsegmental atelectasis. Electronically Signed   By: Neita Garnet M.D.   On: 01/10/2022 20:27   ? ?ED COURSE and MDM  ?Nursing notes, initial and subsequent vitals signs, including pulse oximetry, reviewed and interpreted by myself. ? ?Vitals:  ? 01/11/22 0030 01/11/22 0045 01/11/22 0100 01/11/22 0202  ?BP: 130/82 137/77 134/74 (!) 146/86  ?Pulse: 71 72 80 80  ?Resp: 17 17 16 16   ?Temp:      ?TempSrc:      ?SpO2: 94% 94% 93% 90%  ?Weight:      ?Height:      ? ?Medications  ?pantoprazole (PROTONIX) injection 40 mg (40 mg Intravenous Given 01/11/22 0108)  ?sucralfate (CARAFATE) 1 GM/10ML suspension 1 g (1 g Oral Given 01/11/22 0108)  ? ?2:07 AM ?Patient's chest pain improved after Carafate orally and Protonix IV.  I suspect the patient's symptoms are esophageal due to acid reflux.  We will start her on a PPI and Carafate.  Her presentation is not consistent with cardiac etiology.  It is not exertional and is related to eating and swallowing. ? ? ?PROCEDURES  ?Procedures ? ? ?ED DIAGNOSES  ? ?  ICD-10-CM   ?1. Esophagitis, acute  K20.90   ?  ? ? ? ?  ?03/13/22, MD ?01/11/22 0211 ? ?

## 2022-05-20 ENCOUNTER — Emergency Department (HOSPITAL_COMMUNITY)
Admission: EM | Admit: 2022-05-20 | Discharge: 2022-05-20 | Disposition: A | Discharge status: Home / Self Care | Payer: Self-pay | Attending: Emergency Medicine | Admitting: Emergency Medicine

## 2022-05-20 ENCOUNTER — Emergency Department (HOSPITAL_COMMUNITY): Payer: Self-pay

## 2022-05-20 ENCOUNTER — Encounter (HOSPITAL_COMMUNITY): Payer: Self-pay

## 2022-05-20 DIAGNOSIS — R079 Chest pain, unspecified: Secondary | ICD-10-CM | POA: Insufficient documentation

## 2022-05-20 DIAGNOSIS — R0602 Shortness of breath: Secondary | ICD-10-CM | POA: Insufficient documentation

## 2022-05-20 DIAGNOSIS — R110 Nausea: Secondary | ICD-10-CM | POA: Insufficient documentation

## 2022-05-20 LAB — I-STAT BETA HCG BLOOD, ED (MC, WL, AP ONLY): I-stat hCG, quantitative: <5 m[IU]/mL (ref <5)

## 2022-05-20 LAB — CBC
HCT: 41.7 % (ref 36.0–46.0)
Hemoglobin: 13.7 g/dL (ref 12.0–15.0)
MCH: 28.8 pg (ref 26.0–34.0)
MCHC: 32.9 g/dL (ref 30.0–36.0)
MCV: 87.6 fL (ref 80.0–100.0)
Platelets: 266 10*3/uL (ref 150–400)
RBC: 4.76 MIL/uL (ref 3.87–5.11)
RDW: 13 % (ref 11.5–15.5)
WBC: 9.3 10*3/uL (ref 4.0–10.5)
nRBC: 0 % (ref 0.0–0.2)

## 2022-05-20 LAB — BASIC METABOLIC PANEL
Anion gap: 7 (ref 5–15)
BUN: 16 mg/dL (ref 6–20)
CO2: 23 mmol/L (ref 22–32)
Calcium: 9.2 mg/dL (ref 8.9–10.3)
Chloride: 109 mmol/L (ref 98–111)
Creatinine, Ser: 0.96 mg/dL (ref 0.44–1.00)
GFR, Estimated: >60 mL/min (ref >60)
Glucose, Bld: 112 mg/dL — ABNORMAL HIGH (ref 70–99)
Potassium: 3.8 mmol/L (ref 3.5–5.1)
Sodium: 139 mmol/L (ref 135–145)

## 2022-05-20 LAB — TROPONIN I (HIGH SENSITIVITY)
Troponin I (High Sensitivity): <2 ng/L (ref <18)
Troponin I (High Sensitivity): <2 ng/L (ref <18)

## 2022-05-20 MED ORDER — ASPIRIN 81 MG PO CHEW
324.0000 mg | CHEWABLE_TABLET | Freq: Once | ORAL | Status: AC
  Administered 2022-05-20: 324 mg via ORAL
  Filled 2022-05-20: qty 4

## 2022-05-20 MED ORDER — SODIUM CHLORIDE (PF) 0.9 % IJ SOLN
INTRAMUSCULAR | Status: AC
  Filled 2022-05-20: qty 50

## 2022-05-20 MED ORDER — OXYCODONE-ACETAMINOPHEN 5-325 MG PO TABS
1.0000 | ORAL_TABLET | Freq: Once | ORAL | Status: AC
  Administered 2022-05-20: 1 via ORAL
  Filled 2022-05-20: qty 1

## 2022-05-20 MED ORDER — IOHEXOL 350 MG/ML SOLN
75.0000 mL | Freq: Once | INTRAVENOUS | Status: AC | PRN
  Administered 2022-05-20: 75 mL via INTRAVENOUS

## 2022-05-20 NOTE — ED Provider Notes (Signed)
[facility]-EMERGENCY DEPT Provider Note   CSN: [id number] Arrival date & time: 05/20/22  1528     History  Chief Complaint  Patient presents with   Chest Pain    Jordan Ibarra is a 41 y.o. female.   Chest Pain Associated symptoms: nausea and shortness of breath   Patient presents for chest pain.  Medical history includes GERD.  Patient reports that she was in her normal state of health earlier today.  At around 3 PM, she was driving.  She experienced a sudden onset sharp pain in the left anterior chest area.  Pain did not radiate.  She had associated shortness of breath and felt mildly nauseous.  Symptoms have subsided but she continues to have left-sided chest discomfort.  Pain worsened with coughing, deep inspiration, and firm palpation.     Home Medications Prior to Admission medications   Medication Sig Start Date End Date Taking? Authorizing Provider  acetaminophen (TYLENOL) 500 MG tablet Take 500-1,000 mg by mouth daily as needed for mild pain or headache.   Yes [provider]  albuterol (VENTOLIN HFA) 108 (90 Base) MCG/ACT inhaler Inhale 1-2 puffs into the lungs every 6 (six) hours as needed for wheezing or shortness of breath.   Yes [provider]  atorvastatin (LIPITOR) 10 MG tablet Take 10 mg by mouth daily. 04/30/22  Yes [provider]  pantoprazole (PROTONIX) 40 MG tablet Take 1 tablet daily at least 30 minutes before first dose of Carafate. Patient taking differently: Take 40 mg by mouth See admin instructions. Take 40 mg by mouth once a day at least 30 minutes before first dose of Carafate 01/11/22  Yes Molpus, John, MD  sucralfate (CARAFATE) 1 g tablet Take 1 tablet (1 g total) by mouth 4 (four) times daily -  with meals and at bedtime. 01/11/22  Yes Molpus, John, MD      Allergies    Latex    Review of Systems   Review of Systems  Respiratory:  Positive for shortness of breath.   Cardiovascular:  Positive for  chest pain.  Gastrointestinal:  Positive for nausea.  All other systems reviewed and are negative.   Physical Exam Updated Vital Signs BP 126/84   Pulse 65   Temp 98.3 F (36.8 C) (Oral)   Resp 16   Ht 5\' 5"  (1.651 m)   Wt 93 kg   SpO2 94%   BMI 34.11 kg/m  Physical Exam Vitals and nursing note reviewed.  Constitutional:      General: She is not in acute distress.    Appearance: She is well-developed. She is not ill-appearing, toxic-appearing or diaphoretic.  HENT:     Head: Normocephalic and atraumatic.  Eyes:     Conjunctiva/sclera: Conjunctivae normal.  Neck:     Vascular: No JVD.  Cardiovascular:     Rate and Rhythm: Normal rate and regular rhythm.     Heart sounds: No murmur heard. Pulmonary:     Effort: Pulmonary effort is normal. No tachypnea or respiratory distress.     Breath sounds: Normal breath sounds. No decreased breath sounds, wheezing, rhonchi or rales.  Chest:     Chest wall: Tenderness present.  Abdominal:     Palpations: Abdomen is soft.     Tenderness: There is no abdominal tenderness.  Musculoskeletal:        General: No swelling.     Cervical back: Normal range of motion and neck supple.     Right  lower leg: No edema.     Left lower leg: No edema.  Skin:    General: Skin is warm and dry.     Capillary Refill: Capillary refill takes less than 2 seconds.  Neurological:     General: No focal deficit present.     Mental Status: She is alert and oriented to person, place, and time.  Psychiatric:        Mood and Affect: Mood normal.        Behavior: Behavior normal.     ED Results / Procedures / Treatments   Labs (all labs ordered are listed, but only abnormal results are displayed) Labs Reviewed  BASIC METABOLIC PANEL - Abnormal; Notable for the following components:      Result Value   Glucose, Bld 112 (*)    All other components within normal limits  CBC  I-STAT BETA HCG BLOOD, ED ([facility], [facility], [facility] ONLY)  TROPONIN I (HIGH SENSITIVITY)   TROPONIN I (HIGH SENSITIVITY)    EKG EKG Interpretation  Date/Time:  Tuesday May 20 2022 15:46:43 EDT Ventricular Rate:  81 PR Interval:  170 QRS Duration: 105 QT Interval:  398 QTC Calculation: 462 R Axis:   95 Text Interpretation: Sinus rhythm Consider RVH w/ secondary repol abnormality Nonspecific T abnormalities, lateral leads No significant change since last tracing Confirmed by Gloris Manchester [phone]) on 05/20/2022 10:17:43 PM  Radiology CT Angio Chest PE W and/or Wo Contrast  Result Date: 05/20/2022 CLINICAL DATA:  Shortness of breath. Left-sided chest pain that started approximately 1.5 prior to arrival. Chest tightness. Pain 10/10. EXAM: CT ANGIOGRAPHY CHEST WITH CONTRAST TECHNIQUE: Multidetector CT imaging of the chest was performed using the standard protocol during bolus administration of intravenous contrast. Multiplanar CT image reconstructions and MIPs were obtained to evaluate the vascular anatomy. RADIATION DOSE REDUCTION: This exam was performed according to the departmental dose-optimization program which includes automated exposure control, adjustment of the mA and/or kV according to patient size and/or use of iterative reconstruction technique. CONTRAST:  77mL OMNIPAQUE IOHEXOL 350 MG/ML SOLN COMPARISON:  CT examination dated September 07, 2015. FINDINGS: Cardiovascular: Satisfactory opacification of the pulmonary arteries to the segmental level. No evidence of pulmonary embolism. Normal heart size. No pericardial effusion. Mediastinum/Nodes: No enlarged mediastinal, hilar, or axillary lymph nodes. Thyroid gland, trachea, and esophagus demonstrate no significant findings. Lungs/Pleura: Lungs are clear. No pleural effusion or pneumothorax. Subsegmental linear atelectasis in the left lower lobe. Small right lower lobe pneumatocele. Upper Abdomen: No acute abnormality. Musculoskeletal: No chest wall abnormality. No acute or significant osseous findings. Review of the MIP images  confirms the above findings. IMPRESSION: 1. No evidence of pulmonary embolism. 2. Lungs are clear without evidence of acute pulmonary process. Electronically Signed   By: Larose Hires D.O.   On: 05/20/2022 22:12   DG Chest 2 View  Result Date: 05/20/2022 CLINICAL DATA:  Chest pain of acute onset. EXAM: CHEST - 2 VIEW COMPARISON:  01/10/2022 FINDINGS: Heart size is normal. Mediastinal shadows are normal. There is chronic elevation of the right hemidiaphragm compared to the left. Mild chronic volume loss at the right lung base subsequent to that. IMPRESSION: No active cardiopulmonary disease. Chronic elevation of the right hemidiaphragm with mild chronic volume loss at the right lung base subsequent to that. Electronically Signed   By: Paulina Fusi M.D.   On: 05/20/2022 16:19    Procedures Procedures    Medications Ordered in ED Medications  sodium chloride (PF) 0.9 % injection (has no administration  in time range)  aspirin chewable tablet 324 mg (324 mg Oral Given 05/20/22 1555)  oxyCODONE-acetaminophen (PERCOCET/ROXICET) 5-325 MG per tablet 1 tablet (1 tablet Oral Given 05/20/22 2135)  iohexol (OMNIPAQUE) 350 MG/ML injection 75 mL (75 mLs Intravenous Contrast Given 05/20/22 2158)    ED Course/ Medical Decision Making/ A&P                           Medical Decision Making Amount and/or Complexity of Data Reviewed Labs: ordered. Radiology: ordered.  Risk Prescription drug management.   This patient presents to the ED for concern of chest pain, this involves an extensive number of treatment options, and is a complaint that carries with it a high risk of complications and morbidity.  The differential diagnosis includes ACS, PE, pericarditis, myocarditis, costochondritis, GERD, anxiety   Co morbidities that complicate the patient evaluation  GERD   Additional history obtained:  Additional history obtained from N/A External records from outside source obtained and reviewed including  EMR   Lab Tests:  I Ordered, and personally interpreted labs.  The pertinent results include: Normal hemoglobin, no leukocytosis, normal electrolytes, normal kidney function, normal troponins x2   Imaging Studies ordered:  I ordered imaging studies including chest x-ray, CTA chest I independently visualized and interpreted imaging which showed no acute findings I agree with the radiologist interpretation   Cardiac Monitoring: / EKG:  The patient was maintained on a cardiac monitor.  I personally viewed and interpreted the cardiac monitored which showed an underlying rhythm of: Sinus rhythm   Problem List / ED Course / Critical interventions / Medication management  Patient presents for acute onset of left-sided chest pain starting today at 3 PM.  Pain was sharp in nature.  She had associated shortness of breath and nausea.  She endorsed pleurisy and tenderness.  Since onset, nausea has resolved.  Pain has subsided but she continues to endorse a mild left-sided chest pain that is worse with coughing and deep inspiration.  Prior to being bedded in the ED, diagnostic work-up was initiated.  EKG shows no significant changes from prior EKGs.  She was given ASA in ED triage.  Lab work shows normal hemoglobin, no leukocytosis, and normal initial troponin.  On assessment, patient is well-appearing.  Breathing is unlabored.  Vital signs are normal.  She does have some mild chest tenderness on the left side.  Lungs are clear to auscultation.  Given the pleuritic nature of her pain and ongoing symptoms, patient to undergo CTA chest.  Percocet was given for analgesia.  CTA chest showed no acute findings.  Patient had improved symptoms while in the ED.  Given her reassuring work-up, patient is stable for discharge at this time.  She declined referral to cardiology.  She will follow-up with her primary care doctor and obtain referrals through her as needed.  She was discharged in good condition. I ordered  medication including Percocet for analgesia Reevaluation of the patient after these medicines showed that the patient improved I have reviewed the patients home medicines and have made adjustments as needed   Social Determinants of Health:  Has PCP         Final Clinical Impression(s) / ED Diagnoses Final diagnoses:  Chest pain, unspecified type    Rx / DC Orders ED Discharge Orders     None         Godfrey Pick, MD 05/20/22 2336

## 2022-05-20 NOTE — Discharge Instructions (Signed)
The testing done in the emergency department is reassuring.  Please follow-up with your primary care doctor when possible and return to the emergency department for any new or worsening symptoms of concern.

## 2022-05-20 NOTE — ED Notes (Addendum)
Pt needs IV or IV charted for CT please

## 2022-05-20 NOTE — ED Triage Notes (Signed)
Pt c/o L sided CP that began approximately 1.5 hours PTA.

## 2022-05-20 NOTE — ED Provider Triage Note (Signed)
Emergency Medicine Provider Triage Evaluation Note  ZEYNA MKRTCHYAN , a 41 y.o. female  was evaluated in triage.  Pt complains of left-sided chest pain which started approximately 1.5 hours prior to arrival.  Patient was wearing earrings when the pain started.  Reports as a tightness in his chest without radiation, rates pain 10 out of 10.  Also reports shortness of breath at rest since chest pain started, shortness of breath is increased with activity.  Patient has known history of heart murmur, otherwise no known cardiac history.  Review of Systems  Positive: Chest pain, shortness of breath, nausea Negative: Diarrhea, diaphoresis,, vomiting, syncope  Physical Exam  BP (!) 123/95 (BP Location: Right Arm)   Pulse 80   Temp 98 F (36.7 C) (Oral)   Resp (!) 22   Ht 5\' 5"  (1.651 m)   Wt 93 kg   SpO2 95%   BMI 34.11 kg/m  Gen:   Awake, moderate distress Resp:  Shallow breathing with tachypnea MSK:   Moves extremities without difficulty, no peripheral edema Other:  Lungs CTA B  Medical Decision Making  Medically screening exam initiated at 3:52 PM.  Appropriate orders placed.  LASHANNON BRESNAN was informed that the remainder of the evaluation will be completed by another provider, this initial triage assessment does not replace that evaluation, and the importance of remaining in the ED until their evaluation is complete.  We will begin chest pain work-up.  We will also give 324 chewable aspirin    Nikki Dom, Michelle Piper 05/20/22 1554

## 2022-08-16 ENCOUNTER — Emergency Department (HOSPITAL_COMMUNITY)
Admission: EM | Admit: 2022-08-16 | Discharge: 2022-08-17 | Disposition: A | Discharge status: Home / Self Care | Payer: Self-pay | Attending: Emergency Medicine | Admitting: Emergency Medicine

## 2022-08-16 ENCOUNTER — Other Ambulatory Visit: Payer: Self-pay

## 2022-08-16 ENCOUNTER — Emergency Department (HOSPITAL_COMMUNITY): Payer: Self-pay

## 2022-08-16 DIAGNOSIS — Z8541 Personal history of malignant neoplasm of cervix uteri: Secondary | ICD-10-CM | POA: Insufficient documentation

## 2022-08-16 DIAGNOSIS — Z1152 Encounter for screening for COVID-19: Secondary | ICD-10-CM | POA: Insufficient documentation

## 2022-08-16 DIAGNOSIS — R519 Headache, unspecified: Secondary | ICD-10-CM | POA: Insufficient documentation

## 2022-08-16 DIAGNOSIS — R112 Nausea with vomiting, unspecified: Secondary | ICD-10-CM | POA: Insufficient documentation

## 2022-08-16 DIAGNOSIS — Z91040 Latex allergy status: Secondary | ICD-10-CM | POA: Insufficient documentation

## 2022-08-16 LAB — RESP PANEL BY RT-PCR (RSV, FLU A&B, COVID)  RVPGX2
Influenza A by PCR: NEGATIVE
Influenza B by PCR: NEGATIVE
Resp Syncytial Virus by PCR: NEGATIVE
SARS Coronavirus 2 by RT PCR: NEGATIVE

## 2022-08-16 NOTE — ED Triage Notes (Signed)
Patient coming to ED for evaluation of sudden onset headache x 2 days.  Has had nausea.  No reports of fevers.

## 2022-08-16 NOTE — ED Provider Triage Note (Signed)
Emergency Medicine Provider Triage Evaluation Note  Jordan Ibarra , a 41 y.o. female  was evaluated in triage.  Pt complains of sharp, sudden onset headache felt in the center of her head and the top with associated nausea.  Patient states she had a sudden onset of the headache 2 days ago which has waxed and waned in intensity since that time.  She states that this time it is the most severe pain is ever had.  She also endorses nausea with some vomiting since that.  She does states she was able to drink a bottle of water prior to coming to the emergency department and was able to keep that down.  The patient does endorse some chills that began today.  Denies history of migraines.  Denies fevers, abdominal pain, chest pain, shortness of breath, cough  Review of Systems  Positive: As above Negative: As above  Physical Exam  BP 122/87 (BP Location: Left Arm)   Pulse 70   Temp 98.2 F (36.8 C) (Oral)   Resp 16   Ht 5\' 5"  (1.651 m)   Wt 83.9 kg   SpO2 94%   BMI 30.79 kg/m  Gen:   Awake, no distress, patient avoiding light due to photosensitivity Resp:  Normal effort  MSK:   Moves extremities without difficulty  Other:    Medical Decision Making  Medically screening exam initiated at 9:46 PM.  Appropriate orders placed.  CHRISTIAN TREADWAY was informed that the remainder of the evaluation will be completed by another provider, this initial triage assessment does not replace that evaluation, and the importance of remaining in the ED until their evaluation is complete.     Nikki Dom 08/16/22 2147

## 2022-08-17 MED ORDER — SODIUM CHLORIDE 0.9 % IV BOLUS
1000.0000 mL | Freq: Once | INTRAVENOUS | Status: AC
  Administered 2022-08-17: 1000 mL via INTRAVENOUS

## 2022-08-17 MED ORDER — KETOROLAC TROMETHAMINE 15 MG/ML IJ SOLN
15.0000 mg | Freq: Once | INTRAMUSCULAR | Status: AC
  Administered 2022-08-17: 15 mg via INTRAVENOUS
  Filled 2022-08-17: qty 1

## 2022-08-17 MED ORDER — PROCHLORPERAZINE EDISYLATE 10 MG/2ML IJ SOLN
10.0000 mg | Freq: Once | INTRAMUSCULAR | Status: AC
  Administered 2022-08-17: 10 mg via INTRAVENOUS
  Filled 2022-08-17: qty 2

## 2022-08-17 NOTE — ED Provider Notes (Signed)
[facility]-EMERGENCY DEPT Provider Note  CSN: [id number] Arrival date & time: 08/16/22 2129  Chief Complaint(s) Headache  HPI Jordan Ibarra is a 41 y.o. female with a past medical history listed below including cervical cancer status post total hysterectomy who presents to the emergency department for headache after an episode of nausea and nonbloody nonbilious emesis approximately 12 hours ago.  Headache was gradual onset but has worsened throughout several hours.  Attempted to take Tylenol with minimal relief.  Patient attempted sleeping and upon waking, she still had a headache.  This prompted her visit tonight.  She denies any known fevers or chills.  No coughing or congestion.  No abdominal pain.  No chest pain.  No focal deficits or visual disturbance.  No other physical complaints.   Headache   Past Medical History Past Medical History:  Diagnosis Date   GERD (gastroesophageal reflux disease)    otc  Tums   Patient Active Problem List   Diagnosis Date Noted   Infected cat bite of lower leg, right, initial encounter 02/28/2020   Cervical radiculopathy 01/22/2016   Bilateral hand pain 11/15/2015   CAP (community acquired pneumonia) 11/15/2015   Chest wall pain 04/17/2015   Home Medication(s) Prior to Admission medications   Medication Sig Start Date End Date Taking? Authorizing Provider  acetaminophen (TYLENOL) 500 MG tablet Take 500-1,000 mg by mouth daily as needed for mild pain or headache.   Yes [provider]  albuterol (VENTOLIN HFA) 108 (90 Base) MCG/ACT inhaler Inhale 1-2 puffs into the lungs every 6 (six) hours as needed for wheezing or shortness of breath.   Yes [provider]  atorvastatin (LIPITOR) 10 MG tablet Take 10 mg by mouth daily. 04/30/22  Yes [provider]  pantoprazole (PROTONIX) 40 MG tablet Take 1 tablet daily at least 30 minutes before first dose of Carafate. Patient taking differently: Take 40 mg  by mouth See admin instructions. Take 40 mg by mouth once a day at least 30 minutes before first dose of Carafate 01/11/22  Yes Molpus, John, MD  sucralfate (CARAFATE) 1 g tablet Take 1 tablet (1 g total) by mouth 4 (four) times daily -  with meals and at bedtime. 01/11/22  Yes Molpus, Jonny Ruiz, MD                                                                                                                                    Allergies Latex  Review of Systems Review of Systems  Neurological:  Positive for headaches.   As noted in HPI  Physical Exam Vital Signs  I have reviewed the triage vital signs BP (!) 148/98   Pulse 75   Temp 97.9 F (36.6 C) (Oral)   Resp 17   Ht 5\' 5"  (1.651 m)   Wt 83.9 kg   SpO2 95%   BMI 30.79 kg/m   Physical Exam Vitals reviewed.  Constitutional:      General: She is not in acute distress.    Appearance: She is well-developed. She is not diaphoretic.  HENT:     Head: Normocephalic and atraumatic.     Right Ear: External ear normal.     Left Ear: External ear normal.     Nose: Nose normal.  Eyes:     General: No scleral icterus.    Conjunctiva/sclera: Conjunctivae normal.  Neck:     Trachea: Phonation normal.  Cardiovascular:     Rate and Rhythm: Normal rate and regular rhythm.  Pulmonary:     Effort: Pulmonary effort is normal. No respiratory distress.     Breath sounds: No stridor.  Abdominal:     General: There is no distension.  Musculoskeletal:        General: Normal range of motion.     Cervical back: Normal range of motion.  Neurological:     Mental Status: She is alert and oriented to person, place, and time.     Cranial Nerves: Cranial nerves 2-12 are intact.     Sensory: Sensation is intact.     Motor: Motor function is intact.     Coordination: Coordination is intact.  Psychiatric:        Behavior: Behavior normal.     ED Results and Treatments Labs (all labs ordered are listed, but only abnormal results are  displayed) Labs Reviewed  RESP PANEL BY RT-PCR (RSV, FLU A&B, COVID)  [id number]                                                                                                                         EKG  EKG Interpretation  Date/Time:    Ventricular Rate:    PR Interval:    QRS Duration:   QT Interval:    QTC Calculation:   R Axis:     Text Interpretation:         Radiology CT Head Wo Contrast  Result Date: 08/16/2022 CLINICAL DATA:  Sudden onset headache for 2 days with nausea and vomiting. EXAM: CT HEAD WITHOUT CONTRAST TECHNIQUE: Contiguous axial images were obtained from the base of the skull through the vertex without intravenous contrast. RADIATION DOSE REDUCTION: This exam was performed according to the departmental dose-optimization program which includes automated exposure control, adjustment of the mA and/or kV according to patient size and/or use of iterative reconstruction technique. COMPARISON:  None Available. FINDINGS: Brain: No acute intracranial hemorrhage, midline shift or mass effect. No extra-axial fluid collection. Gray-white matter differentiation is within normal limits. No hydrocephalus. Vascular: No hyperdense vessel or unexpected calcification. Skull: Normal. Negative for fracture or focal lesion. Sinuses/Orbits: No acute finding. Other: None. IMPRESSION: No acute intracranial process. Electronically Signed   By: Brett Fairy M.D.   On: 08/16/2022 22:28    Medications Ordered in ED Medications  sodium chloride 0.9 % bolus 1,000 mL (1,000 mLs Intravenous New Bag/Given 08/17/22 0448)  prochlorperazine (COMPAZINE) injection 10 mg (10  mg Intravenous Given 08/17/22 0451)  ketorolac (TORADOL) 15 MG/ML injection 15 mg (15 mg Intravenous Given 08/17/22 0450)                                                                                                                                     Procedures Procedures  (including critical care time)  Medical Decision  Making / ED Course   Medical Decision Making Amount and/or Complexity of Data Reviewed Labs: ordered. Decision-making details documented in ED Course. Radiology: ordered and independent interpretation performed. Decision-making details documented in ED Course.  Risk Prescription drug management.    Severe headache.  Gradual onset.  In the setting of nausea and vomiting.  Will check for COVID/influenza.  Will also obtain a CT head given her history of prior cancer to rule out mass.  No fever or nuchal rigidity concerning for meningitis.  Doubt subarachnoid hemorrhage and ICH.  Patient will be provided with IV fluids, and headache cocktail.  COVID/influenza negative.  CT head negative for ICH or mass effect.  Pain improved after treatment.      Final Clinical Impression(s) / ED Diagnoses Final diagnoses:  Bad headache   The patient appears reasonably screened and/or stabilized for discharge and I doubt any other medical condition or other [facility] requiring further screening, evaluation, or treatment in the ED at this time. I have discussed the findings, Dx and Tx plan with the patient/family who expressed understanding and agree(s) with the plan. Discharge instructions discussed at length. The patient/family was given strict return precautions who verbalized understanding of the instructions. No further questions at time of discharge.  Disposition: Discharge  Condition: Good  ED Discharge Orders     None         Follow Up: Martinique, Julie M, NP [address] [place] [address] [phone]  Call  to schedule an appointment for close follow up           This chart was dictated using voice recognition software.  Despite best efforts to proofread,  errors can occur which can change the documentation meaning.    Fatima Blank, MD 08/17/22 [id number]

## 2023-02-27 ENCOUNTER — Emergency Department (HOSPITAL_COMMUNITY)
Admission: EM | Admit: 2023-02-27 | Discharge: 2023-02-27 | Disposition: A | Discharge status: Home / Self Care | Payer: Self-pay | Attending: Emergency Medicine | Admitting: Emergency Medicine

## 2023-02-27 DIAGNOSIS — L0291 Cutaneous abscess, unspecified: Secondary | ICD-10-CM | POA: Insufficient documentation

## 2023-02-27 DIAGNOSIS — Z91040 Latex allergy status: Secondary | ICD-10-CM | POA: Insufficient documentation

## 2023-02-27 DIAGNOSIS — L03818 Cellulitis of other sites: Secondary | ICD-10-CM | POA: Insufficient documentation

## 2023-02-27 MED ORDER — DOXYCYCLINE HYCLATE 100 MG PO CAPS: 100.0000 mg | ORAL_CAPSULE | Freq: Two times a day (BID) | ORAL | 0 refills | Status: AC

## 2023-02-27 NOTE — ED Provider Notes (Signed)
[place] EMERGENCY DEPARTMENT AT [facility] Provider Note   CSN: [id number] Arrival date & time: 02/27/23  2304     History  Chief Complaint  Patient presents with   Abscess    Jordan Ibarra is a 42 y.o. female who presents this evening with concern for a red bump on her bellybutton.  She states she was feeling her bellybutton and noticed the bump this evening.  There is no associated pain, fever, chills, discharge from the bump.  She states she cleans her bellybutton with Q-tips every day.  She also states she was bitten by a tick earlier this week with a obvious mark on her stomach.  She did see the tick and physically removed it.   Abscess      Home Medications Prior to Admission medications   Medication Sig Start Date End Date Taking? Authorizing Provider  doxycycline (VIBRAMYCIN) 100 MG capsule Take 1 capsule (100 mg total) by mouth 2 (two) times daily for 7 days. 02/27/23 03/06/23 Yes Arabella Merles, PA-C  acetaminophen (TYLENOL) 500 MG tablet Take 500-1,000 mg by mouth daily as needed for mild pain or headache.    [provider]  albuterol (VENTOLIN HFA) 108 (90 Base) MCG/ACT inhaler Inhale 1-2 puffs into the lungs every 6 (six) hours as needed for wheezing or shortness of breath.    [provider]  atorvastatin (LIPITOR) 10 MG tablet Take 10 mg by mouth daily. 04/30/22   [provider]  pantoprazole (PROTONIX) 40 MG tablet Take 1 tablet daily at least 30 minutes before first dose of Carafate. Patient taking differently: Take 40 mg by mouth See admin instructions. Take 40 mg by mouth once a day at least 30 minutes before first dose of Carafate 01/11/22   Molpus, John, MD  sucralfate (CARAFATE) 1 g tablet Take 1 tablet (1 g total) by mouth 4 (four) times daily -  with meals and at bedtime. 01/11/22   Molpus, John, MD      Allergies    Latex    Review of Systems   Review of Systems  Skin:        Bump    Physical  Exam Updated Vital Signs BP 127/77 (BP Location: Right Arm)   Pulse 90   Temp 97.9 F (36.6 C) (Oral)   Resp 16   Ht 5\' 5"  (1.651 m)   Wt 90.7 kg   SpO2 95%   BMI 33.27 kg/m  Physical Exam Vitals and nursing note reviewed.  Constitutional:      General: She is not in acute distress.    Appearance: Normal appearance.  HENT:     Head: Atraumatic.  Pulmonary:     Effort: Pulmonary effort is normal.  Musculoskeletal:     Cervical back: Normal range of motion.  Skin:    Findings: No rash.     Comments: Small half centimeter red bump in her umbilicus No surrounding erythema  No drainage from bump  On the right side of her abdomen, reportedly from tick.  No rashes  Neurological:     General: No focal deficit present.     Mental Status: She is alert.  Psychiatric:        Mood and Affect: Mood normal.        Behavior: Behavior normal.     ED Results / Procedures / Treatments   Labs (all labs ordered are listed, but only abnormal results are displayed) Labs Reviewed - No data to display  EKG None  Radiology No results found.  Procedures Procedures    Medications Ordered in ED Medications - No data to display  ED Course/ Medical Decision Making/ A&P                             Medical Decision Making  42 y.o. female presents to the ED for concern of bump on her bellybutton  Differential diagnosis includes but is not limited to folliculitis, cellulitis, trauma from q-tip usage, tick bite  ED Course:  Patient with small erythematous bump in her umbilicus concern for folliculitis versus trauma from Q-tip use, some erythema, possibly early infection.  She also has history of a tick bite this week.  No fever, chills.  Bump is not painful.  No other rashes.     Impression: Erythematous bump on umbilicus, suspect folliculitis versus trauma from Q-tip Recent tick bite  Disposition:  The patient was discharged home with instructions to take a 7-day course of  doxycycline.  Will treat her folliculitis/early cellulitis with doxycycline to also cover for Lyme disease prophylaxis.  Return precautions given   Lab Tests: None indicated  Imaging Studies ordered: None indicated  Cardiac Monitoring: / EKG: None indicated   Consultations Obtained: None indicated   Co morbidities that complicate the patient evaluation  None  Social Determinants of Health:  Tobacco usage              Final Clinical Impression(s) / ED Diagnoses Final diagnoses:  Cellulitis of other specified site    Rx / DC Orders ED Discharge Orders          Ordered    doxycycline (VIBRAMYCIN) 100 MG capsule  2 times daily        02/27/23 2354              Arabella Merles, PA-C 02/28/23 0013    Jacalyn Lefevre, MD 03/02/23 [phone]

## 2023-02-27 NOTE — ED Triage Notes (Signed)
Patient here from home reporting abscess to umbilicus.

## 2023-02-27 NOTE — ED Provider Notes (Incomplete)
[place] EMERGENCY DEPARTMENT AT [facility] Provider Note   CSN: [id number] Arrival date & time: 02/27/23  2304     History {Add pertinent medical, surgical, social history, OB history to HPI:1} Chief Complaint  Patient presents with  . Abscess    Jordan Ibarra is a 42 y.o. female who presents this evening with concern for a red bump on her bellybutton.  She states she was feeling her bellybutton and noticed the bump this evening.  There is no associated pain, fever, chills, discharge from the bump.  She states she cleans her bellybutton with Q-tips every day.  She also states she was bitten by a tick earlier this week with a obvious mark on her stomach.  She did see the tick and physically removed it.   Abscess      Home Medications Prior to Admission medications   Medication Sig Start Date End Date Taking? Authorizing Provider  acetaminophen (TYLENOL) 500 MG tablet Take 500-1,000 mg by mouth daily as needed for mild pain or headache.    [provider]  albuterol (VENTOLIN HFA) 108 (90 Base) MCG/ACT inhaler Inhale 1-2 puffs into the lungs every 6 (six) hours as needed for wheezing or shortness of breath.    [provider]  atorvastatin (LIPITOR) 10 MG tablet Take 10 mg by mouth daily. 04/30/22   [provider]  pantoprazole (PROTONIX) 40 MG tablet Take 1 tablet daily at least 30 minutes before first dose of Carafate. Patient taking differently: Take 40 mg by mouth See admin instructions. Take 40 mg by mouth once a day at least 30 minutes before first dose of Carafate 01/11/22   Molpus, John, MD  sucralfate (CARAFATE) 1 g tablet Take 1 tablet (1 g total) by mouth 4 (four) times daily -  with meals and at bedtime. 01/11/22   Molpus, John, MD      Allergies    Latex    Review of Systems   Review of Systems  Skin:        Bump    Physical Exam Updated Vital Signs BP 127/77 (BP Location: Right Arm)   Pulse 90   Temp 97.9 F (36.6  C) (Oral)   Resp 16   Ht 5\' 5"  (1.651 m)   Wt 90.7 kg   SpO2 95%   BMI 33.27 kg/m  Physical Exam  ED Results / Procedures / Treatments   Labs (all labs ordered are listed, but only abnormal results are displayed) Labs Reviewed - No data to display  EKG None  Radiology No results found.  Procedures Procedures  {Document cardiac monitor, telemetry assessment procedure when appropriate:1}  Medications Ordered in ED Medications - No data to display  ED Course/ Medical Decision Making/ A&P   {   Click here for ABCD2, HEART and other calculatorsREFRESH Note before signing :1}                          Medical Decision Making  ***  {Document critical care time when appropriate:1} {Document review of labs and clinical decision tools ie heart score, Chads2Vasc2 etc:1}  {Document your independent review of radiology images, and any outside records:1} {Document your discussion with family members, caretakers, and with consultants:1} {Document social determinants of health affecting pt's care:1} {Document your decision making why or why not admission, treatments were needed:1} Final Clinical Impression(s) / ED Diagnoses Final diagnoses:  None    Rx / DC Orders ED  Discharge Orders     None

## 2023-02-27 NOTE — Discharge Instructions (Addendum)
Please take your antibiotic, doxycycline, as directed.  Take the full course even if your bump has improved.  Antibiotics may cause some diarrhea. Please avoid the sun while on this antibiotic as this antibiotic may cause your skin to be more sensitive to sunlight.   Please return to the ER if your bump continues to get more red and swollen, the redness spreads, you develop fever or chills.

## 2023-03-25 ENCOUNTER — Other Ambulatory Visit: Payer: Self-pay

## 2023-03-25 ENCOUNTER — Emergency Department (HOSPITAL_COMMUNITY)
Admission: EM | Admit: 2023-03-25 | Discharge: 2023-03-26 | Disposition: A | Discharge status: Home / Self Care | Payer: Self-pay | Attending: Emergency Medicine | Admitting: Emergency Medicine

## 2023-03-25 DIAGNOSIS — K760 Fatty (change of) liver, not elsewhere classified: Secondary | ICD-10-CM | POA: Insufficient documentation

## 2023-03-25 DIAGNOSIS — K5790 Diverticulosis of intestine, part unspecified, without perforation or abscess without bleeding: Secondary | ICD-10-CM | POA: Insufficient documentation

## 2023-03-25 DIAGNOSIS — R102 Pelvic and perineal pain: Secondary | ICD-10-CM | POA: Insufficient documentation

## 2023-03-25 DIAGNOSIS — N8189 Other female genital prolapse: Secondary | ICD-10-CM | POA: Insufficient documentation

## 2023-03-25 LAB — URINALYSIS, ROUTINE W REFLEX MICROSCOPIC
Bilirubin Urine: NEGATIVE
Glucose, UA: NEGATIVE mg/dL
Hgb urine dipstick: NEGATIVE
Ketones, ur: NEGATIVE mg/dL
Leukocytes,Ua: NEGATIVE
Nitrite: NEGATIVE
Protein, ur: NEGATIVE mg/dL
Specific Gravity, Urine: 1.023 (ref 1.005–1.030)
pH: 6 (ref 5.0–8.0)

## 2023-03-25 NOTE — ED Triage Notes (Signed)
Pt reports pain to vaginal area that radiated to groin. Pt has hx of bladder prolapse and feels like "something is sticking out."

## 2023-03-25 NOTE — ED Provider Notes (Addendum)
[facility]-EMERGENCY DEPT [facility] Emergency Department Provider Note MRN:  [id number]  Arrival date & time: 03/26/23     Chief Complaint   Vaginal Pain   History of Present Illness   Jordan Ibarra is a 42 y.o. year-old female presents to the ED with chief complaint of sudden onset pelvic pain that started today.  Worse on the right side than on the left.  History of total hysterectomy and had subsequent bladder sling with mesh due to bladder prolapse.  States that the pain has been severe.  Denies nausea, vomiting, or fever.  States that when she was in the shower she was dribbling urine.  Was able to urinate normally in triage.  History provided by patient.   Review of Systems  Pertinent positive and negative review of systems noted in HPI.    Physical Exam   Vitals:   03/26/23 0210 03/26/23 0221  BP:    Pulse: 66   Resp:    Temp:  97.8 F (36.6 C)  SpO2: 97%     CONSTITUTIONAL:  well-appearing, NAD NEURO:  Alert and oriented x 3, CN 3-12 grossly intact EYES:  eyes equal and reactive ENT/NECK:  Supple, no stridor  CARDIO:  normal rate, appears well-perfused  PULM:  No respiratory distress GI/GU:  non-distended, RN chaperone present for exam, no discharge, no sores or lesions, there is a bulge at the 4 o'clock position of the vaginal introitus that is soft and boggy, no erythematous or significantly tender MSK/SPINE:  No gross deformities, no edema, moves all extremities  SKIN:  no rash, atraumatic   *Additional and/or pertinent findings included in MDM below  Diagnostic and Interventional Summary    EKG Interpretation Date/Time:    Ventricular Rate:    PR Interval:    QRS Duration:    QT Interval:    QTC Calculation:   R Axis:      Text Interpretation:         Labs Reviewed  COMPREHENSIVE METABOLIC PANEL - Abnormal; Notable for the following components:      Result Value   Glucose, Bld 113 (*)    BUN 21 (*)    Calcium 8.7 (*)    All other  components within normal limits  CBC WITH DIFFERENTIAL/PLATELET - Abnormal; Notable for the following components:   Hemoglobin 11.9 (*)    All other components within normal limits  LIPASE, BLOOD  URINALYSIS, ROUTINE W REFLEX MICROSCOPIC    CT ABDOMEN PELVIS W CONTRAST  Final Result  Addendum (preliminary) 1 of 1  ADDENDUM REPORT: 03/26/2023 01:41    ADDENDUM:  There is pelvic floor relaxation noted with mild prolapse of the  vaginal apex which appears 2 cm below the pubococcygeal line. No  significant prolapse of the anterior compartment is identified.  These findings may be better assessed with pelvic floor MRI  examination, if clinically indicated      Electronically Signed    By: Helyn Numbers M.D.    On: 03/26/2023 01:41      Final      Medications  morphine (PF) 4 MG/ML injection 4 mg (4 mg Intravenous Given 03/26/23 0054)  ondansetron (ZOFRAN) injection 4 mg (4 mg Intravenous Given 03/26/23 0054)  iohexol (OMNIPAQUE) 300 MG/ML solution 100 mL (100 mLs Intravenous Contrast Given 03/26/23 0110)     Procedures  /  Critical Care Procedures  ED Course and Medical Decision Making  I have reviewed the triage vital signs, the nursing notes, and pertinent  available records from the EMR.  Social Determinants Affecting Complexity of Care: Patient has no clinically significant social determinants affecting this chief complaint..   ED Course:    Medical Decision Making Patient here with pelvic pain that was sudden in onset.  She was primarly concerned about failure of her bladder sling/mesh.    On pelvic exam she does have boggy soft tissue at the 4 o'clock position that might be some mild prolapse.  Will check CT.  CT notable for pelvic floor relaxation.  Doesn't appear to have any emergent surgical process or need for admission or further workup tonight.  I've discussed the incidental CT findings with her and we discussed doing pelvic floor exercises and follow-up with  OBGYN.  She is agreeable with this plan.  Amount and/or Complexity of Data Reviewed Labs: ordered.    Details: UA is normal, no sign of infection. No leukocytosis Normal lipase No significant electrolyte derangement  Radiology: ordered.  Risk Prescription drug management.         Consultants: No consultations were needed in caring for this patient.   Treatment and Plan: I considered admission due to patient's initial presentation, but after considering the examination and diagnostic results, patient will not require admission and can be discharged with outpatient follow-up.  I discussed the case with Dr. Preston Fleeting, who agrees with discharge plan.  Final Clinical Impressions(s) / ED Diagnoses     ICD-10-CM   1. 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 Hepatic steatosis  K76.0     4. Diverticulosis  K57.90       ED Discharge Orders     None         Discharge Instructions Discussed with and Provided to Patient:     Discharge Instructions      You do have some relaxation of the pelvic floor, but no issues were seen with the bladder.  Please follow-up with your OBGYN for the vaginal pain.  Please discuss the incidental CT findings (diverticulosis and hepatic steatosis) with your PCP.        Roxy Horseman, PA-C 03/26/23 0241    Roxy Horseman, PA-C 03/26/23 0241    Dione Booze, MD 03/26/23 [phone]

## 2023-03-26 ENCOUNTER — Encounter (HOSPITAL_COMMUNITY): Payer: Self-pay

## 2023-03-26 ENCOUNTER — Emergency Department (HOSPITAL_COMMUNITY): Payer: Self-pay

## 2023-03-26 LAB — COMPREHENSIVE METABOLIC PANEL
ALT: 15 U/L (ref 0–44)
AST: 16 U/L (ref 15–41)
Albumin: 3.8 g/dL (ref 3.5–5.0)
Alkaline Phosphatase: 70 U/L (ref 38–126)
Anion gap: 8 (ref 5–15)
BUN: 21 mg/dL — ABNORMAL HIGH (ref 6–20)
CO2: 24 mmol/L (ref 22–32)
Calcium: 8.7 mg/dL — ABNORMAL LOW (ref 8.9–10.3)
Chloride: 110 mmol/L (ref 98–111)
Creatinine, Ser: 0.93 mg/dL (ref 0.44–1.00)
GFR, Estimated: >60 mL/min (ref >60)
Glucose, Bld: 113 mg/dL — ABNORMAL HIGH (ref 70–99)
Potassium: 3.5 mmol/L (ref 3.5–5.1)
Sodium: 142 mmol/L (ref 135–145)
Total Bilirubin: 0.4 mg/dL (ref 0.3–1.2)
Total Protein: 6.8 g/dL (ref 6.5–8.1)

## 2023-03-26 LAB — CBC WITH DIFFERENTIAL/PLATELET
Abs Immature Granulocytes: 0.07 10*3/uL (ref 0.00–0.07)
Basophils Absolute: 0.1 10*3/uL (ref 0.0–0.1)
Basophils Relative: 1 %
Eosinophils Absolute: 0.1 10*3/uL (ref 0.0–0.5)
Eosinophils Relative: 1 %
HCT: 37.2 % (ref 36.0–46.0)
Hemoglobin: 11.9 g/dL — ABNORMAL LOW (ref 12.0–15.0)
Immature Granulocytes: 1 %
Lymphocytes Relative: 35 %
Lymphs Abs: 2.9 10*3/uL (ref 0.7–4.0)
MCH: 27.7 pg (ref 26.0–34.0)
MCHC: 32 g/dL (ref 30.0–36.0)
MCV: 86.7 fL (ref 80.0–100.0)
Monocytes Absolute: 0.6 10*3/uL (ref 0.1–1.0)
Monocytes Relative: 7 %
Neutro Abs: 4.7 10*3/uL (ref 1.7–7.7)
Neutrophils Relative %: 55 %
Platelets: 255 10*3/uL (ref 150–400)
RBC: 4.29 MIL/uL (ref 3.87–5.11)
RDW: 13.3 % (ref 11.5–15.5)
WBC: 8.4 10*3/uL (ref 4.0–10.5)
nRBC: 0 % (ref 0.0–0.2)

## 2023-03-26 LAB — LIPASE, BLOOD: Lipase: 30 U/L (ref 11–51)

## 2023-03-26 MED ORDER — IOHEXOL 300 MG/ML  SOLN
100.0000 mL | Freq: Once | INTRAMUSCULAR | Status: AC | PRN
  Administered 2023-03-26: 100 mL via INTRAVENOUS

## 2023-03-26 MED ORDER — MORPHINE SULFATE (PF) 4 MG/ML IV SOLN
4.0000 mg | Freq: Once | INTRAVENOUS | Status: AC
  Administered 2023-03-26: 4 mg via INTRAVENOUS
  Filled 2023-03-26: qty 1

## 2023-03-26 MED ORDER — ONDANSETRON HCL 4 MG/2ML IJ SOLN
4.0000 mg | Freq: Once | INTRAMUSCULAR | Status: AC
  Administered 2023-03-26: 4 mg via INTRAVENOUS
  Filled 2023-03-26: qty 2

## 2023-03-26 MED ORDER — SODIUM CHLORIDE (PF) 0.9 % IJ SOLN
INTRAMUSCULAR | Status: AC
  Filled 2023-03-26: qty 50

## 2023-03-26 NOTE — Discharge Instructions (Signed)
You do have some relaxation of the pelvic floor, but no issues were seen with the bladder.  Please follow-up with your OBGYN for the vaginal pain.  Please discuss the incidental CT findings (diverticulosis and hepatic steatosis) with your PCP.

## 2023-04-18 ENCOUNTER — Encounter (HOSPITAL_COMMUNITY): Payer: Self-pay

## 2023-04-18 ENCOUNTER — Emergency Department (HOSPITAL_COMMUNITY)
Admission: EM | Admit: 2023-04-18 | Discharge: 2023-04-19 | Disposition: A | Discharge status: Home / Self Care | Payer: Self-pay | Attending: Emergency Medicine | Admitting: Emergency Medicine

## 2023-04-18 ENCOUNTER — Other Ambulatory Visit: Payer: Self-pay

## 2023-04-18 DIAGNOSIS — R319 Hematuria, unspecified: Secondary | ICD-10-CM | POA: Insufficient documentation

## 2023-04-18 LAB — CBC WITH DIFFERENTIAL/PLATELET
Abs Immature Granulocytes: 0.03 10*3/uL (ref 0.00–0.07)
Basophils Absolute: 0.1 10*3/uL (ref 0.0–0.1)
Basophils Relative: 1 %
Eosinophils Absolute: 0.1 10*3/uL (ref 0.0–0.5)
Eosinophils Relative: 1 %
HCT: 40.3 % (ref 36.0–46.0)
Hemoglobin: 13.1 g/dL (ref 12.0–15.0)
Immature Granulocytes: 0 %
Lymphocytes Relative: 34 %
Lymphs Abs: 3.2 10*3/uL (ref 0.7–4.0)
MCH: 28.2 pg (ref 26.0–34.0)
MCHC: 32.5 g/dL (ref 30.0–36.0)
MCV: 86.7 fL (ref 80.0–100.0)
Monocytes Absolute: 0.6 10*3/uL (ref 0.1–1.0)
Monocytes Relative: 6 %
Neutro Abs: 5.4 10*3/uL (ref 1.7–7.7)
Neutrophils Relative %: 58 %
Platelets: 294 10*3/uL (ref 150–400)
RBC: 4.65 MIL/uL (ref 3.87–5.11)
RDW: 13.4 % (ref 11.5–15.5)
WBC: 9.3 10*3/uL (ref 4.0–10.5)
nRBC: 0 % (ref 0.0–0.2)

## 2023-04-18 LAB — COMPREHENSIVE METABOLIC PANEL
ALT: 21 U/L (ref 0–44)
AST: 20 U/L (ref 15–41)
Albumin: 4.3 g/dL (ref 3.5–5.0)
Alkaline Phosphatase: 79 U/L (ref 38–126)
Anion gap: 9 (ref 5–15)
BUN: 25 mg/dL — ABNORMAL HIGH (ref 6–20)
CO2: 27 mmol/L (ref 22–32)
Calcium: 9.7 mg/dL (ref 8.9–10.3)
Chloride: 103 mmol/L (ref 98–111)
Creatinine, Ser: 1.2 mg/dL — ABNORMAL HIGH (ref 0.44–1.00)
GFR, Estimated: 58 mL/min — ABNORMAL LOW (ref >60)
Glucose, Bld: 116 mg/dL — ABNORMAL HIGH (ref 70–99)
Potassium: 3.7 mmol/L (ref 3.5–5.1)
Sodium: 139 mmol/L (ref 135–145)
Total Bilirubin: 0.3 mg/dL (ref 0.3–1.2)
Total Protein: 7.9 g/dL (ref 6.5–8.1)

## 2023-04-18 NOTE — ED Triage Notes (Signed)
Arrived POV. C/o vaginal bleeding starting about 2 hours ago. Denies blood clots. Hx hysterectomy in 2013. C/o vaginal discomfort.

## 2023-04-19 ENCOUNTER — Emergency Department (HOSPITAL_COMMUNITY): Payer: Self-pay

## 2023-04-19 LAB — URINALYSIS, ROUTINE W REFLEX MICROSCOPIC
Bacteria, UA: NONE SEEN
Bilirubin Urine: NEGATIVE
Glucose, UA: NEGATIVE mg/dL
Ketones, ur: NEGATIVE mg/dL
Nitrite: NEGATIVE
Protein, ur: NEGATIVE mg/dL
Specific Gravity, Urine: 1.019 (ref 1.005–1.030)
pH: 5 (ref 5.0–8.0)

## 2023-04-19 MED ORDER — IOHEXOL 300 MG/ML  SOLN: 100.0000 mL | Freq: Once | INTRAMUSCULAR | Status: DC | PRN

## 2023-04-19 MED ORDER — SODIUM CHLORIDE 0.9 % IV BOLUS
1000.0000 mL | Freq: Once | INTRAVENOUS | Status: AC
  Administered 2023-04-19: 1000 mL via INTRAVENOUS

## 2023-04-19 MED ORDER — FENTANYL CITRATE PF 50 MCG/ML IJ SOSY
50.0000 ug | PREFILLED_SYRINGE | Freq: Once | INTRAMUSCULAR | Status: AC
  Administered 2023-04-19: 50 ug via INTRAVENOUS
  Filled 2023-04-19: qty 1

## 2023-04-19 MED ORDER — SODIUM CHLORIDE 0.9 % IV BOLUS: 500.0000 mL | Freq: Once | INTRAVENOUS | Status: DC

## 2023-04-19 MED ORDER — CEFUROXIME AXETIL 500 MG PO TABS: 500.0000 mg | ORAL_TABLET | Freq: Two times a day (BID) | ORAL | 0 refills | Status: AC

## 2023-04-19 MED ORDER — CEFDINIR 300 MG PO CAPS
600.0000 mg | ORAL_CAPSULE | Freq: Once | ORAL | Status: AC
  Administered 2023-04-19: 600 mg via ORAL
  Filled 2023-04-19: qty 2

## 2023-04-19 MED ORDER — SODIUM CHLORIDE 0.9 % IV SOLN: 1.0000 g | Freq: Once | INTRAVENOUS | Status: DC

## 2023-04-19 NOTE — ED Provider Notes (Signed)
[place] EMERGENCY DEPARTMENT AT [facility] Provider Note   CSN: [id number] Arrival date & time: 04/18/23  2218     History  Chief Complaint  Patient presents with   Vaginal Bleeding    Jordan Ibarra is a 42 y.o. female status post hysterectomy in 2013, who presents to ED with concern for sensation that she needs to urinate but unable to pass urine since 3 PM.  Also states that she has had blood on the toilet paper when she wiped x 2 today.   No dysuria, in a monogomous relationship with her husband, no vaginal discharge at this time, states they are not sexually active.   HPI     Home Medications Prior to Admission medications   Medication Sig Start Date End Date Taking? Authorizing Provider  acetaminophen (TYLENOL) 500 MG tablet Take 500-1,000 mg by mouth daily as needed for mild pain or headache.   Yes [provider]  cefUROXime (CEFTIN) 500 MG tablet Take 1 tablet (500 mg total) by mouth 2 (two) times daily with a meal for 5 days. 04/19/23 04/24/23 Yes Ladasha Schnackenberg, Lupe Carney R, PA-C  pantoprazole (PROTONIX) 40 MG tablet Take 1 tablet daily at least 30 minutes before first dose of Carafate. Patient not taking: Reported on 04/19/2023 01/11/22   Molpus, Jonny Ruiz, MD  sucralfate (CARAFATE) 1 g tablet Take 1 tablet (1 g total) by mouth 4 (four) times daily -  with meals and at bedtime. Patient not taking: Reported on 04/19/2023 01/11/22   Molpus, Jonny Ruiz, MD      Allergies    Latex    Review of Systems   Review of Systems  Constitutional: Negative.   HENT: Negative.    Respiratory: Negative.    Cardiovascular: Negative.   Gastrointestinal: Negative.   Genitourinary:  Positive for decreased urine volume, difficulty urinating, dysuria, hematuria and urgency. Negative for flank pain and frequency.    Physical Exam Updated Vital Signs BP (!) 119/59 (BP Location: Right Arm)   Pulse 70   Temp 97.7 F (36.5 C) (Oral)   Resp 18   Ht 5\' 5"  (1.651 m)   Wt 90.3 kg    SpO2 95%   BMI 33.12 kg/m  Physical Exam Vitals and nursing note reviewed.  Constitutional:      Appearance: She is not ill-appearing or toxic-appearing.  HENT:     Head: Normocephalic and atraumatic.     Mouth/Throat:     Mouth: Mucous membranes are moist.     Pharynx: No oropharyngeal exudate or posterior oropharyngeal erythema.  Eyes:     General: No scleral icterus.       Right eye: No discharge.        Left eye: No discharge.     Extraocular Movements: Extraocular movements intact.     Conjunctiva/sclera: Conjunctivae normal.     Pupils: Pupils are equal, round, and reactive to light.  Cardiovascular:     Rate and Rhythm: Normal rate and regular rhythm.     Pulses: Normal pulses.     Heart sounds: Normal heart sounds. No murmur heard. Pulmonary:     Effort: Pulmonary effort is normal. No respiratory distress.     Breath sounds: Normal breath sounds. No wheezing or rales.  Abdominal:     General: Bowel sounds are normal. There is no distension.     Palpations: Abdomen is soft.     Tenderness: There is no abdominal tenderness. There is no right CVA tenderness, left CVA tenderness or guarding.  Musculoskeletal:        General: No deformity.     Cervical back: Neck supple.     Right lower leg: No edema.     Left lower leg: No edema.  Skin:    General: Skin is warm and dry.     Capillary Refill: Capillary refill takes less than 2 seconds.  Neurological:     General: No focal deficit present.     Mental Status: She is alert and oriented to person, place, and time. Mental status is at baseline.  Psychiatric:        Mood and Affect: Mood normal.     ED Results / Procedures / Treatments   Labs (all labs ordered are listed, but only abnormal results are displayed) Labs Reviewed  COMPREHENSIVE METABOLIC PANEL - Abnormal; Notable for the following components:      Result Value   Glucose, Bld 116 (*)    BUN 25 (*)    Creatinine, Ser 1.20 (*)    GFR, Estimated 58 (*)     All other components within normal limits  URINALYSIS, ROUTINE W REFLEX MICROSCOPIC - Abnormal; Notable for the following components:   Hgb urine dipstick SMALL (*)    Leukocytes,Ua MODERATE (*)    All other components within normal limits  URINE CULTURE  CBC WITH DIFFERENTIAL/PLATELET    EKG None  Radiology CT RENAL STONE STUDY  Result Date: 04/19/2023 CLINICAL DATA:  Vaginal bleeding and left lower quadrant pain, initial encounter EXAM: CT ABDOMEN AND PELVIS WITHOUT CONTRAST TECHNIQUE: Multidetector CT imaging of the abdomen and pelvis was performed following the standard protocol without IV contrast. RADIATION DOSE REDUCTION: This exam was performed according to the departmental dose-optimization program which includes automated exposure control, adjustment of the mA and/or kV according to patient size and/or use of iterative reconstruction technique. COMPARISON:  03/26/2023 FINDINGS: Lower chest: Mild right basilar atelectasis is noted. Hepatobiliary: No focal liver abnormality is seen. No gallstones, gallbladder wall thickening, or biliary dilatation. Pancreas: Unremarkable. No pancreatic ductal dilatation or surrounding inflammatory changes. Spleen: Normal in size without focal abnormality. Adrenals/Urinary Tract: Adrenal glands are within normal limits. Kidneys are well visualized bilaterally. No renal calculi or obstructive changes are seen. Bladder is within normal limits. Stomach/Bowel: Diverticular change of the colon is noted without evidence of diverticulitis. The appendix has been surgically removed. Small bowel and stomach are within normal limits. Vascular/Lymphatic: No significant vascular findings are present. No enlarged abdominal or pelvic lymph nodes. Reproductive: Status post hysterectomy. No adnexal masses. Other: No abdominal wall hernia or abnormality. No abdominopelvic ascites. Musculoskeletal: No acute or significant osseous findings. IMPRESSION: Diverticulosis without  diverticulitis. No other focal abnormality is noted. Electronically Signed   By: Alcide Clever M.D.   On: 04/19/2023 03:21    Procedures Procedures    Medications Ordered in ED Medications  sodium chloride 0.9 % bolus 1,000 mL (0 mLs Intravenous Stopped 04/19/23 0340)  fentaNYL (SUBLIMAZE) injection 50 mcg (50 mcg Intravenous Given 04/19/23 0241)  cefdinir (OMNICEF) capsule 600 mg (600 mg Oral Given 04/19/23 0617)    ED Course/ Medical Decision Making/ A&P                                 Medical Decision Making 42 y/o female who presents with concern for ? Of hematuria.   VS normal on intake, cardiopulmonary exam is normal, very mild LLQ/pelvic pain.   DDx includes limited to  UTI, nephrolithiasis/ureterolithiasis, neoplasm, vulvovaginitis.  Amount and/or Complexity of Data Reviewed Labs: ordered.    Details: CBC without leukocytosis or anemia, CMP with mild elevation in Cr to 1.2.  UA not convincing for infection Radiology: ordered.    Details: CT diverticulosis without diverticulitis.   Risk Prescription drug management.  Patient able to provide urine sample in the ED. Pain improved after IV medication.  UA not convincing for infection, suspect possible small ureterolithiasis now passed, however given patient's dysuria will cover for urinary tract infection.  Urine culture ordered.  Clinical concern for emergent underlying etiology that warrant further ED workup and patient management is exceedingly low.  Tolerating p.o. Carlisle  voiced understanding of her medical evaluation and treatment plan. Each of their questions answered to their expressed satisfaction.  Return precautions were given.  Patient is well-appearing, stable, and was discharged in good condition.  This chart was dictated using voice recognition software, Dragon. Despite the best efforts of this provider to proofread and correct errors, errors may still occur which can change documentation meaning. Final  Clinical Impression(s) / ED Diagnoses Final diagnoses:  Hematuria, unspecified type    Rx / DC Orders ED Discharge Orders          Ordered    cefUROXime (CEFTIN) 500 MG tablet  2 times daily with meals        04/19/23 0557              [place] Roker, Eugene Gavia, PA-C 04/19/23 0647    Palumbo, April, MD 04/19/23 [phone]

## 2023-04-19 NOTE — ED Notes (Signed)
Bladder scan twice "0"ml

## 2023-04-19 NOTE — Discharge Instructions (Addendum)
You were seen in the ER today for your blood in your urine and your difficulty urinating.  It is possible a urinary tract infection or that you had a small kidney stone which you have not passed.  Please take the prescribed antibiotic for the entire course and follow-up with your primary care doctor.  You may also follow-up with the urologist listed below.  Return to the ER with any severe symptoms.

## 2023-12-31 ENCOUNTER — Other Ambulatory Visit: Payer: Self-pay

## 2023-12-31 ENCOUNTER — Emergency Department (HOSPITAL_COMMUNITY)
Admission: EM | Admit: 2023-12-31 | Discharge: 2024-01-01 | Disposition: A | Discharge status: Home / Self Care | Payer: Self-pay | Attending: Emergency Medicine | Admitting: Emergency Medicine

## 2023-12-31 ENCOUNTER — Encounter (HOSPITAL_COMMUNITY): Payer: Self-pay | Admitting: Emergency Medicine

## 2023-12-31 DIAGNOSIS — K5730 Diverticulosis of large intestine without perforation or abscess without bleeding: Secondary | ICD-10-CM | POA: Insufficient documentation

## 2023-12-31 DIAGNOSIS — R1031 Right lower quadrant pain: Secondary | ICD-10-CM

## 2023-12-31 DIAGNOSIS — Z91040 Latex allergy status: Secondary | ICD-10-CM | POA: Insufficient documentation

## 2023-12-31 DIAGNOSIS — K5790 Diverticulosis of intestine, part unspecified, without perforation or abscess without bleeding: Secondary | ICD-10-CM

## 2023-12-31 MED ORDER — HYDROCODONE-ACETAMINOPHEN 5-325 MG PO TABS
1.0000 | ORAL_TABLET | Freq: Once | ORAL | Status: AC
  Administered 2023-12-31: 1 via ORAL
  Filled 2023-12-31: qty 1

## 2023-12-31 MED ORDER — ONDANSETRON 8 MG PO TBDP
8.0000 mg | ORAL_TABLET | Freq: Once | ORAL | Status: AC
  Administered 2023-12-31: 8 mg via ORAL
  Filled 2023-12-31: qty 1

## 2023-12-31 NOTE — ED Notes (Signed)
 Labs were attempted, unsuccessful stick

## 2023-12-31 NOTE — ED Notes (Signed)
 Wallis Gun, MD notified this RN to hold off on blood draw for labs at this time. He would circle back after medication and urine sample.

## 2023-12-31 NOTE — ED Triage Notes (Signed)
 Pt in with sharp RLQ pain that woke her up last night, persistently worse since then. Pt states last night the pain came on suddenly and she felt urge to have a BM. Hx of appendectomy and hysterectomy. Denies any n/v/d, urinary problems or fevers

## 2023-12-31 NOTE — ED Provider Notes (Signed)
 [place] EMERGENCY DEPARTMENT AT [facility] Provider Note   CSN: [id number] Arrival date & time: 12/31/23  2152     History {Add pertinent medical, surgical, social history, OB history to HPI:1} Chief Complaint  Patient presents with   Abdominal Pain    Jordan Ibarra is a 43 y.o. female.  The history is provided by the patient.  Patient w/history of GERD presents with abdominal pain.  Patient reports around 2 AM on April 24 that she started having right lower quadrant abdominal pain.  She had a bowel movement with small amount of stool passed with some pain at that time.  No bloody or dark stools.  No fevers or vomiting.  No dysuria.  The pain has remained in the right lower quadrant without radiation.  She has not had this pain previously. Patient has had previous TAH-BSO, appendectomy No previous history of kidney stones   Past Medical History:  Diagnosis Date   GERD (gastroesophageal reflux disease)    otc  Tums    Home Medications Prior to Admission medications   Medication Sig Start Date End Date Taking? Authorizing Provider  acetaminophen  (TYLENOL ) 500 MG tablet Take 500-1,000 mg by mouth daily as needed for mild pain or headache.    [provider]  pantoprazole  (PROTONIX ) 40 MG tablet Take 1 tablet daily at least 30 minutes before first dose of Carafate . Patient not taking: Reported on 04/19/2023 01/11/22   Molpus, Autry Legions, MD  sucralfate  (CARAFATE ) 1 g tablet Take 1 tablet (1 g total) by mouth 4 (four) times daily -  with meals and at bedtime. Patient not taking: Reported on 04/19/2023 01/11/22   Molpus, Autry Legions, MD      Allergies    Latex    Review of Systems   Review of Systems  Constitutional:  Negative for fever.  Cardiovascular:  Negative for chest pain.  Gastrointestinal:  Negative for vomiting.  Genitourinary:  Negative for dysuria.    Physical Exam Updated Vital Signs BP 112/68   Pulse 75   Temp 97.8 F (36.6 C) (Oral)   Resp 20    Wt 88.5 kg   SpO2 97%   BMI 32.45 kg/m  Physical Exam CONSTITUTIONAL: Well developed/well nourished HEAD: Normocephalic/atraumatic ENMT: Mucous membranes moist NECK: supple no meningeal signs CV: S1/S2 noted, no murmurs/rubs/gallops noted LUNGS: Lungs are clear to auscultation bilaterally, no apparent distress ABDOMEN: soft, mild RLQ tenderness, no rebound or guarding, bowel sounds noted throughout abdomen GU:no cva tenderness NEURO: Pt is awake/alert/appropriate, moves all extremitiesx4.  No facial droop.   EXTREMITIES: pulses normal/equal, full ROM SKIN: warm, color normal PSYCH: no abnormalities of mood noted, alert and oriented to situation  ED Results / Procedures / Treatments   Labs (all labs ordered are listed, but only abnormal results are displayed) Labs Reviewed  LIPASE, BLOOD  COMPREHENSIVE METABOLIC PANEL WITH GFR  CBC  URINALYSIS, ROUTINE W REFLEX MICROSCOPIC    EKG None  Radiology No results found.  Procedures Procedures  {Document cardiac monitor, telemetry assessment procedure when appropriate:1}  Medications Ordered in ED Medications  ondansetron  (ZOFRAN -ODT) disintegrating tablet 8 mg (8 mg Oral Given 12/31/23 2348)  HYDROcodone -acetaminophen  (NORCO/VICODIN) 5-325 MG per tablet 1 tablet (1 tablet Oral Given 12/31/23 2347)    ED Course/ Medical Decision Making/ A&P   {   Click here for ABCD2, HEART and other calculatorsREFRESH Note before signing :1}  Medical Decision Making Amount and/or Complexity of Data Reviewed Labs: ordered.  Risk Prescription drug management.   This patient presents to the ED for concern of abdominal pain, this involves an extensive number of treatment options, and is a complaint that carries with it a high risk of complications and morbidity.  The differential diagnosis includes but is not limited to cholecystitis, cholelithiasis, pancreatitis, gastritis, peptic ulcer disease, , bowel  obstruction, bowel perforation, diverticulitis, AAA, ischemic bowel    Comorbidities that complicate the patient evaluation: Patient's presentation is complicated by their history of GERD  Social Determinants of Health: Patient's lack of prescription access  increases the complexity of managing their presentation  Additional history obtained: Records reviewed  previous radiology results reviewed  Lab Tests: I Ordered, and personally interpreted labs.  The pertinent results include:  ***  Imaging Studies ordered: I ordered imaging studies including {imaging:26848}  I independently visualized and interpreted imaging which showed *** I agree with the radiologist interpretation  Cardiac Monitoring: The patient was maintained on a cardiac monitor.  I personally viewed and interpreted the cardiac monitor which showed an underlying rhythm of:  {cardiac monitor:26849}  Medicines ordered and prescription drug management: I ordered medication including vicodin for pain Reevaluation of the patient after these medicines showed that the patient    {resolved/improved/worsened:23923::"improved"}  Test Considered: Patient is low risk / negative by ***, therefore do not feel that *** is indicated.  Critical Interventions:  ***  Consultations Obtained: I requested consultation with the {consultation:26851}, and discussed  findings as well as pertinent plan - they recommend: ***  Reevaluation: After the interventions noted above, I reevaluated the patient and found that they have :{resolved/improved/worsened:23923::"improved"}  Complexity of problems addressed: Patient's presentation is most consistent with  {ZOXW:96045}  Disposition: After consideration of the diagnostic results and the patient's response to treatment,  I feel that the patent would benefit from {disposition:26850}.     {Document critical care time when appropriate:1} {Document review of labs and clinical decision  tools ie heart score, Chads2Vasc2 etc:1}  {Document your independent review of radiology images, and any outside records:1} {Document your discussion with family members, caretakers, and with consultants:1} {Document social determinants of health affecting pt's care:1} {Document your decision making why or why not admission, treatments were needed:1} Final Clinical Impression(s) / ED Diagnoses Final diagnoses:  None    Rx / DC Orders ED Discharge Orders     None

## 2024-01-01 ENCOUNTER — Emergency Department (HOSPITAL_COMMUNITY): Payer: Self-pay

## 2024-01-01 LAB — URINALYSIS, ROUTINE W REFLEX MICROSCOPIC
Bilirubin Urine: NEGATIVE
Glucose, UA: NEGATIVE mg/dL
Hgb urine dipstick: NEGATIVE
Ketones, ur: NEGATIVE mg/dL
Leukocytes,Ua: NEGATIVE
Nitrite: NEGATIVE
Protein, ur: NEGATIVE mg/dL
Specific Gravity, Urine: 1.025 (ref 1.005–1.030)
pH: 5 (ref 5.0–8.0)

## 2024-01-01 MED ORDER — AMOXICILLIN-POT CLAVULANATE 875-125 MG PO TABS: 1.0000 | ORAL_TABLET | Freq: Two times a day (BID) | ORAL | 0 refills | Status: AC

## 2024-01-01 NOTE — ED Notes (Signed)
 Pt ambulated with steady gait to bathroom and provided urine sample. Sample sent off to lab

## 2024-01-01 NOTE — Discharge Instructions (Addendum)
 As we discussed, if your pain is still present by Saturday morning you can start the antibiotic You will need to have a colonoscopy in the next year.   SEEK IMMEDIATE MEDICAL ATTENTION IF: The pain does not go away or becomes severe, particularly over the next 8-12 hours.  A temperature above 100.55F develops.  Repeated vomiting occurs (multiple episodes).  The pain becomes localized to portions of the abdomen. In an adult, the left lower portion of the abdomen could be colitis or diverticulitis.  Blood is being passed in stools or vomit (bright red or black tarry stools).  Return also if you develop chest pain, difficulty breathing, dizziness or fainting, or become confused, poorly responsive, or inconsolable.

## 2024-02-19 ENCOUNTER — Emergency Department (HOSPITAL_COMMUNITY): Payer: Self-pay

## 2024-02-19 ENCOUNTER — Emergency Department (HOSPITAL_COMMUNITY)
Admission: EM | Admit: 2024-02-19 | Discharge: 2024-02-20 | Disposition: A | Discharge status: Home / Self Care | Payer: Self-pay | Attending: Emergency Medicine | Admitting: Emergency Medicine

## 2024-02-19 DIAGNOSIS — Z91040 Latex allergy status: Secondary | ICD-10-CM | POA: Insufficient documentation

## 2024-02-19 DIAGNOSIS — R519 Headache, unspecified: Secondary | ICD-10-CM | POA: Insufficient documentation

## 2024-02-19 LAB — BASIC METABOLIC PANEL WITH GFR
Anion gap: 9 (ref 5–15)
BUN: 14 mg/dL (ref 6–20)
CO2: 27 mmol/L (ref 22–32)
Calcium: 8.9 mg/dL (ref 8.9–10.3)
Chloride: 103 mmol/L (ref 98–111)
Creatinine, Ser: 0.79 mg/dL (ref 0.44–1.00)
GFR, Estimated: >60 mL/min (ref >60)
Glucose, Bld: 101 mg/dL — ABNORMAL HIGH (ref 70–99)
Potassium: 4.3 mmol/L (ref 3.5–5.1)
Sodium: 139 mmol/L (ref 135–145)

## 2024-02-19 LAB — CBC
HCT: 46.8 % — ABNORMAL HIGH (ref 36.0–46.0)
Hemoglobin: 14.8 g/dL (ref 12.0–15.0)
MCH: 28 pg (ref 26.0–34.0)
MCHC: 31.6 g/dL (ref 30.0–36.0)
MCV: 88.5 fL (ref 80.0–100.0)
Platelets: 299 10*3/uL (ref 150–400)
RBC: 5.29 MIL/uL — ABNORMAL HIGH (ref 3.87–5.11)
RDW: 13 % (ref 11.5–15.5)
WBC: 9 10*3/uL (ref 4.0–10.5)
nRBC: 0 % (ref 0.0–0.2)

## 2024-02-19 MED ORDER — PROCHLORPERAZINE MALEATE 10 MG PO TABS
10.0000 mg | ORAL_TABLET | Freq: Once | ORAL | Status: AC
  Administered 2024-02-19: 10 mg via ORAL
  Filled 2024-02-19: qty 1

## 2024-02-19 MED ORDER — KETOROLAC TROMETHAMINE 15 MG/ML IJ SOLN
15.0000 mg | Freq: Once | INTRAMUSCULAR | Status: AC
  Administered 2024-02-19: 15 mg via INTRAVENOUS
  Filled 2024-02-19: qty 1

## 2024-02-19 MED ORDER — DIPHENHYDRAMINE HCL 50 MG/ML IJ SOLN
12.5000 mg | Freq: Once | INTRAMUSCULAR | Status: AC
  Administered 2024-02-19: 12.5 mg via INTRAVENOUS
  Filled 2024-02-19: qty 1

## 2024-02-19 MED ORDER — DEXAMETHASONE SODIUM PHOSPHATE 10 MG/ML IJ SOLN
10.0000 mg | Freq: Once | INTRAMUSCULAR | Status: AC
  Administered 2024-02-19: 10 mg via INTRAVENOUS
  Filled 2024-02-19: qty 1

## 2024-02-19 MED ORDER — IOHEXOL 350 MG/ML SOLN
75.0000 mL | Freq: Once | INTRAVENOUS | Status: AC | PRN
  Administered 2024-02-19: 75 mL via INTRAVENOUS

## 2024-02-19 NOTE — ED Provider Notes (Signed)
 [place] EMERGENCY DEPARTMENT AT [facility] Provider Note   CSN: [id number] Arrival date & time: 02/19/24  1829     Patient presents with: Headache   Jordan Ibarra is a 43 y.o. female.   Patient history of GERD presents today with complaints of headache.  She states that same began Monday afternoon when she was sitting on the couch.  She states that the pain came on suddenly and has been persistent since onset.  Pain is on the right side of her head and does not radiate.  She states she has had headaches before but they have never been this bad.  She has been taking Tylenol  and Goody's powder with no improvement.  She states her pain is worse when she sits up.  Denies any vision changes.  Does endorse a few episodes of nausea and vomiting.  Denies chest pain or shortness of breath.  No abdominal pain.  No fevers or chills.  The history is provided by the patient. No language interpreter was used.  Headache      Prior to Admission medications   Medication Sig Start Date End Date Taking? Authorizing Provider  acetaminophen  (TYLENOL ) 500 MG tablet Take 500-1,000 mg by mouth daily as needed for mild pain or headache.    [provider]  amoxicillin -clavulanate (AUGMENTIN ) 875-125 MG tablet Take 1 tablet by mouth every 12 (twelve) hours. 01/01/24   Eldon Greenland, MD  pantoprazole  (PROTONIX ) 40 MG tablet Take 1 tablet daily at least 30 minutes before first dose of Carafate . Patient not taking: Reported on 04/19/2023 01/11/22   Molpus, Autry Legions, MD  sucralfate  (CARAFATE ) 1 g tablet Take 1 tablet (1 g total) by mouth 4 (four) times daily -  with meals and at bedtime. Patient not taking: Reported on 04/19/2023 01/11/22   Molpus, Autry Legions, MD    Allergies: Latex    Review of Systems  Neurological:  Positive for headaches.  All other systems reviewed and are negative.   Updated Vital Signs BP 106/68   Pulse 79   Temp 98.2 F (36.8 C) (Oral)   Resp 18   Ht 5' 5 (1.651  m)   Wt 83.9 kg   SpO2 98%   BMI 30.79 kg/m   Physical Exam Vitals and nursing note reviewed.  Constitutional:      General: She is not in acute distress.    Appearance: Normal appearance. She is normal weight. She is not ill-appearing, toxic-appearing or diaphoretic.  HENT:     Head: Normocephalic and atraumatic.   Cardiovascular:     Rate and Rhythm: Normal rate.  Pulmonary:     Effort: Pulmonary effort is normal. No respiratory distress.   Musculoskeletal:        General: Normal range of motion.     Cervical back: Normal range of motion.   Skin:    General: Skin is warm and dry.   Neurological:     General: No focal deficit present.     Mental Status: She is alert and oriented to person, place, and time.     GCS: GCS eye subscore is 4. GCS verbal subscore is 5. GCS motor subscore is 6.     Sensory: Sensation is intact.     Motor: Motor function is intact.     Coordination: Coordination is intact.     Gait: Gait is intact.     Comments: Alert and oriented to self, place, time and event.    Speech is fluent, clear without  dysarthria or dysphasia.    Strength 5/5 in upper/lower extremities   Sensation intact in upper/lower extremities    CN I not tested  CN II grossly intact visual fields bilaterally. Did not visualize posterior eye.  CN III, IV, VI PERRLA and EOMs intact bilaterally  CN V Intact sensation to sharp and light touch to the face  CN VII facial movements symmetric  CN VIII not tested  CN IX, X no uvula deviation, symmetric rise of soft palate  CN XI 5/5 SCM and trapezius strength bilaterally  CN XII Midline tongue protrusion, symmetric L/R movements   Psychiatric:        Mood and Affect: Mood normal.        Behavior: Behavior normal.     (all labs ordered are listed, but only abnormal results are displayed) Labs Reviewed  CBC - Abnormal; Notable for the following components:      Result Value   RBC 5.29 (*)    HCT 46.8 (*)    All other  components within normal limits  BASIC METABOLIC PANEL WITH GFR - Abnormal; Notable for the following components:   Glucose, Bld 101 (*)    All other components within normal limits    EKG: None  Radiology: No results found.   Procedures   Medications Ordered in the ED  prochlorperazine  (COMPAZINE ) tablet 10 mg (10 mg Oral Given 02/19/24 2102)  diphenhydrAMINE  (BENADRYL ) injection 12.5 mg (12.5 mg Intravenous Given 02/19/24 2102)  dexamethasone  (DECADRON ) injection 10 mg (10 mg Intravenous Given 02/19/24 2102)  ketorolac  (TORADOL ) 15 MG/ML injection 15 mg (15 mg Intravenous Given 02/19/24 2101)  iohexol  (OMNIPAQUE ) 350 MG/ML injection 75 mL (75 mLs Intravenous Contrast Given 02/19/24 2214)                                    Medical Decision Making Amount and/or Complexity of Data Reviewed Labs: ordered. Radiology: ordered.  Risk Prescription drug management.   This patient is a 43 y.o. female who presents to the ED for concern of headache, this involves an extensive number of treatment options, and is a complaint that carries with it a high risk of complications and morbidity. The emergent differential diagnosis prior to evaluation includes, but is not limited to,  Stroke, increased ICP, meningitis, CVA, intracranial tumor, venous sinus thrombosis, migraine, cluster headache, hypertension, drug related, head injury, tension headache, sinusitis, TMJ, trigeminal neuralgia.  This is not an exhaustive differential.   Past Medical History / Co-morbidities / Social History:  has a past medical history of GERD (gastroesophageal reflux disease).  Patient with history of hysterectomy, therefore will not order a pregnancy test.  Additional history: Chart reviewed. Pertinent results include: Seen in 2023 for headache, had CT imaging that was normal, given headache cocktail and discharged home.  Discussed this with patient.  She states this headache feels different.  Physical  Exam: Physical exam performed. The pertinent findings include: Alert and oriented and neurologically intact without focal deficits.  Lab Tests: I ordered, and personally interpreted labs.  The pertinent results include: No acute laboratory abnormalities.   Imaging Studies: I ordered imaging studies including CTA head and neck..   This imaging is pending at shift change.  Medications: I ordered medication including Compazine , Benadryl , Decadron , and Toradol  for headache. Reevaluation of the patient after these medicines is pending at shift change.   Disposition:  Patient CT is pending at shift change  and will determine dispo.  If normal and patient feels better, suspect likely discharge.  Care handoff to Atlee Leach, PA-C at shift change.  Please see their note for continued evaluation and dispo.  Final diagnoses:  Bad headache    ED Discharge Orders     None          Fredna Jasper 02/19/24 2242    Nicklas Barns, MD 02/19/24 2358

## 2024-02-19 NOTE — Discharge Instructions (Addendum)
 Take the prescribed medication as directed.  Can try combo of reglan /benadryl  and lots of fluids to see if this helps. Follow-up with neurology-- call for appt. Return to the ED for new or worsening symptoms.

## 2024-02-19 NOTE — ED Triage Notes (Addendum)
 Monday began to get headache and tried to rest to abate it. Felt little better but has gotten worse as week has gone on. Took tylenol  but not helping so stopped it. Pt reports onset of nausea and diaphoresis today. Denies dizziness. A&O x 4. 9/10 all over top of head pain. Denies hx of migraines.

## 2024-02-19 NOTE — ED Notes (Signed)
 Patient transported to CT

## 2024-02-20 MED ORDER — METOCLOPRAMIDE HCL 10 MG PO TABS: 10.0000 mg | ORAL_TABLET | Freq: Four times a day (QID) | ORAL | 0 refills | Status: AC

## 2024-02-20 MED ORDER — DIPHENHYDRAMINE HCL 25 MG PO TABS: 25.0000 mg | ORAL_TABLET | Freq: Four times a day (QID) | ORAL | 0 refills | Status: AC

## 2024-02-20 NOTE — ED Provider Notes (Signed)
 Assumed care at shift change.  See prior notes for full H&P.  Briefly, 43 y.o. F here with headache.  Given migraine cocktail, labs reassuring.  Plan:  CTA head/neck, assume discharge if feeling better.  Results for orders placed or performed during the hospital encounter of 02/19/24  CBC   Collection Time: 02/19/24  9:07 PM  Result Value Ref Range   WBC 9.0 4.0 - 10.5 K/uL   RBC 5.29 (H) 3.87 - 5.11 MIL/uL   Hemoglobin 14.8 12.0 - 15.0 g/dL   HCT 09.8 (H) 11.9 - 14.7 %   MCV 88.5 80.0 - 100.0 fL   MCH 28.0 26.0 - 34.0 pg   MCHC 31.6 30.0 - 36.0 g/dL   RDW 82.9 56.2 - 13.0 %   Platelets 299 150 - 400 K/uL   nRBC 0.0 0.0 - 0.2 %  Basic metabolic panel   Collection Time: 02/19/24  9:07 PM  Result Value Ref Range   Sodium 139 135 - 145 mmol/L   Potassium 4.3 3.5 - 5.1 mmol/L   Chloride 103 98 - 111 mmol/L   CO2 27 22 - 32 mmol/L   Glucose, Bld 101 (H) 70 - 99 mg/dL   BUN 14 6 - 20 mg/dL   Creatinine, Ser 8.65 0.44 - 1.00 mg/dL   Calcium 8.9 8.9 - 78.4 mg/dL   GFR, Estimated >69 >62 mL/min   Anion gap 9 5 - 15   CT ANGIO HEAD NECK W WO CM Result Date: 02/19/2024 CLINICAL DATA:  Initial evaluation for acute headache. EXAM: CT ANGIOGRAPHY HEAD AND NECK WITH AND WITHOUT CONTRAST TECHNIQUE: Multidetector CT imaging of the head and neck was performed using the standard protocol during bolus administration of intravenous contrast. Multiplanar CT image reconstructions and MIPs were obtained to evaluate the vascular anatomy. Carotid stenosis measurements (when applicable) are obtained utilizing NASCET criteria, using the distal internal carotid diameter as the denominator. RADIATION DOSE REDUCTION: This exam was performed according to the departmental dose-optimization program which includes automated exposure control, adjustment of the mA and/or kV according to patient size and/or use of iterative reconstruction technique. CONTRAST:  75mL OMNIPAQUE  IOHEXOL  350 MG/ML SOLN COMPARISON:  Prior  study from 08/16/2022. FINDINGS: CT HEAD FINDINGS Brain: Cerebral volume within normal limits for patient age. No acute intracranial hemorrhage. No acute large vessel territory infarct. No mass lesion, midline shift, or mass effect. Ventricles are normal in size without hydrocephalus. No extra-axial fluid collection. Vascular: No abnormal hyperdense vessel. Skull: Scalp soft tissues demonstrate no acute abnormality. Calvarium intact. Sinuses/Orbits: Globes and orbital soft tissues within normal limits. Visualized paranasal sinuses are largely clear. No significant mastoid effusion. CTA NECK FINDINGS Aortic arch: Standard branching. Imaged portion shows no evidence of aneurysm or dissection. No significant stenosis of the major arch vessel origins. Right carotid system: No evidence of dissection, stenosis (50% or greater), or occlusion. Left carotid system: No evidence of dissection, stenosis (50% or greater), or occlusion. Vertebral arteries: No evidence of dissection, stenosis (50% or greater), or occlusion. Skeleton: No worrisome osseous lesions.  Prior ACDF at C6-7. Other neck: Or other acute finding. Upper chest: No acute finding. Review of the MIP images confirms the above findings CTA HEAD FINDINGS Anterior circulation: Both internal carotid arteries widely patent to the termini without stenosis. A1 segments widely patent. Normal anterior communicating artery complex. Both anterior cerebral arteries widely patent to their distal aspects without stenosis. No M1 stenosis or occlusion. Normal MCA bifurcations. Distal MCA branches well perfused and symmetric. Posterior circulation:  Both V4 segments patent without significant stenosis. Left vertebral artery slightly dominant. Both PICA patent at their origins. Basilar patent without stenosis. Superior cerebral arteries patent bilaterally. Right PCA supplied primarily via the basilar. Predominant fetal type origin left PCA. Both PCAs patent to their distal aspects  without significant stenosis. Venous sinuses: Patent allowing for timing the contrast bolus. Anatomic variants: As above.  No aneurysm. Review of the MIP images confirms the above findings IMPRESSION: 1. Normal CTA of the head and neck. No large vessel occlusion, hemodynamically significant stenosis, or other acute vascular abnormality. No aneurysm. 2. No other acute intracranial abnormality. Electronically Signed   By: Virgia Griffins M.D.   On: 02/19/2024 23:41    2:18 AM Feeling better after headache cocktail.  Still has a mild headache but improved from prior.  No prior hx of same.  Can consider referral to neurology for further evaluation/work-up.  Plan for d/c home with continued symptomatic care.  Return here for new concerns.   Coretha Dew, PA-C 02/20/24 0228    Alissa April, MD 02/21/24 [phone]

## 2024-02-20 NOTE — ED Notes (Signed)
Patient verbalizes understanding of discharge instructions. Opportunity for questioning and answers were provided. Armband removed by staff, pt discharged from ED. Ambulated out to lobby, awaiting ride home

## 2024-03-01 ENCOUNTER — Encounter: Payer: Self-pay | Admitting: Neurology

## 2024-03-01 ENCOUNTER — Ambulatory Visit (INDEPENDENT_AMBULATORY_CARE_PROVIDER_SITE_OTHER): Payer: Self-pay | Admitting: Neurology

## 2024-03-01 VITALS — BP 100/67 | HR 70 | Ht 65.0 in | Wt 204.1 lb

## 2024-03-01 DIAGNOSIS — Z9189 Other specified personal risk factors, not elsewhere classified: Secondary | ICD-10-CM

## 2024-03-01 DIAGNOSIS — E66811 Obesity, class 1: Secondary | ICD-10-CM

## 2024-03-01 DIAGNOSIS — G479 Sleep disorder, unspecified: Secondary | ICD-10-CM

## 2024-03-01 DIAGNOSIS — R519 Headache, unspecified: Secondary | ICD-10-CM

## 2024-03-01 DIAGNOSIS — G4719 Other hypersomnia: Secondary | ICD-10-CM

## 2024-03-01 MED ORDER — AMITRIPTYLINE HCL 25 MG PO TABS: 25.0000 mg | ORAL_TABLET | Freq: Every day | ORAL | 3 refills | Status: AC

## 2024-03-01 NOTE — Progress Notes (Signed)
 Subjective:    Patient ID: Jordan Ibarra is a 43 y.o. female.  HPI     True Mar, MD, PhD [facility] [address], [address] [address] [place], [place] [address]  I saw patient, Jordan Ibarra, as a referral from the emergency room for evaluation of her headaches.  The patient is unaccompanied today.  Jordan Ibarra is a 43 year old female with an underlying medical history of reflux disease, and mild obesity, who reports a recurrent headache for the past 2 weeks approximately.  She does not typically have a history of recurrent headaches or migraines growing up.  She has no family history of migraines.  She denies any significant photophobia.  She has not had an eye examination in 3 to 4 years.  She does not currently have a PCP, her previous PCP retired and she did not have good communication with the PCP she saw after in the same clinic.  She denies any sudden onset one-sided weakness or numbness or tingling or droopy face or slurring of speech.  She has never had prescription eyeglasses and denies any visual disturbance.  She has tried the prescription nausea medicine, metoclopramide  recently but it makes her sleepy.  She does not sleep well currently.  She has had some trouble falling asleep due to the headache.  It is dull, achy, pressure-like, mainly on the top of her head.  She does not know if she snores, her husband uses a BiPAP machine and sleeps well with it and does not notice any snoring from her.  She tries to hydrate well with water.  She limits her caffeine  to 1 cup of coffee in the morning, she does not drink any alcohol. She presented to the emergency room on 02/19/2019 for with a complaint of headache which was worse when sitting up, denied any vision changes but reported some nausea and vomiting.  I reviewed the emergency room records.  She was treated symptomatically with Compazine , Benadryl , Decadron , Toradol . She was discharged with a prescription for  metoclopramide  and diphenhydramine .  She had a CT angiogram of the head and neck with and without contrast on 02/19/2024 and I reviewed the results:   IMPRESSION: 1. Normal CTA of the head and neck. No large vessel occlusion, hemodynamically significant stenosis, or other acute vascular abnormality. No aneurysm. 2. No other acute intracranial abnormality. She had a prior head CT without contrast on 08/16/2022 with indication of sudden onset headache for 2 days with nausea and vomiting, and I reviewed the results:  IMPRESSION: No acute intracranial process.  Her Past Medical History Is Significant For: Past Medical History:  Diagnosis Date   GERD (gastroesophageal reflux disease)    otc  Tums    Her Past Surgical History Is Significant For: Past Surgical History:  Procedure Laterality Date   ABDOMINAL HYSTERECTOMY     ANTERIOR CERVICAL DECOMP/DISCECTOMY FUSION N/A 01/22/2016   Procedure: Cervical six - seven Anterior cervical discectomy with fusion and plate fixation;  Surgeon: Morene Hicks Ditty, MD;  Location: [facility];  Service: Neurosurgery;  Laterality: N/A;  C6-7 Anterior cervical discectomy with fusion and plate fixation   APPENDECTOMY     bladder prolapse surgery     TONSILLECTOMY      Her Family History Is Significant For: Family History  Problem Relation Age of Onset   Kidney failure Mother    Scoliosis Mother    Ovarian cancer Sister    Lung cancer Maternal Grandmother    Migraines Neg  Hx    Sleep apnea Neg Hx    Headache Neg Hx     Her Social History Is Significant For: Social History   Socioeconomic History   Marital status: Married    Spouse name: Not on file   Number of children: Not on file   Years of education: Not on file   Highest education level: Not on file  Occupational History   Not on file  Tobacco Use   Smoking status: Former    Current packs/day: 0.15    Average packs/day: 0.2 packs/day for 22.0 years (3.3 ttl pk-yrs)    Types:  Cigarettes   Smokeless tobacco: Never  Vaping Use   Vaping status: Every Day  Substance and Sexual Activity   Alcohol use: Not Currently    Comment: Not that often   Drug use: No    Comment: none in 10 years   Sexual activity: Not on file    Comment: down to 3 cigeretts a day  Other Topics Concern   Not on file  Social History Narrative   Pt lives with family    Pt works    Social Drivers of Corporate investment banker Strain: Not on file  Food Insecurity: No Food Insecurity (08/01/2022)   Received from [facility]   Hunger Vital Sign  Transportation Needs: No Transportation Needs (08/01/2022)   Received from [facility]   PRAPARE - Transportation  Physical Activity: Not on file  Stress: Not on file  Social Connections: Unknown (01/10/2022)   [facility] from [facility]   Social Network    Social Network: Not on file    Her Allergies Are:  Allergies  Allergen Reactions   Latex Rash  :   Her Current Medications Are:  Outpatient Encounter Medications as of 03/01/2024  Medication Sig   acetaminophen  (TYLENOL ) 500 MG tablet Take 500-1,000 mg by mouth daily as needed for mild pain or headache.   diphenhydrAMINE  (BENADRYL ) 25 MG tablet Take 1 tablet (25 mg total) by mouth every 6 (six) hours.   metoCLOPramide  (REGLAN ) 10 MG tablet Take 1 tablet (10 mg total) by mouth every 6 (six) hours.   amoxicillin -clavulanate (AUGMENTIN ) 875-125 MG tablet Take 1 tablet by mouth every 12 (twelve) hours.   pantoprazole  (PROTONIX ) 40 MG tablet Take 1 tablet daily at least 30 minutes before first dose of Carafate . (Patient not taking: Reported on 04/19/2023)   sucralfate  (CARAFATE ) 1 g tablet Take 1 tablet (1 g total) by mouth 4 (four) times daily -  with meals and at bedtime. (Patient not taking: Reported on 04/19/2023)   No facility-administered encounter medications on file as of 03/01/2024.  : Review of Systems:  Out of a complete 14 point review of systems, all are reviewed and  negative with the exception of these symptoms as listed below:   Review of Systems  Neurological:        Pt here for headaches Pt states daily headaches Pt states nausea  and sweating while having migraines   ESS :    Objective:  Neurological Exam  Physical Exam Physical Examination:   Vitals:   03/01/24 0845  BP: 100/67  Pulse: 70    General Examination: The patient is a very pleasant 43 y.o. female in no acute distress. She appears well-developed and well-nourished and well groomed.   HEENT: Normocephalic, atraumatic, pupils are equal, round and reactive to light, funduscopic exam benign.  No photophobia.  Extraocular tracking is good without limitation to gaze excursion  or nystagmus noted. Hearing is grossly intact. Face is symmetric with normal facial animation and normal facial sensation to light touch, temperature and vibration sense. Speech is clear with no dysarthria noted. There is no hypophonia. There is no lip, neck/head, jaw or voice tremor. Neck is supple with full range of passive and active motion. There are no carotid bruits on auscultation. Oropharynx exam reveals: mild mouth dryness, adequate dental hygiene and mild airway crowding, due to small airway entry.  Tonsils absent.  Mallampati class II.  Neck circumference 15-3/8 inches. Tongue protrudes centrally and palate elevates symmetrically.  Chest: Clear to auscultation without wheezing, rhonchi or crackles noted.  Heart: S1+S2+0, regular and normal without murmurs, rubs or gallops noted.   Abdomen: Soft, non-tender and non-distended.  Extremities: There is no pitting edema in the distal lower extremities bilaterally.   Skin: Warm and dry without trophic changes noted.   Musculoskeletal: exam reveals no obvious joint deformities.   Neurologically:  Mental status: The patient is awake, alert and oriented in all 4 spheres. Her immediate and remote memory, attention, language skills and fund of knowledge are  appropriate. There is no evidence of aphasia, agnosia, apraxia or anomia. Speech is clear with normal prosody and enunciation. Thought process is linear. Mood is normal and affect is normal.  Cranial nerves II - XII are as described above under HEENT exam.  Motor exam: Normal bulk, strength and tone is noted. There is no obvious action or resting tremor.  No drift or rebound.  No postural or intention tremor. Fine motor skills and coordination: grossly intact.  Normal finger taps, hand movements and rapid alternating patting with both upper extremities, normal foot movements. Cerebellar testing: No dysmetria or intention tremor. There is no truncal or gait ataxia.  Normal finger-to-nose, normal heel-to-shin bilaterally. Sensory exam: intact to light touch, temperature and vibration sense in the upper and lower extremities.  Romberg negative. Reflexes 2+ throughout, toes are downgoing bilaterally. Gait, station and balance: She stands easily. No veering to one side is noted. No leaning to one side is noted. Posture is age-appropriate and stance is narrow based. Gait shows normal stride length and normal pace. No problems turning are noted.  Normal tandem walk.  Assessment and Plan:  In summary, Jordan Ibarra is a very pleasant 43 y.o.-year old female with an underlying medical history of reflux disease, and mild obesity, who presents for evaluation of her recurrent headaches of recent onset.  Headache description is not classic for migraines.  She has a normal neurological exam and is largely reassured today.  We talked about typical headache triggers today.  She had a CT angiogram with and without contrast of the head and neck through the emergency room not too long ago and findings were benign and she was reassured in that regard as well.  Below is a summary of my recommendation and our discussion points based on today's visit.  She was given these instructions verbally during the visit and also in  writing in a printed after visit summary as she has had trouble accessing her electronic MyChart portal:   << Please remember, common headache triggers are: sleep deprivation, dehydration, overheating, stress, hypoglycemia or skipping meals and blood sugar fluctuations, excessive pain medications or excessive alcohol use or caffeine  withdrawal. Some people have food triggers such as aged cheese, orange juice or chocolate, especially dark chocolate, or MSG (monosodium glutamate). Try to avoid these headache triggers as much possible. It may be helpful to keep a  headache diary to figure out what makes your headaches worse or brings them on and what alleviates them. Some people report headache onset after exercise but studies have shown that regular exercise may actually prevent headaches from coming. If you have exercise-induced headaches, please make sure that you drink plenty of fluid before and after exercising and that you do not over do it and do not overheat. You can use Tylenol  as needed for now. Try not to use any over the counter pain med daily, as they can perpetuate headaches.  Limit your caffeine  to 1-2 servings/day, as caffeine  can drive headaches.  I will order a home sleep test to look for signs of obstructive sleep apnea (aka OSA). As explained, the long-term risks and ramifications of untreated moderate to severe obstructive sleep apnea may include (but are not limited to): increased risk for cardiovascular disease, including congestive heart failure, stroke, difficult to control hypertension, treatment resistant obesity, arrhythmias, especially irregular heartbeat commonly known as A. Fib. (atrial fibrillation); even type 2 diabetes has been linked to untreated OSA.  For headache prevention, I suggest we try you on Elavil (generic name: amitriptyline) 25 mg: Take half a pill daily at bedtime for 2 weeks, then one pill daily at bedtime thereafter. Common side effects reported are: mouth  dryness, drowsiness, confusion, dizziness.  We will plan a follow up after your sleep test.  Establish with a new primary care, check the residents clinic at [facility] or [facility].  Get a full eye exam with any optometrist of your choosing, as strain on the eyes can cause headaches. >>  This was an extended visit of over 60 minutes with copious record review involved in considerable counseling and coordination of care, addressing multiple issues.

## 2024-03-01 NOTE — Patient Instructions (Signed)
 It was nice to meet you today.   As discussed, your headaches are likely due to a combination of factors.   Here is what we discussed today and my recommendations for you:   Please remember, common headache triggers are: sleep deprivation, dehydration, overheating, stress, hypoglycemia or skipping meals and blood sugar fluctuations, excessive pain medications or excessive alcohol use or caffeine  withdrawal. Some people have food triggers such as aged cheese, orange juice or chocolate, especially dark chocolate, or MSG (monosodium glutamate). Try to avoid these headache triggers as much possible. It may be helpful to keep a headache diary to figure out what makes your headaches worse or brings them on and what alleviates them. Some people report headache onset after exercise but studies have shown that regular exercise may actually prevent headaches from coming. If you have exercise-induced headaches, please make sure that you drink plenty of fluid before and after exercising and that you do not over do it and do not overheat. You can use Tylenol  as needed for now. Try not to use any over the counter pain med daily, as they can perpetuate headaches.  Limit your caffeine  to 1-2 servings/day, as caffeine  can drive headaches.  I will order a home sleep test to look for signs of obstructive sleep apnea (aka OSA). As explained, the long-term risks and ramifications of untreated moderate to severe obstructive sleep apnea may include (but are not limited to): increased risk for cardiovascular disease, including congestive heart failure, stroke, difficult to control hypertension, treatment resistant obesity, arrhythmias, especially irregular heartbeat commonly known as A. Fib. (atrial fibrillation); even type 2 diabetes has been linked to untreated OSA.  For headache prevention, I suggest we try you on Elavil (generic name: amitriptyline) 25 mg: Take half a pill daily at bedtime for 2 weeks, then one pill daily at  bedtime thereafter. Common side effects reported are: mouth dryness, drowsiness, confusion, dizziness.  We will plan a follow up after your sleep test.  Establish with a new primary care, check the residents clinic at [facility] or [facility].  Get a full eye exam with any optometrist of your choosing, as strain on the eyes can cause headaches.

## 2024-03-08 ENCOUNTER — Telehealth: Payer: Self-pay | Admitting: Neurology

## 2024-03-08 NOTE — Telephone Encounter (Signed)
 Left voicemail for pt to call back to schedule. Left my direct number.   self pay?

## 2024-03-29 NOTE — Telephone Encounter (Signed)
 I spoke with the patient.   [employer] self pay   Patient is scheduled at [facility] For 04/21/2024 at 8 pm.  Mailed packet to the patient.

## 2024-04-21 ENCOUNTER — Ambulatory Visit (INDEPENDENT_AMBULATORY_CARE_PROVIDER_SITE_OTHER): Payer: Self-pay | Admitting: Neurology

## 2024-04-21 DIAGNOSIS — R519 Headache, unspecified: Secondary | ICD-10-CM

## 2024-04-21 DIAGNOSIS — Z9189 Other specified personal risk factors, not elsewhere classified: Secondary | ICD-10-CM

## 2024-04-21 DIAGNOSIS — G4733 Obstructive sleep apnea (adult) (pediatric): Secondary | ICD-10-CM

## 2024-04-21 DIAGNOSIS — G4720 Circadian rhythm sleep disorder, unspecified type: Secondary | ICD-10-CM

## 2024-04-21 DIAGNOSIS — G479 Sleep disorder, unspecified: Secondary | ICD-10-CM

## 2024-04-26 ENCOUNTER — Ambulatory Visit: Payer: Self-pay | Admitting: Neurology

## 2024-04-26 DIAGNOSIS — G4733 Obstructive sleep apnea (adult) (pediatric): Secondary | ICD-10-CM

## 2024-04-26 NOTE — Procedures (Signed)
 Physician Interpretation: Please see link in Procedure Tab or under Encounters for physician report as well as technical report.      Technical Report: See separate link

## 2024-04-27 NOTE — Telephone Encounter (Signed)
 Called pt to discuss sleep study results. She was at work. She asked for call back tomorrow morning.

## 2024-04-27 NOTE — Telephone Encounter (Signed)
-----   Message from True Mar sent at 04/26/2024  5:37 PM EDT ----- Patient referred by ED for HAs, seen by me on 03/01/24, diagnostic PSG on 04/21/24.    Please call and notify the patient that the recent sleep study showed moderate obstructive sleep apnea. I recommend treatment for in the form of autoPAP. We may consider at a CPAP titration study at  a later date, if need be, which means, that we would ask her to come back in for a second sleep study with CPAP treatment. For now, I would like to start her on a so-called autoPAP machine at home,  through a DME company (of her choice, or as per insurance requirement). The DME representative will educate her on how to use the machine, how to put the mask on, etc. I have placed an order in the  chart. Please send referral, talk to patient, send report to referring MD. We will need a FU in sleep clinic for 10 weeks post-PAP set up, please arrange that with me or one of our NPs. Thanks,   True Mar, MD, PhD [facility])    ----- Message ----- From: Rebecka Fleeta Higashi In One Three One Sent: 04/26/2024   5:35 PM EDT To: True Mar, MD

## 2024-04-28 ENCOUNTER — Encounter: Payer: Self-pay | Admitting: Neurology

## 2024-04-28 NOTE — Telephone Encounter (Signed)
 Pt called back to get results , Informed Nurse will call back

## 2024-04-28 NOTE — Telephone Encounter (Signed)
 error

## 2024-04-30 ENCOUNTER — Encounter (HOSPITAL_COMMUNITY): Payer: Self-pay | Admitting: *Deleted

## 2024-04-30 ENCOUNTER — Emergency Department (HOSPITAL_COMMUNITY)
Admission: EM | Admit: 2024-04-30 | Discharge: 2024-04-30 | Disposition: A | Discharge status: Home / Self Care | Payer: Self-pay | Attending: Emergency Medicine | Admitting: Emergency Medicine

## 2024-04-30 ENCOUNTER — Other Ambulatory Visit: Payer: Self-pay

## 2024-04-30 DIAGNOSIS — B356 Tinea cruris: Secondary | ICD-10-CM | POA: Insufficient documentation

## 2024-04-30 DIAGNOSIS — Z91040 Latex allergy status: Secondary | ICD-10-CM | POA: Insufficient documentation

## 2024-04-30 LAB — URINALYSIS, ROUTINE W REFLEX MICROSCOPIC
Bilirubin Urine: NEGATIVE
Glucose, UA: NEGATIVE mg/dL
Hgb urine dipstick: NEGATIVE
Ketones, ur: NEGATIVE mg/dL
Leukocytes,Ua: NEGATIVE
Nitrite: NEGATIVE
Protein, ur: NEGATIVE mg/dL
Specific Gravity, Urine: 1.01 (ref 1.005–1.030)
pH: 7 (ref 5.0–8.0)

## 2024-04-30 LAB — WET PREP, GENITAL
Clue Cells Wet Prep HPF POC: NONE SEEN
Sperm: NONE SEEN
Trich, Wet Prep: NONE SEEN
WBC, Wet Prep HPF POC: <10 (ref <10)
Yeast Wet Prep HPF POC: NONE SEEN

## 2024-04-30 LAB — HIV ANTIBODY (ROUTINE TESTING W REFLEX): HIV Screen 4th Generation wRfx: NONREACTIVE

## 2024-04-30 MED ORDER — CLOTRIMAZOLE 1 % EX CREA: TOPICAL_CREAM | CUTANEOUS | 0 refills | Status: AC

## 2024-04-30 NOTE — Discharge Instructions (Addendum)
 As discussed, your gonorrhea, chlamydia, syphilis, and HIV results will take 1-3 days to result.  You will need to follow-up with the community clinic or with the Department of Public Health.

## 2024-04-30 NOTE — ED Provider Notes (Signed)
  EMERGENCY DEPARTMENT AT [facility] Provider Note   CSN: [id number] Arrival date & time: 04/30/24  9191     Patient presents with: Vaginal Itching   Jordan Ibarra is a 43 y.o. female with PMHx hysterectomy, GERD who presents to ED concerned for vaginal discharge x1 week. Patient stating that she was attempting to make an appointment with the department of public health, but they did not have any appointments. Patient has been using monistat x2-3 times but the white, thick discharge persisted. Patient then woke up with a rash on her vagina this morning. Patient denies recent fever, nausea, vomiting, diarrhea.     Vaginal Itching       Prior to Admission medications   Medication Sig Start Date End Date Taking? Authorizing Provider  clotrimazole  (LOTRIMIN ) 1 % cream Apply to affected area 2 times daily 04/30/24  Yes Hoy, Adilyn Humes F, PA-C  acetaminophen  (TYLENOL ) 500 MG tablet Take 500-1,000 mg by mouth daily as needed for mild pain or headache.    [provider]  amitriptyline  (ELAVIL ) 25 MG tablet Take 1 tablet (25 mg total) by mouth at bedtime. This is the final dose, follow instructions provided verbally and in after visit summary (AVS in MyChart). 03/01/24   Athar, Saima, MD  amoxicillin -clavulanate (AUGMENTIN ) 875-125 MG tablet Take 1 tablet by mouth every 12 (twelve) hours. 01/01/24   Midge Golas, MD  diphenhydrAMINE  (BENADRYL ) 25 MG tablet Take 1 tablet (25 mg total) by mouth every 6 (six) hours. 02/20/24   Jarold Olam CHRISTELLA, PA-C  metoCLOPramide  (REGLAN ) 10 MG tablet Take 1 tablet (10 mg total) by mouth every 6 (six) hours. 02/20/24   Jarold Olam CHRISTELLA, PA-C  pantoprazole  (PROTONIX ) 40 MG tablet Take 1 tablet daily at least 30 minutes before first dose of Carafate . Patient not taking: Reported on 04/19/2023 01/11/22   Molpus, Norleen, MD  sucralfate  (CARAFATE ) 1 g tablet Take 1 tablet (1 g total) by mouth 4 (four) times daily -  with meals and at  bedtime. Patient not taking: Reported on 04/19/2023 01/11/22   Molpus, Norleen, MD    Allergies: Latex    Review of Systems  Genitourinary:  Positive for vaginal discharge.    Updated Vital Signs BP 137/87 (BP Location: Left Arm)   Pulse 77   Temp 98.2 F (36.8 C) (Oral)   Resp 17   Ht 5' 5 (1.651 m)   Wt 92.1 kg   SpO2 95%   BMI 33.78 kg/m   Physical Exam Vitals and nursing note reviewed.  Constitutional:      General: She is not in acute distress.    Appearance: She is not ill-appearing or toxic-appearing.  HENT:     Head: Normocephalic and atraumatic.  Eyes:     General: No scleral icterus.       Right eye: No discharge.        Left eye: No discharge.     Conjunctiva/sclera: Conjunctivae normal.  Cardiovascular:     Rate and Rhythm: Normal rate.  Pulmonary:     Effort: Pulmonary effort is normal.  Abdominal:     General: Abdomen is flat.  Genitourinary:    Comments: Pelvic exam chaperoned by RN Franchot No friability or masses appreciated. Very minimal thin white discharge. No bleeding. External exam with very mild area of erythema in BL groin folds.   Skin:    General: Skin is warm and dry.  Neurological:     General: No focal deficit present.  Mental Status: She is alert and oriented to person, place, and time. Mental status is at baseline.  Psychiatric:        Mood and Affect: Mood normal.        Behavior: Behavior normal.     (all labs ordered are listed, but only abnormal results are displayed) Labs Reviewed  WET PREP, GENITAL  URINALYSIS, ROUTINE W REFLEX MICROSCOPIC  RPR  HIV ANTIBODY (ROUTINE TESTING W REFLEX)  GC/CHLAMYDIA PROBE AMP ([place]) NOT AT [facility]    EKG: None  Radiology: No results found.   Procedures   Medications Ordered in the ED - No data to display                                  Medical Decision Making Amount and/or Complexity of Data Reviewed Labs: ordered.   This patient presents to the ED for concern of  vaginal discharge, this involves an extensive number of treatment options, and is a complaint that carries with it a high risk of complications and morbidity.  The differential diagnosis includes STI, HIV, yeast, PID, tubo-ovarian abscess   Co morbidities that complicate the patient evaluation  Hysterectomy, GERD   Additional history obtained:  No PCP listed in chart    Problem List / ED Course / Critical interventions / Medication management  Patient presents to ED concern for vaginal discharge over the past 1 week.  Physical exam with mildly erythematous skin changes of BL groin folds.  There is also minimal white thin discharge present during pelvic exam.  Rest of physical exam reassuring.  Patient afebrile with stable vitals. I Ordered, and personally interpreted labs.  UA not concerning for infection.  Wet prep negative.  Rest of STD panel pending.  Patient understands to follow-up with this with her PCP or Department of Public Health when these result. Shared all results with patient.  Answered all questions.  Will prescribe patient fungal cream for her tinea cruris.   I have reviewed the patients home medicines and have made adjustments as needed The patient has been appropriately medically screened and/or stabilized in the ED. I have low suspicion for any other emergent medical condition which would require further screening, evaluation or treatment in the ED or require inpatient management. At time of discharge the patient is hemodynamically stable and in no acute distress. I have discussed work-up results and diagnosis with patient and answered all questions. Patient is agreeable with discharge plan. We discussed strict return precautions for returning to the emergency department and they verbalized understanding.     Social Determinants of Health:  none      Final diagnoses:  Tinea cruris    ED Discharge Orders          Ordered    clotrimazole  (LOTRIMIN ) 1 % cream         04/30/24 1033               Hoy Nidia FALCON, [place] 04/30/24 1036    Emil Share, DO 04/30/24 1045

## 2024-04-30 NOTE — ED Triage Notes (Signed)
 Pt presents with vaginal itching and ? Rash in groin area. Has tried Monistat x 4, Has tried to get appointment with the [facility], Rash noted today. Slight cream color vag discharge

## 2024-05-01 LAB — RPR: RPR Ser Ql: NONREACTIVE

## 2024-05-02 LAB — GC/CHLAMYDIA PROBE AMP (~~LOC~~) NOT AT ARMC
Chlamydia: NEGATIVE
Comment: NEGATIVE
Comment: NORMAL
Neisseria Gonorrhea: NEGATIVE

## 2024-05-02 NOTE — Telephone Encounter (Signed)
-----   Message from Nurse Maurilio PARAS sent at 04/28/2024  1:07 PM EDT -----  ----- Message ----- From: Mitchell Banco Sent: 04/28/2024  12:19 PM EDT To: [facility] Calls  ----- Message from Banco Mitchell sent at 04/28/2024 12:19 PM EDT -----

## 2024-05-02 NOTE — Telephone Encounter (Addendum)
 Pt called back.  Relayed to her the sleep study results. Moderate OSA.  Recommend autopap, and if needed CPAP titration at a later date.  Use 4 hrs or more every night. Will send order to DME who takes her insurance. She has ot insurance.  Does not make enough for good insurance and make too much for medicaid.  She will have to pay out of pocket.  I told her that [facility] or [facility] would be recommended.  She has used [facility] before, ([facility] in [place]).  I told her I sent message to DME and relay to them to call her, to give her amt that she would have to pay without insurance.

## 2024-05-04 NOTE — Telephone Encounter (Signed)
 RE: new  autopap user (no insurance) Received: Today New, Adine Neysa Nena GORMAN, RN; Joylene Carlean Sheree Leveda Viktoria Dortha Jackson Avelina; 1 other Received, thank you!     Previous Messages    ----- Message ----- From: Neysa Nena GORMAN, RN Sent: 05/02/2024   4:21 PM EDT To: Adine Joylene; Avelina Jackson; Ephraim Viktoria* Subject: new  autopap user (no insurance)              New order in epic for pt.  Pt has no insurance.  Would like to use [employer] /[employer] on [employer].  Please call with cost.   Thank you  Jordan Ibarra Female, 43 y.o., [birth year] MRN: [id number] Phone: [phone]  [facility] RN

## 2024-08-23 ENCOUNTER — Encounter (HOSPITAL_COMMUNITY): Payer: Self-pay | Admitting: *Deleted

## 2024-08-23 ENCOUNTER — Emergency Department (HOSPITAL_COMMUNITY)
Admission: EM | Admit: 2024-08-23 | Discharge: 2024-08-23 | Disposition: A | Discharge status: Home / Self Care | Payer: Self-pay | Source: Home / Self Care | Attending: Emergency Medicine | Admitting: Emergency Medicine

## 2024-08-23 ENCOUNTER — Other Ambulatory Visit: Payer: Self-pay

## 2024-08-23 DIAGNOSIS — L03115 Cellulitis of right lower limb: Secondary | ICD-10-CM

## 2024-08-23 MED ORDER — SULFAMETHOXAZOLE-TRIMETHOPRIM 800-160 MG PO TABS
1.0000 | ORAL_TABLET | Freq: Once | ORAL | Status: AC
  Administered 2024-08-23: 14:00:00 1 via ORAL
  Filled 2024-08-23: qty 1

## 2024-08-23 MED ORDER — SULFAMETHOXAZOLE-TRIMETHOPRIM 800-160 MG PO TABS: 1.0000 | ORAL_TABLET | Freq: Two times a day (BID) | ORAL | 0 refills | Status: AC

## 2024-08-23 NOTE — ED Triage Notes (Addendum)
 Here by POV from home for R lateral proximal calf insect bite with associated heat, redness, pain and swelling. No fluctuance or induration. Denies drainage, systemic sx, cramping, itching, or fever. Denies DM. First noticed last night. Rates pain 5/10. Seen by EDPA prior to triage. No meds taken or applied.

## 2024-08-23 NOTE — ED Provider Notes (Signed)
  [place] [facility]GENC[facility] DEPARTMENT AT [facility] Provider Note   CSN: [id number] Arrival date & time: 08/23/24  1313     Patient presents with: Insect Bite   Jordan Ibarra is a 43 y.o. female who presents to the emergency department with a chief complaint of leg pain and rash.  Patient states that she began to have leg pain and noticed a mild amount of redness last night that she thought was an ingrown hair because she recently shaved her legs.  Patient states that this morning she noticed that there was more redness surrounding the area so she marked it with a pen. Denies known insect bite or trauma.  Patient states that in the last 5 to 6 hours the area has grown in size.  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  Denies fever, chills or systemic symptoms.  Patient is ambulatory without assistance.  Patient does appreciate mild leg pain whenever walking.  Patient takes no prescription medications at home.   HPI     Prior to Admission medications  Medication Sig Start Date End Date Taking? Authorizing Provider  sulfamethoxazole -trimethoprim  (BACTRIM  DS) 800-160 MG tablet Take 1 tablet by mouth 2 (two) times daily for 5 days. 08/23/24 08/28/24 Yes Clyde Zarrella F, PA-C  acetaminophen  (TYLENOL ) 500 MG tablet Take 500-1,000 mg by mouth daily as needed for mild pain or headache.    [provider]  amitriptyline  (ELAVIL ) 25 MG tablet Take 1 tablet (25 mg total) by mouth at bedtime. This is the final dose, follow instructions provided verbally and in after visit summary (AVS in MyChart). 03/01/24   Athar, Saima, MD  amoxicillin -clavulanate (AUGMENTIN ) 875-125 MG tablet Take 1 tablet by mouth every 12 (twelve) hours. 01/01/24   Midge Golas, MD  clotrimazole  (LOTRIMIN ) 1 % cream Apply to affected area 2 times daily 04/30/24   Hoy Fraction F, PA-C  diphenhydrAMINE  (BENADRYL ) 25 MG tablet Take 1 tablet (25 mg total) by mouth every 6 (six) hours. 02/20/24    Jarold Olam CHRISTELLA, PA-C  metoCLOPramide  (REGLAN ) 10 MG tablet Take 1 tablet (10 mg total) by mouth every 6 (six) hours. 02/20/24   Jarold Olam CHRISTELLA, PA-C  pantoprazole  (PROTONIX ) 40 MG tablet Take 1 tablet daily at least 30 minutes before first dose of Carafate . Patient not taking: Reported on 04/19/2023 01/11/22   Molpus, Norleen, MD  sucralfate  (CARAFATE ) 1 g tablet Take 1 tablet (1 g total) by mouth 4 (four) times daily -  with meals and at bedtime. Patient not taking: Reported on 04/19/2023 01/11/22   Molpus, John, MD    Allergies: Latex    Review of Systems  Skin:  Positive for rash (Circular cellulitic rash present to right lower extremity).    Updated Vital Signs BP (!) 142/93 (BP Location: Left Arm)   Pulse 79   Temp 98.1 F (36.7 C) (Oral)   Resp 16   Wt 92.1 kg   SpO2 97%   BMI 33.78 kg/m   Physical Exam Vitals and nursing note reviewed.  Constitutional:      General: She is awake. She is not in acute distress.    Appearance: Normal appearance. She is not ill-appearing, toxic-appearing or diaphoretic.  HENT:     Head: Normocephalic and atraumatic.  Eyes:     General: No scleral icterus. Pulmonary:     Effort: Pulmonary effort is normal. No respiratory distress.  Musculoskeletal:        General: Normal range of motion.  Right lower leg: No edema.     Left lower leg: No edema.     Comments: Grossly normal range of motion of all 4 extremities including right lower extremity, lateral right lower extremity is tender to palpation surrounding area of cellulitis  Skin:    General: Skin is warm.     Capillary Refill: Capillary refill takes less than 2 seconds.     Comments: Circular erythematous rash present to lateral RLE, warm to touch, no appreciated fluctuance present, head is developing  Neurological:     General: No focal deficit present.     Mental Status: She is alert and oriented to person, place, and time.  Psychiatric:        Mood and Affect: Mood normal.         Behavior: Behavior normal. Behavior is cooperative.     (all labs ordered are listed, but only abnormal results are displayed) Labs Reviewed - No data to display  EKG: None  Radiology: No results found.   Procedures   Medications Ordered in the ED  sulfamethoxazole -trimethoprim  (BACTRIM  DS) 800-160 MG per tablet 1 tablet (1 tablet Oral Given 08/23/24 1410)                                    Medical Decision Making Risk Prescription drug management.   Patient presents to the ED for concern of right lower extremity pain, this involves an extensive number of treatment options, and is a complaint that carries with it a high risk of complications and morbidity.  The differential diagnosis includes ingrown hair, cellulitis, abscess, DVT, etc.   Co morbidities that complicate the patient evaluation  History of migraines   Medicines ordered and prescription drug management:  I ordered medication including Bactrim  for cellulitis Reevaluation of the patient after these medicines showed that the patient stayed the same I have reviewed the patients home medicines and have made adjustments as needed   Test Considered:  Lab workup: Deferred at this time as patient has no systemic symptoms, I do not believe lab workup would add to this visit at this time Imaging: Deferred at this time as infection appears well localized, no sign of extensive cellulitis or deep infection at this time   Critical Interventions:  None   Problem List / ED Course:  43 year old female, vital signs stable, presents to the emergency department for chief complaint of right lower extremity pain, patient does have history of previous MRSA infection Unknown exactly what caused right lower extremity wound however cellulitis has developed On physical exam area is warm, erythematous, and tender to touch, patient is ambulatory without systemic symptoms, do not think area is fluctuant enough for successful  incision and drainage I do not believe lab work or imaging would add anything to this encounter, will start patient on appropriate antibiotics and have her continue outpatient therapy as prescribed Will have her follow-up with her primary care provider for wound re-check in 5 to 7 days, also instructed on warm compresses During encounter head on wound actually popped and a small amount of purulent drainage was produced Return precautions given Patient discharged Most likely diagnosis at this time is cellulitis, prior to discharge did mark the outer boundary of the area with a skin pen to assess for spread  Reevaluation:  After the interventions noted above, I reevaluated the patient and found that they have :stayed the same   Social  Determinants of Health:  No PCP   Dispostion:  After consideration of the diagnostic results and the patients response to treatment, I feel that the patient would benefit from discharge and outpatient therapy as described.       Final diagnoses:  Cellulitis of right lower extremity    ED Discharge Orders          Ordered    sulfamethoxazole -trimethoprim  (BACTRIM  DS) 800-160 MG tablet  2 times daily        08/23/24 1452               Janetta Terrall FALCON, PA-C 08/23/24 1638    Doretha Folks, MD 08/24/24 1418

## 2024-08-23 NOTE — Discharge Instructions (Addendum)
 It was a pleasure taking care of you today.  Based on your history and physical exam I feel you are safe for discharge.  You have developed cellulitis on your right leg, because of this I have sent in a prescription for antibiotics to the pharmacy.  Please pick up the antibiotics and take as prescribed completing the entire course.  Please keep in mind that oral antibiotics take roughly 48 hours to reach full effect.  Because of this I would not be surprised if the redness does expand some into this timeframe.  If you experience any of the following symptoms including but not limited to fever, chills, severe right leg pain, inability to walk, severe swelling, or other concerning symptom please return to the emergency department or seek further medical care.  I do think it is a good idea for you to follow-up with your primary care provider in 5 to 7 days for a wound recheck.  If the infection persist there is a chance that this area will need to be drained if fluid develops.  Recommend over-the-counter Tylenol  for pain and warm compresses applied to the cellulitis area.
